# Patient Record
Sex: Male | Born: 1955 | Race: White | Hispanic: No | Marital: Married | Smoking: Never smoker
Health system: Southern US, Community
[De-identification: ages and names within clinical notes are randomized; demographics above are authoritative.]

## PROBLEM LIST (undated history)

## (undated) DIAGNOSIS — T7840XA Allergy, unspecified, initial encounter: Secondary | ICD-10-CM

## (undated) DIAGNOSIS — K219 Gastro-esophageal reflux disease without esophagitis: Secondary | ICD-10-CM

## (undated) DIAGNOSIS — M1990 Unspecified osteoarthritis, unspecified site: Secondary | ICD-10-CM

## (undated) DIAGNOSIS — J329 Chronic sinusitis, unspecified: Secondary | ICD-10-CM

## (undated) HISTORY — PX: JOINT REPLACEMENT: SHX530

## (undated) HISTORY — DX: Gastro-esophageal reflux disease without esophagitis: K21.9

## (undated) HISTORY — DX: Allergy, unspecified, initial encounter: T78.40XA

## (undated) HISTORY — PX: EYE SURGERY: SHX253

## (undated) HISTORY — PX: HERNIA REPAIR: SHX51

## (undated) HISTORY — DX: Unspecified osteoarthritis, unspecified site: M19.90

## (undated) HISTORY — PX: LASIK: SHX215

---

## 1978-05-26 HISTORY — PX: OTHER SURGICAL HISTORY: SHX169

## 2012-03-19 ENCOUNTER — Encounter: Payer: Self-pay | Admitting: Family Medicine

## 2012-03-19 ENCOUNTER — Ambulatory Visit (INDEPENDENT_AMBULATORY_CARE_PROVIDER_SITE_OTHER): Payer: BC Managed Care – PPO | Admitting: Family Medicine

## 2012-03-19 VITALS — BP 118/80 | HR 84 | Temp 98.5°F | Ht 70.5 in | Wt 219.0 lb

## 2012-03-19 DIAGNOSIS — Z13220 Encounter for screening for lipoid disorders: Secondary | ICD-10-CM

## 2012-03-19 DIAGNOSIS — R0982 Postnasal drip: Secondary | ICD-10-CM

## 2012-03-19 DIAGNOSIS — Z131 Encounter for screening for diabetes mellitus: Secondary | ICD-10-CM

## 2012-03-19 DIAGNOSIS — M1710 Unilateral primary osteoarthritis, unspecified knee: Secondary | ICD-10-CM

## 2012-03-19 DIAGNOSIS — M170 Bilateral primary osteoarthritis of knee: Secondary | ICD-10-CM | POA: Insufficient documentation

## 2012-03-19 DIAGNOSIS — Z7689 Persons encountering health services in other specified circumstances: Secondary | ICD-10-CM

## 2012-03-19 DIAGNOSIS — E669 Obesity, unspecified: Secondary | ICD-10-CM | POA: Insufficient documentation

## 2012-03-19 DIAGNOSIS — Z7189 Other specified counseling: Secondary | ICD-10-CM

## 2012-03-19 DIAGNOSIS — Z23 Encounter for immunization: Secondary | ICD-10-CM

## 2012-03-19 HISTORY — DX: Postnasal drip: R09.82

## 2012-03-19 HISTORY — DX: Bilateral primary osteoarthritis of knee: M17.0

## 2012-03-19 LAB — LIPID PANEL
HDL: 37.6 mg/dL — ABNORMAL LOW (ref >39.00)
Total CHOL/HDL Ratio: 5
Triglycerides: 139 mg/dL (ref 0.0–149.0)
VLDL: 27.8 mg/dL (ref 0.0–40.0)

## 2012-03-19 LAB — HEMOGLOBIN A1C: Hgb A1c MFr Bld: 5.2 % (ref 4.6–6.5)

## 2012-03-19 MED ORDER — FLUTICASONE PROPIONATE 50 MCG/ACT NA SUSP: 2.0000 | Freq: Every day | NASAL | Status: DC

## 2012-03-19 NOTE — Progress Notes (Signed)
Chief Complaint  Patient presents with  . Establish Care    HPI: Marc Krueger is here to establish care. In the process of moving here from [place].   Has the following concerns today:  L knee OA: -saw ortho in [place] and followed for years -has had multiple synvisc injections -wants referral to ortho here  Cough: -started 2-3 weeks ago -had URI/sinusitis and tx with abx - just finished -feels much better, but still has a mild cough -no heartburn or dysphagia, fevers, chills, unintentional weight loss  Exercise: 3-4 days per week Diet: eating healthy and has lost weight with diet and exercise  Wants flu vaccine and tdap.   ROS: See pertinent positives and negatives per HPI.  Past Medical History  Diagnosis Date  . Allergy   . Arthritis   . Depression     Family History  Problem Relation Age of Onset  . Arthritis Mother   . Heart disease Mother     History   Social History  . Marital Status: Married    Spouse Name: N/A    Number of Children: N/A  . Years of Education: N/A   Social History Main Topics  . Smoking status: Never Smoker   . Smokeless tobacco: None  . Alcohol Use: Yes     1 per week  . Drug Use: None  . Sexually Active: None   Other Topics Concern  . None   Social History Narrative  . None    Current outpatient prescriptions:diclofenac (VOLTAREN) 50 MG EC tablet, Take 50 mg by mouth 3 (three) times daily., Disp: , Rfl: ;  fluticasone (FLONASE) 50 MCG/ACT nasal spray, Place 2 sprays into the nose daily., Disp: 16 g, Rfl: 1;  Multiple Vitamin (MULTIVITAMIN) tablet, Take 1 tablet by mouth daily., Disp: , Rfl:   EXAM:  Filed Vitals:   03/19/12 1301  BP: 118/80  Pulse: 84  Temp: 98.5 F (36.9 C)    Body mass index is 30.98 kg/(m^2).  GENERAL: vitals reviewed and listed above, alert, oriented, appears well hydrated and in no acute distress  HEENT: atraumatic, conjunttiva clear, no obvious abnormalities on inspection of external  nose and ears, normal appearance of ear canals and TMs, clear nasal congestion, mild post oropharyngeal erythema with PND, no tonsillar edema or exudate, no sinus TTP  NECK: no obvious masses on inspection  LUNGS: clear to auscultation bilaterally, no wheezes, rales or rhonchi, good air movement  CV: HRRR, no peripheral edema  MS: moves all extremities without noticeable abnormality  PSYCH: pleasant and cooperative, no obvious depression or anxiety  ASSESSMENT AND PLAN:  Discussed the following assessment and plan:  1. Encounter to establish care with new doctor    2. Obesity (BMI 30.0-34.9)    3. Screening for diabetes mellitus  Hemoglobin A1c  4. Screening for hyperlipidemia  Lipid Panel  5. Post-nasal drip  fluticasone (FLONASE) 50 MCG/ACT nasal spray  6. Osteoarthritis of both knees  Ambulatory referral to Orthopedic Surgery   -We reviewed the PMH, PSH, FH, SH, Meds and Allergies. -We provided refills for any medications we will prescribe as needed. -We addressed current concerns per orders and patient instructions. -We have asked for records for pertinent exams, studies, vaccines and notes from previous providers. -We have advised patient to follow up per instructions below. -Influenza and tdap vaccine given today  -Patient advised to return or notify a doctor immediately if symptoms worsen or persist or new concerns arise.  Patient Instructions  -We have  ordered labs or studies at this visit. It can take up to 1-2 weeks for results and processing. We will contact you with instructions IF your results are abnormal. Normal results will be released to your [facility]. If you have not heard from [place] or can not find your results in [facility] in 2 weeks please contact our office.  -PLEASE SIGN UP FOR MYCHART TODAY   We recommend the following healthy lifestyle measures: - eat a healthy diet consisting of lots of vegetables, fruits, beans, nuts, seeds, healthy meats such as white  chicken and fish and whole grains.  - avoid fried foods, fast food, processed foods, sodas, red meet and other fattening foods.  - get a least 150 minutes of aerobic exercise per week.   Follow up in:      Kriste Basque R.

## 2012-03-19 NOTE — Patient Instructions (Addendum)
-  We have ordered labs or studies at this visit. It can take up to 1-2 weeks for results and processing. We will contact you with instructions IF your results are abnormal. Normal results will be released to your [facility]. If you have not heard from [place] or can not find your results in [facility] in 2 weeks please contact our office.  -PLEASE SIGN UP FOR MYCHART TODAY   -We placed a referral for you as discussed. It usually takes about 1-2 weeks to process and schedule this referral. If you have not heard from [place] regarding this appointment in 2 weeks please contact our office.  -for our cough:  Use flonase as directed Nasal saline 2 times daily Humidifier at night Follow up with me if not resolved in 3 weeks  We recommend the following healthy lifestyle measures: - eat a healthy diet consisting of lots of vegetables, fruits, beans, nuts, seeds, healthy meats such as white chicken and fish and whole grains.  - avoid fried foods, fast food, processed foods, sodas, red meet and other fattening foods.  - get a least 150 minutes of aerobic exercise per week.   Follow up in: April for yearly physical

## 2012-03-19 NOTE — Addendum Note (Signed)
Addended by: Azucena Freed on: 03/19/2012 01:48 PM   Modules accepted: Orders

## 2012-03-20 ENCOUNTER — Telehealth: Payer: Self-pay | Admitting: Family Medicine

## 2012-03-20 NOTE — Telephone Encounter (Signed)
Please let patient know, since not yet signed up for mychart, wanted to contact regarding lab results. Recommend signing up for mychart to see these results in about 10 days.  -cholesterol is a bit high -diabetes screening lab is a little high (>5.6) indicating a risk for developing diabetes  The best treatment to hopefully reverse these findings and prevent adverse health outcomes is a healthy diet and regular exercise.  Schedule follow up in about 3-4 months if not already scheduled to do so.  

## 2012-03-22 ENCOUNTER — Telehealth: Payer: Self-pay | Admitting: Family Medicine

## 2012-03-22 NOTE — Telephone Encounter (Signed)
Left a message for pt to return call 

## 2012-03-22 NOTE — Telephone Encounter (Signed)
Returning your call. °

## 2012-03-22 NOTE — Telephone Encounter (Signed)
Called and spoke with pt about labs.  See previous note.

## 2012-03-22 NOTE — Telephone Encounter (Signed)
Called and spoke with pt about labs and pt is aware.  

## 2012-09-02 ENCOUNTER — Encounter: Payer: Self-pay | Admitting: Family Medicine

## 2012-09-02 ENCOUNTER — Ambulatory Visit (INDEPENDENT_AMBULATORY_CARE_PROVIDER_SITE_OTHER): Payer: BC Managed Care – PPO | Admitting: Family Medicine

## 2012-09-02 VITALS — BP 128/70 | HR 103 | Temp 98.6°F | Wt 218.0 lb

## 2012-09-02 DIAGNOSIS — R0982 Postnasal drip: Secondary | ICD-10-CM

## 2012-09-02 DIAGNOSIS — IMO0002 Reserved for concepts with insufficient information to code with codable children: Secondary | ICD-10-CM

## 2012-09-02 DIAGNOSIS — M170 Bilateral primary osteoarthritis of knee: Secondary | ICD-10-CM

## 2012-09-02 DIAGNOSIS — E785 Hyperlipidemia, unspecified: Secondary | ICD-10-CM

## 2012-09-02 DIAGNOSIS — H8110 Benign paroxysmal vertigo, unspecified ear: Secondary | ICD-10-CM

## 2012-09-02 DIAGNOSIS — J309 Allergic rhinitis, unspecified: Secondary | ICD-10-CM

## 2012-09-02 DIAGNOSIS — M1710 Unilateral primary osteoarthritis, unspecified knee: Secondary | ICD-10-CM

## 2012-09-02 MED ORDER — FLUTICASONE PROPIONATE 50 MCG/ACT NA SUSP: 2.0000 | Freq: Every day | NASAL | Status: DC

## 2012-09-02 NOTE — Progress Notes (Signed)
Chief Complaint  Patient presents with  . Follow-up  . sinus pressure    HPI:  Follow up:  HLD: Lab Results  Component Value Date   CHOL 177 03/19/2012   HDL 37.60* 03/19/2012   LDLCALC 112* 03/19/2012   TRIG 139.0 03/19/2012   CHOLHDL 5 03/19/2012  -not on medications -advised diet and exercise   Obesity: -was working on diet and exercise last visit -going to the gym 3-4 days per week and doing cardio and lifting -watching diet as well  Sinus issues/Vertigo: -everyone sick at work -he has had nasal congestion and nasal itching, has restarted flonase, but not taking his claritin -dizziness only happens with certain movements of head and is brief -has past hx of BPPV treated with exercises -denies: fevers, sinus pain, chills  OA knees: -saw ortho, considering surgery   ROS: See pertinent positives and negatives per HPI.  Past Medical History  Diagnosis Date  . Allergy   . Arthritis   . Depression     Family History  Problem Relation Age of Onset  . Arthritis Mother   . Heart disease Mother     History   Social History  . Marital Status: Married    Spouse Name: N/A    Number of Children: N/A  . Years of Education: N/A   Social History Main Topics  . Smoking status: Never Smoker   . Smokeless tobacco: None  . Alcohol Use: Yes     Comment: 1 per week  . Drug Use: None  . Sexually Active: None   Other Topics Concern  . None   Social History Narrative  . None    Current outpatient prescriptions:diclofenac (VOLTAREN) 50 MG EC tablet, Take 50 mg by mouth 3 (three) times daily., Disp: , Rfl: ;  fluticasone (FLONASE) 50 MCG/ACT nasal spray, Place 2 sprays into the nose daily., Disp: 16 g, Rfl: 1;  Multiple Vitamin (MULTIVITAMIN) tablet, Take 1 tablet by mouth daily., Disp: , Rfl:   EXAM:  Filed Vitals:   09/02/12 1300  BP: 128/70  Pulse: 103  Temp: 98.6 F (37 C)    Body mass index is 30.83 kg/(m^2).  GENERAL: vitals reviewed and listed  above, alert, oriented, appears well hydrated and in no acute distress  HEENT: atraumatic, PERRLA, conjunttiva clear, no obvious abnormalities on inspection of external nose and ears, normal appearance of ear canals and TMs but with bilat clear effusion, clear nasal congestion, mild post oropharyngeal erythema with PND and cobblestoning, no tonsillar edema or exudate, no sinus TTP  NECK: no obvious masses on inspection  LUNGS: clear to auscultation bilaterally, no wheezes, rales or rhonchi, good air movement  CV: HRRR, no peripheral edema  MS: moves all extremities without noticeable abnormality  Neuro: reproduced symptoms with dix hallpike to L w/ rot nystagmus  PSYCH: pleasant and cooperative, no obvious depression or anxiety  ASSESSMENT AND PLAN:  Discussed the following assessment and plan:  BPPV (benign paroxysmal positional vertigo) - Plan: Ambulatory referral to Physical Therapy  Post-nasal drip - Plan: fluticasone (FLONASE) 50 MCG/ACT nasal spray  Osteoarthritis of both knees  Allergic rhinitis - Plan: fluticasone (FLONASE) 50 MCG/ACT nasal spray  Hyperlipemia   -continued lifestyle changes for mild HLD and obesity  - congratulated on work -flonase and claritin daily for allergies, effusion - follow up in 1 month -epley exercises and referred to PT for vestibular rehab for BPPV recurrence -follow up 1 month -Patient advised to return or notify a doctor immediately if  symptoms worsen or persist or new concerns arise.  There are no Patient Instructions on file for this visit.   Marc Benton R.

## 2012-11-24 ENCOUNTER — Encounter (HOSPITAL_COMMUNITY): Payer: Self-pay | Admitting: Pharmacy Technician

## 2012-12-01 ENCOUNTER — Other Ambulatory Visit: Payer: Self-pay | Admitting: Orthopedic Surgery

## 2012-12-02 ENCOUNTER — Encounter (HOSPITAL_COMMUNITY)
Admission: RE | Admit: 2012-12-02 | Discharge: 2012-12-02 | Disposition: A | Discharge status: Home / Self Care | Payer: BC Managed Care – PPO | Source: Ambulatory Visit | Attending: Orthopedic Surgery | Admitting: Orthopedic Surgery

## 2012-12-02 ENCOUNTER — Encounter (HOSPITAL_COMMUNITY): Payer: Self-pay

## 2012-12-02 HISTORY — DX: Chronic sinusitis, unspecified: J32.9

## 2012-12-02 LAB — BASIC METABOLIC PANEL WITH GFR
BUN: 18 mg/dL (ref 6–23)
CO2: 23 meq/L (ref 19–32)
Calcium: 9.4 mg/dL (ref 8.4–10.5)
Chloride: 104 meq/L (ref 96–112)
Creatinine, Ser: 1.16 mg/dL (ref 0.50–1.35)
GFR calc Af Amer: 80 mL/min — ABNORMAL LOW
GFR calc non Af Amer: 69 mL/min — ABNORMAL LOW
Glucose, Bld: 115 mg/dL — ABNORMAL HIGH (ref 70–99)
Potassium: 4 meq/L (ref 3.5–5.1)
Sodium: 138 meq/L (ref 135–145)

## 2012-12-02 LAB — URINALYSIS, ROUTINE W REFLEX MICROSCOPIC
Bilirubin Urine: NEGATIVE
Glucose, UA: NEGATIVE mg/dL
Hgb urine dipstick: NEGATIVE
Ketones, ur: NEGATIVE mg/dL
Protein, ur: NEGATIVE mg/dL
Urobilinogen, UA: 0.2 mg/dL (ref 0.0–1.0)

## 2012-12-02 LAB — CBC
HCT: 41.5 % (ref 39.0–52.0)
Hemoglobin: 14.6 g/dL (ref 13.0–17.0)
MCH: 31.8 pg (ref 26.0–34.0)
MCHC: 35.2 g/dL (ref 30.0–36.0)
MCV: 90.4 fL (ref 78.0–100.0)
Platelets: 250 K/uL (ref 150–400)
RBC: 4.59 MIL/uL (ref 4.22–5.81)
RDW: 12.9 % (ref 11.5–15.5)
WBC: 7.6 K/uL (ref 4.0–10.5)

## 2012-12-02 LAB — SURGICAL PCR SCREEN
MRSA, PCR: NEGATIVE
Staphylococcus aureus: NEGATIVE

## 2012-12-02 LAB — APTT: aPTT: 29 seconds (ref 24–37)

## 2012-12-02 LAB — PROTIME-INR: Prothrombin Time: 13.1 seconds (ref 11.6–15.2)

## 2012-12-02 NOTE — Patient Instructions (Addendum)
20 Hanif Radin  12/02/2012   Your procedure is scheduled on: 7-15  -2014  Report to [facility] at      1045  AM.  Call this number if you have problems the morning of surgery: [phone]  Or Presurgical Testing [phone]([facility])    Do not eat food:After Midnight.  May have clear liquids:up to 6 Hours before arrival. Nothing after :0730 AM  Clear liquids include soda, tea, black coffee, apple or grape juice, broth.  Take these medicines the morning of surgery with A SIP OF WATER: Loratadine. Flonase Spray if needed/bring.   Do not wear jewelry, make-up or nail polish.  Do not wear lotions, powders, or perfumes. You may wear deodorant.  Do not shave 12 hours prior to first CHG shower(legs and under arms).(face and neck okay.)  Do not bring valuables to the hospital.  Contacts, dentures or bridgework,body piercing,  may not be worn into surgery.  Leave suitcase in the car. After surgery it may be brought to your room.  For patients admitted to the hospital, checkout time is 11:00 AM the day of discharge.   Patients discharged the day of surgery will not be allowed to drive home. Must have responsible person with you x 24 hours once discharged.  Name and phone number of your driver: Vera-spouse [phone] cell  Special Instructions: CHG(Chlorhedine 4%-"Hibiclens","Betasept","Aplicare") Shower Use Special Wash: see special instructions.(avoid face and genitals)   Please read over the following fact sheets that you were given: MRSA Information, Blood Transfusion fact sheet, Incentive Spirometry Instruction.    Failure to follow these instructions may result in Cancellation of your surgery.   Patient signature_______________________________________________________

## 2012-12-07 ENCOUNTER — Inpatient Hospital Stay (HOSPITAL_COMMUNITY): Payer: BC Managed Care – PPO | Admitting: *Deleted

## 2012-12-07 ENCOUNTER — Inpatient Hospital Stay (HOSPITAL_COMMUNITY)
Admission: RE | Admit: 2012-12-07 | Discharge: 2012-12-09 | DRG: 209 | Disposition: A | Discharge status: Home Health | Payer: BC Managed Care – PPO | Source: Ambulatory Visit | Attending: Orthopedic Surgery | Admitting: Orthopedic Surgery

## 2012-12-07 ENCOUNTER — Encounter (HOSPITAL_COMMUNITY)
Admission: RE | Disposition: A | Discharge status: Home Health | Payer: Self-pay | Source: Ambulatory Visit | Attending: Orthopedic Surgery

## 2012-12-07 ENCOUNTER — Encounter (HOSPITAL_COMMUNITY): Payer: Self-pay | Admitting: *Deleted

## 2012-12-07 DIAGNOSIS — Z96659 Presence of unspecified artificial knee joint: Secondary | ICD-10-CM

## 2012-12-07 DIAGNOSIS — Z96652 Presence of left artificial knee joint: Secondary | ICD-10-CM

## 2012-12-07 DIAGNOSIS — Z6830 Body mass index (BMI) 30.0-30.9, adult: Secondary | ICD-10-CM

## 2012-12-07 DIAGNOSIS — M1710 Unilateral primary osteoarthritis, unspecified knee: Principal | ICD-10-CM | POA: Diagnosis present

## 2012-12-07 DIAGNOSIS — D62 Acute posthemorrhagic anemia: Secondary | ICD-10-CM | POA: Diagnosis not present

## 2012-12-07 DIAGNOSIS — E669 Obesity, unspecified: Secondary | ICD-10-CM | POA: Diagnosis present

## 2012-12-07 DIAGNOSIS — M21869 Other specified acquired deformities of unspecified lower leg: Secondary | ICD-10-CM | POA: Diagnosis present

## 2012-12-07 DIAGNOSIS — Z79899 Other long term (current) drug therapy: Secondary | ICD-10-CM

## 2012-12-07 DIAGNOSIS — D500 Iron deficiency anemia secondary to blood loss (chronic): Secondary | ICD-10-CM | POA: Diagnosis not present

## 2012-12-07 HISTORY — DX: Presence of unspecified artificial knee joint: Z96.659

## 2012-12-07 HISTORY — PX: TOTAL KNEE ARTHROPLASTY: SHX125

## 2012-12-07 LAB — TYPE AND SCREEN: ABO/RH(D): O NEG

## 2012-12-07 LAB — ABO/RH: ABO/RH(D): O NEG

## 2012-12-07 SURGERY — ARTHROPLASTY, KNEE, TOTAL
Anesthesia: General | Site: Knee | Laterality: Left | Wound class: Clean

## 2012-12-07 MED ORDER — CELECOXIB 200 MG PO CAPS
200.0000 mg | ORAL_CAPSULE | Freq: Two times a day (BID) | ORAL | Status: DC
  Administered 2012-12-07 – 2012-12-09 (×4): 200 mg via ORAL
  Filled 2012-12-07 (×5): qty 1

## 2012-12-07 MED ORDER — CHLORHEXIDINE GLUCONATE 4 % EX LIQD
60.0000 mL | Freq: Once | CUTANEOUS | Status: DC
  Filled 2012-12-07: qty 60

## 2012-12-07 MED ORDER — METHOCARBAMOL 100 MG/ML IJ SOLN
500.0000 mg | Freq: Four times a day (QID) | INTRAVENOUS | Status: DC | PRN
  Filled 2012-12-07: qty 5

## 2012-12-07 MED ORDER — ONDANSETRON HCL 4 MG/2ML IJ SOLN
INTRAMUSCULAR | Status: DC | PRN
  Administered 2012-12-07: 4 mg via INTRAVENOUS

## 2012-12-07 MED ORDER — NEOSTIGMINE METHYLSULFATE 1 MG/ML IJ SOLN
INTRAMUSCULAR | Status: DC | PRN
  Administered 2012-12-07: 3 mg via INTRAVENOUS

## 2012-12-07 MED ORDER — ZOLPIDEM TARTRATE 5 MG PO TABS: 5.0000 mg | ORAL_TABLET | Freq: Every evening | ORAL | Status: DC | PRN

## 2012-12-07 MED ORDER — FERROUS SULFATE 325 (65 FE) MG PO TABS
325.0000 mg | ORAL_TABLET | Freq: Three times a day (TID) | ORAL | Status: DC
  Administered 2012-12-07 – 2012-12-09 (×3): 325 mg via ORAL
  Filled 2012-12-07 (×8): qty 1

## 2012-12-07 MED ORDER — MIDAZOLAM HCL 5 MG/5ML IJ SOLN
INTRAMUSCULAR | Status: DC | PRN
  Administered 2012-12-07: 2 mg via INTRAVENOUS

## 2012-12-07 MED ORDER — METOCLOPRAMIDE HCL 5 MG/ML IJ SOLN: 5.0000 mg | Freq: Three times a day (TID) | INTRAMUSCULAR | Status: DC | PRN

## 2012-12-07 MED ORDER — SODIUM CHLORIDE 0.9 % IJ SOLN
INTRAMUSCULAR | Status: DC | PRN
  Administered 2012-12-07: 14 mL

## 2012-12-07 MED ORDER — HYDROMORPHONE HCL PF 1 MG/ML IJ SOLN: 0.2500 mg | INTRAMUSCULAR | Status: DC | PRN

## 2012-12-07 MED ORDER — LIDOCAINE HCL (CARDIAC) 20 MG/ML IV SOLN
INTRAVENOUS | Status: DC | PRN
  Administered 2012-12-07: 50 mg via INTRAVENOUS

## 2012-12-07 MED ORDER — HYDROMORPHONE HCL PF 1 MG/ML IJ SOLN
INTRAMUSCULAR | Status: DC | PRN
  Administered 2012-12-07 (×2): 0.5 mg via INTRAVENOUS
  Administered 2012-12-07: 1 mg via INTRAVENOUS

## 2012-12-07 MED ORDER — ONDANSETRON HCL 4 MG/2ML IJ SOLN: 4.0000 mg | Freq: Four times a day (QID) | INTRAMUSCULAR | Status: DC | PRN

## 2012-12-07 MED ORDER — LORATADINE 10 MG PO TABS
10.0000 mg | ORAL_TABLET | Freq: Every day | ORAL | Status: DC
  Administered 2012-12-07 – 2012-12-09 (×3): 10 mg via ORAL
  Filled 2012-12-07 (×3): qty 1

## 2012-12-07 MED ORDER — HYDROMORPHONE HCL PF 1 MG/ML IJ SOLN: 0.5000 mg | INTRAMUSCULAR | Status: DC | PRN

## 2012-12-07 MED ORDER — PROMETHAZINE HCL 25 MG/ML IJ SOLN: 6.2500 mg | INTRAMUSCULAR | Status: DC | PRN

## 2012-12-07 MED ORDER — PHENOL 1.4 % MT LIQD
1.0000 | OROMUCOSAL | Status: DC | PRN
  Filled 2012-12-07: qty 177

## 2012-12-07 MED ORDER — ROCURONIUM BROMIDE 100 MG/10ML IV SOLN
INTRAVENOUS | Status: DC | PRN
  Administered 2012-12-07: 50 mg via INTRAVENOUS

## 2012-12-07 MED ORDER — BUPIVACAINE LIPOSOME 1.3 % IJ SUSP
INTRAMUSCULAR | Status: DC | PRN
  Administered 2012-12-07: 20 mL

## 2012-12-07 MED ORDER — ONDANSETRON HCL 4 MG PO TABS: 4.0000 mg | ORAL_TABLET | Freq: Four times a day (QID) | ORAL | Status: DC | PRN

## 2012-12-07 MED ORDER — CEFAZOLIN SODIUM-DEXTROSE 2-3 GM-% IV SOLR
2.0000 g | INTRAVENOUS | Status: AC
  Administered 2012-12-07: 2 g via INTRAVENOUS

## 2012-12-07 MED ORDER — METOCLOPRAMIDE HCL 10 MG PO TABS: 5.0000 mg | ORAL_TABLET | Freq: Three times a day (TID) | ORAL | Status: DC | PRN

## 2012-12-07 MED ORDER — KETOROLAC TROMETHAMINE 30 MG/ML IJ SOLN
INTRAMUSCULAR | Status: DC | PRN
  Administered 2012-12-07: 30 mg

## 2012-12-07 MED ORDER — BIOTENE DRY MOUTH MT LIQD
15.0000 mL | Freq: Two times a day (BID) | OROMUCOSAL | Status: DC
  Administered 2012-12-08: 15 mL via OROMUCOSAL

## 2012-12-07 MED ORDER — MENTHOL 3 MG MT LOZG
1.0000 | LOZENGE | OROMUCOSAL | Status: DC | PRN
  Filled 2012-12-07: qty 9

## 2012-12-07 MED ORDER — BUPIVACAINE-EPINEPHRINE 0.25% -1:200000 IJ SOLN
INTRAMUSCULAR | Status: DC | PRN
  Administered 2012-12-07: 25 mL

## 2012-12-07 MED ORDER — ASPIRIN EC 325 MG PO TBEC
325.0000 mg | DELAYED_RELEASE_TABLET | Freq: Two times a day (BID) | ORAL | Status: DC
  Administered 2012-12-08 – 2012-12-09 (×3): 325 mg via ORAL
  Filled 2012-12-07 (×5): qty 1

## 2012-12-07 MED ORDER — POLYETHYLENE GLYCOL 3350 17 G PO PACK
17.0000 g | PACK | Freq: Two times a day (BID) | ORAL | Status: DC
  Administered 2012-12-08 (×2): 17 g via ORAL

## 2012-12-07 MED ORDER — HYDROCODONE-ACETAMINOPHEN 7.5-325 MG PO TABS
1.0000 | ORAL_TABLET | ORAL | Status: DC
  Administered 2012-12-07 – 2012-12-09 (×11): 2 via ORAL
  Filled 2012-12-07 (×11): qty 2

## 2012-12-07 MED ORDER — TRANEXAMIC ACID 100 MG/ML IV SOLN
1000.0000 mg | INTRAVENOUS | Status: AC
  Administered 2012-12-07: 1000 mg via INTRAVENOUS
  Filled 2012-12-07: qty 10

## 2012-12-07 MED ORDER — FENTANYL CITRATE 0.05 MG/ML IJ SOLN
INTRAMUSCULAR | Status: DC | PRN
  Administered 2012-12-07 (×6): 50 ug via INTRAVENOUS
  Administered 2012-12-07 (×2): 100 ug via INTRAVENOUS

## 2012-12-07 MED ORDER — BUPIVACAINE LIPOSOME 1.3 % IJ SUSP
20.0000 mL | Freq: Once | INTRAMUSCULAR | Status: DC
  Filled 2012-12-07: qty 20

## 2012-12-07 MED ORDER — BISACODYL 10 MG RE SUPP: 10.0000 mg | Freq: Every day | RECTAL | Status: DC | PRN

## 2012-12-07 MED ORDER — METHOCARBAMOL 500 MG PO TABS
500.0000 mg | ORAL_TABLET | Freq: Four times a day (QID) | ORAL | Status: DC | PRN
  Administered 2012-12-07 – 2012-12-09 (×5): 500 mg via ORAL
  Filled 2012-12-07 (×5): qty 1

## 2012-12-07 MED ORDER — DEXAMETHASONE SODIUM PHOSPHATE 10 MG/ML IJ SOLN
10.0000 mg | Freq: Once | INTRAMUSCULAR | Status: AC
Start: 2012-12-08 — End: 2012-12-08
  Administered 2012-12-08: 10 mg via INTRAVENOUS
  Filled 2012-12-07: qty 1

## 2012-12-07 MED ORDER — FLUTICASONE PROPIONATE 50 MCG/ACT NA SUSP
2.0000 | Freq: Every day | NASAL | Status: DC
  Filled 2012-12-07: qty 16

## 2012-12-07 MED ORDER — DOCUSATE SODIUM 100 MG PO CAPS
100.0000 mg | ORAL_CAPSULE | Freq: Two times a day (BID) | ORAL | Status: DC
  Administered 2012-12-07 – 2012-12-09 (×4): 100 mg via ORAL

## 2012-12-07 MED ORDER — PROPOFOL 10 MG/ML IV BOLUS
INTRAVENOUS | Status: DC | PRN
  Administered 2012-12-07: 200 mg via INTRAVENOUS

## 2012-12-07 MED ORDER — LACTATED RINGERS IV SOLN
INTRAVENOUS | Status: DC
  Administered 2012-12-07 (×3): via INTRAVENOUS

## 2012-12-07 MED ORDER — GLYCOPYRROLATE 0.2 MG/ML IJ SOLN
INTRAMUSCULAR | Status: DC | PRN
  Administered 2012-12-07: 0.4 mg via INTRAVENOUS

## 2012-12-07 MED ORDER — SODIUM CHLORIDE 0.9 % IV SOLN
INTRAVENOUS | Status: DC
  Administered 2012-12-08: 01:00:00 via INTRAVENOUS
  Filled 2012-12-07 (×6): qty 1000

## 2012-12-07 MED ORDER — DEXAMETHASONE SODIUM PHOSPHATE 10 MG/ML IJ SOLN
INTRAMUSCULAR | Status: DC | PRN
  Administered 2012-12-07: 10 mg via INTRAVENOUS

## 2012-12-07 MED ORDER — CEFAZOLIN SODIUM-DEXTROSE 2-3 GM-% IV SOLR
2.0000 g | Freq: Four times a day (QID) | INTRAVENOUS | Status: AC
  Administered 2012-12-07 – 2012-12-08 (×2): 2 g via INTRAVENOUS
  Filled 2012-12-07 (×2): qty 50

## 2012-12-07 MED ORDER — ALUM & MAG HYDROXIDE-SIMETH 200-200-20 MG/5ML PO SUSP: 30.0000 mL | ORAL | Status: DC | PRN

## 2012-12-07 MED ORDER — FLEET ENEMA 7-19 GM/118ML RE ENEM: 1.0000 | ENEMA | Freq: Once | RECTAL | Status: AC | PRN

## 2012-12-07 MED ORDER — ROPIVACAINE HCL 5 MG/ML IJ SOLN
INTRAMUSCULAR | Status: DC | PRN
  Administered 2012-12-07: 30 mL

## 2012-12-07 MED ORDER — DIPHENHYDRAMINE HCL 25 MG PO CAPS: 25.0000 mg | ORAL_CAPSULE | Freq: Four times a day (QID) | ORAL | Status: DC | PRN

## 2012-12-07 SURGICAL SUPPLY — 58 items
BAG ZIPLOCK 12X15 (MISCELLANEOUS) ×2 IMPLANT
BANDAGE ELASTIC 6 VELCRO ST LF (GAUZE/BANDAGES/DRESSINGS) ×2 IMPLANT
BANDAGE ESMARK 6X9 LF (GAUZE/BANDAGES/DRESSINGS) ×1 IMPLANT
BLADE SAW SGTL 13.0X1.19X90.0M (BLADE) ×2 IMPLANT
BNDG ESMARK 6X9 LF (GAUZE/BANDAGES/DRESSINGS) ×2
BOWL SMART MIX CTS (DISPOSABLE) ×2 IMPLANT
CAP UPCHARGE REVISION TRAY ×2 IMPLANT
CAPT RP KNEE ×2 IMPLANT
CEMENT HV SMART SET (Cement) ×4 IMPLANT
CLOTH BEACON ORANGE TIMEOUT ST (SAFETY) ×2 IMPLANT
CUFF TOURN SGL QUICK 34 (TOURNIQUET CUFF) ×1
CUFF TRNQT CYL 34X4X40X1 (TOURNIQUET CUFF) ×1 IMPLANT
DECANTER SPIKE VIAL GLASS SM (MISCELLANEOUS) ×2 IMPLANT
DERMABOND ADVANCED (GAUZE/BANDAGES/DRESSINGS) ×1
DERMABOND ADVANCED .7 DNX12 (GAUZE/BANDAGES/DRESSINGS) ×1 IMPLANT
DRAPE EXTREMITY T 121X128X90 (DRAPE) ×2 IMPLANT
DRAPE POUCH INSTRU U-SHP 10X18 (DRAPES) ×2 IMPLANT
DRAPE U-SHAPE 47X51 STRL (DRAPES) ×2 IMPLANT
DRSG AQUACEL AG ADV 3.5X10 (GAUZE/BANDAGES/DRESSINGS) ×2 IMPLANT
DRSG TEGADERM 4X4.75 (GAUZE/BANDAGES/DRESSINGS) ×2 IMPLANT
DURAPREP 26ML APPLICATOR (WOUND CARE) ×2 IMPLANT
ELECT REM PT RETURN 9FT ADLT (ELECTROSURGICAL) ×2
ELECTRODE REM PT RTRN 9FT ADLT (ELECTROSURGICAL) ×1 IMPLANT
EVACUATOR 1/8 PVC DRAIN (DRAIN) ×2 IMPLANT
FACESHIELD LNG OPTICON STERILE (SAFETY) ×12 IMPLANT
FEMUR LFT TC3 REVISION (Knees) ×2 IMPLANT
GAUZE SPONGE 2X2 8PLY STRL LF (GAUZE/BANDAGES/DRESSINGS) ×1 IMPLANT
GLOVE BIOGEL PI IND STRL 7.5 (GLOVE) ×1 IMPLANT
GLOVE BIOGEL PI IND STRL 8 (GLOVE) ×1 IMPLANT
GLOVE BIOGEL PI INDICATOR 7.5 (GLOVE) ×1
GLOVE BIOGEL PI INDICATOR 8 (GLOVE) ×1
GLOVE ECLIPSE 8.0 STRL XLNG CF (GLOVE) ×2 IMPLANT
GLOVE ORTHO TXT STRL SZ7.5 (GLOVE) ×4 IMPLANT
GOWN BRE IMP PREV XXLGXLNG (GOWN DISPOSABLE) ×6 IMPLANT
GOWN STRL NON-REIN LRG LVL3 (GOWN DISPOSABLE) ×2 IMPLANT
HANDPIECE INTERPULSE COAX TIP (DISPOSABLE) ×1
INSERT TIBIAL TC3 RP SZ5 12.5 (Knees) ×2 IMPLANT
KIT BASIN OR (CUSTOM PROCEDURE TRAY) ×2 IMPLANT
MANIFOLD NEPTUNE II (INSTRUMENTS) ×2 IMPLANT
NDL SAFETY ECLIPSE 18X1.5 (NEEDLE) ×1 IMPLANT
NEEDLE HYPO 18GX1.5 SHARP (NEEDLE) ×1
NS IRRIG 1000ML POUR BTL (IV SOLUTION) ×2 IMPLANT
PACK TOTAL JOINT (CUSTOM PROCEDURE TRAY) ×2 IMPLANT
POSITIONER SURGICAL ARM (MISCELLANEOUS) ×2 IMPLANT
SET HNDPC FAN SPRY TIP SCT (DISPOSABLE) ×1 IMPLANT
SET PAD KNEE POSITIONER (MISCELLANEOUS) ×2 IMPLANT
SPONGE GAUZE 2X2 STER 10/PKG (GAUZE/BANDAGES/DRESSINGS) ×1
SUCTION FRAZIER 12FR DISP (SUCTIONS) ×2 IMPLANT
SUT MNCRL AB 4-0 PS2 18 (SUTURE) ×2 IMPLANT
SUT VIC AB 1 CT1 36 (SUTURE) ×2 IMPLANT
SUT VIC AB 2-0 CT1 27 (SUTURE) ×3
SUT VIC AB 2-0 CT1 TAPERPNT 27 (SUTURE) ×3 IMPLANT
SUT VLOC 180 0 24IN GS25 (SUTURE) ×2 IMPLANT
SYR 50ML LL SCALE MARK (SYRINGE) ×2 IMPLANT
TOWEL OR 17X26 10 PK STRL BLUE (TOWEL DISPOSABLE) ×4 IMPLANT
TRAY FOLEY CATH 14FRSI W/METER (CATHETERS) ×2 IMPLANT
WATER STERILE IRR 1500ML POUR (IV SOLUTION) ×2 IMPLANT
WRAP KNEE MAXI GEL POST OP (GAUZE/BANDAGES/DRESSINGS) ×2 IMPLANT

## 2012-12-07 NOTE — H&P (Signed)
TOTAL KNEE ADMISSION H&P  Patient is being admitted for left total knee arthroplasty.  Subjective:  Chief Complaint: Left knee OA / pain, instability of the LCL.  HPI: Marc Krueger, 57 y.o. male, has a history of pain and functional disability in the left knee due to arthritis and has failed non-surgical conservative treatments for greater than 12 weeks to include NSAID's and/or analgesics and activity modification.  He was originally a Dr. Shelle Iron patient, but because of instability of the LCL Dr. Charlann Boxer saw the patient.  A long discussion was had regarding the procedure and doing a more constrained knee to compensate for the instability of the LCL. A discussion was also had that he potentially may need another surgery to repair the LCL.  Onset of symptoms was gradual, starting years ago with rapidlly worsening course since that time. The patient noted no past surgery on the left knee(s).  Patient currently rates pain in the left knee(s) at 8 out of 10 with activity. Patient has worsening of pain with activity and weight bearing, pain that interferes with activities of daily living, pain with passive range of motion, crepitus and joint swelling.  Patient has evidence of periarticular osteophytes and joint space narrowing by imaging studies. There is no active signs of infection.  Risks, benefits and expectations were discussed with the patient. Patient understand the risks, benefits and expectations and wishes to proceed with surgery.   D/C Plans:   Home with HHPT  Post-op Meds:    No Rx given  Tranexamic Acid:   To be given  Decadron:    To be given  FYI:    ASA post-op   Patient Active Problem List   Diagnosis Date Noted  . Obesity (BMI 30.0-34.9) 03/19/2012  . Post-nasal drip 03/19/2012  . Osteoarthritis of both knees 03/19/2012   Past Medical History  Diagnosis Date  . Allergy [id number]    seasonal alllergy  . Arthritis     osteoarthritis-knees  . Sinusitis     occ./occ. ringing in  ears    Past Surgical History  Procedure Laterality Date  . Foot sugery Bilateral 1980  . Lasik      Prescriptions prior to admission  Medication Sig Dispense Refill  . diclofenac (VOLTAREN) 50 MG EC tablet Take 50 mg by mouth daily.       . fluticasone (FLONASE) 50 MCG/ACT nasal spray Place 2 sprays into the nose daily.  16 g  1  . loratadine (CLARITIN) 10 MG tablet Take 10 mg by mouth daily.      . Multiple Vitamin (MULTIVITAMIN) tablet Take 1 tablet by mouth daily.       No Known Allergies   History  Substance Use Topics  . Smoking status: Never Smoker   . Smokeless tobacco: Not on file  . Alcohol Use: Yes     Comment: 1 per week    Family History  Problem Relation Age of Onset  . Arthritis Mother   . Heart disease Mother      Review of Systems  Constitutional: Negative.   HENT: Negative.   Eyes: Negative.   Respiratory: Negative.   Cardiovascular: Negative.   Gastrointestinal: Negative.   Musculoskeletal: Positive for joint pain.  Skin: Negative.   Neurological: Negative.   Endo/Heme/Allergies: Positive for environmental allergies.  Psychiatric/Behavioral: Negative.     Objective:  Physical Exam  Constitutional: He is oriented to person, place, and time. He appears well-developed and well-nourished.  HENT:  Head: Normocephalic and atraumatic.  Mouth/Throat: Oropharynx is clear and moist.  Eyes: Pupils are equal, round, and reactive to light.  Neck: Neck supple. No JVD present. No tracheal deviation present. No thyromegaly present.  Cardiovascular: Normal rate, regular rhythm, normal heart sounds and intact distal pulses.   Respiratory: Effort normal and breath sounds normal. No stridor. No respiratory distress. He has no wheezes.  GI: Soft. There is no tenderness. There is no guarding.  Musculoskeletal:       Left knee: He exhibits decreased range of motion, swelling and LCL laxity. He exhibits no ecchymosis and no erythema. Tenderness found. LCL tenderness  noted. No MCL tenderness noted.  Lymphadenopathy:    He has no cervical adenopathy.  Neurological: He is alert and oriented to person, place, and time.  Skin: Skin is warm and dry.  Psychiatric: He has a normal mood and affect.    Labs:  Estimated body mass index is 30.83 kg/(m^2) as calculated from the following:   Height as of 03/19/12: 5' 10.5" (1.791 m).   Weight as of 09/02/12: 98.884 kg (218 lb).   Imaging Review Plain radiographs demonstrate severe degenerative joint disease of the left knee(s). The overall alignment is  neutral. The bone quality appears to be good for age and reported activity level.  Assessment/Plan:  End stage arthritis, left knee with LCL instability  The patient history, physical examination, clinical judgment of the provider and imaging studies are consistent with end stage degenerative joint disease of the left knee(s) and total knee arthroplasty is deemed medically necessary. The treatment options including medical management, injection therapy arthroscopy and arthroplasty were discussed at length. The risks and benefits of total knee arthroplasty were presented and reviewed. The risks due to aseptic loosening, infection, stiffness, patella tracking problems, thromboembolic complications and other imponderables were discussed. The patient acknowledged the explanation, agreed to proceed with the plan and consent was signed. Patient is being admitted for inpatient treatment for surgery, pain control, PT, OT, prophylactic antibiotics, VTE prophylaxis, progressive ambulation and ADL's and discharge planning. The patient is planning to be discharged home with home health services.     Anastasio Auerbach Hetvi Shawhan   PAC  12/07/2012, 11:12 AM

## 2012-12-07 NOTE — Anesthesia Procedure Notes (Signed)
Anesthesia Regional Block:  Femoral nerve block  Pre-Anesthetic Checklist: ,, timeout performed, Correct Patient, Correct Site, Correct Laterality, Correct Procedure, Correct Position, site marked, Risks and benefits discussed,  Surgical consent,  Pre-op evaluation,  At surgeon's request and post-op pain management  Laterality: Lower and Left  Prep: chloraprep       Needles:  Injection technique: Single-shot  Needle Type: Stimiplex          Additional Needles:  Procedures: ultrasound guided (picture in chart) and nerve stimulator Femoral nerve block  Nerve Stimulator or Paresthesia:  Response: left quadriceps, 0.6 mA,   Additional Responses:   Narrative:  Start time: 12/07/2012 12:00 PM  Performed by: Personally  Anesthesiologist: Shatonya Passon  Additional Notes: No pain on injection. No increased resistance to injection. Motor intact immediately after block. Loss of quadriceps strength at 20 minutes.   Femoral nerve block

## 2012-12-07 NOTE — Anesthesia Preprocedure Evaluation (Signed)
Anesthesia Evaluation  Patient identified by MRN, date of birth, ID band Patient awake    Reviewed: Allergy & Precautions, H&P , NPO status , Patient's Chart, lab work & pertinent test results  Airway Mallampati: II TM Distance: >3 FB Neck ROM: Full    Dental no notable dental hx.    Pulmonary neg pulmonary ROS,  breath sounds clear to auscultation  Pulmonary exam normal       Cardiovascular Exercise Tolerance: Good negative cardio ROS  Rhythm:Regular Rate:Normal     Neuro/Psych negative neurological ROS  negative psych ROS   GI/Hepatic negative GI ROS, Neg liver ROS,   Endo/Other  negative endocrine ROS  Renal/GU negative Renal ROS  negative genitourinary   Musculoskeletal negative musculoskeletal ROS (+)   Abdominal (+) + obese,   Peds negative pediatric ROS (+)  Hematology negative hematology ROS (+)   Anesthesia Other Findings   Reproductive/Obstetrics negative OB ROS                           Anesthesia Physical Anesthesia Plan  ASA: II  Anesthesia Plan: General   Post-op Pain Management:    Induction: Intravenous  Airway Management Planned:   Additional Equipment:   Intra-op Plan:   Post-operative Plan: Extubation in OR  Informed Consent: I have reviewed the patients History and Physical, chart, labs and discussed the procedure including the risks, benefits and alternatives for the proposed anesthesia with the patient or authorized representative who has indicated his/her understanding and acceptance.   Dental advisory given  Plan Discussed with: CRNA  Anesthesia Plan Comments:         Anesthesia Quick Evaluation

## 2012-12-07 NOTE — Brief Op Note (Signed)
12/07/2012  2:32 PM  PATIENT:  Marc Krueger  57 y.o. male  PRE-OPERATIVE DIAGNOSIS:   LEFT KNEE OSTEOARTHRITIS with valgus deformity  POST-OPERATIVE DIAGNOSIS:  Left Knee Osteoarthritis with valgus deformity  PROCEDURE:  Procedure(s): LEFT TOTAL KNEE ARTHROPLASTY (Left)  Depuy total knee system  SURGEON:  Surgeon(s) and Role:    * Shelda Pal, MD - Primary  PHYSICIAN ASSISTANT: Lanney Gins, PA-C  ANESTHESIA:   regional and general  EBL:  Total I/O In: 1000 [I.V.:1000] Out: 250 [Urine:150; Blood:100]  BLOOD ADMINISTERED:none  DRAINS: (1 medium) Hemovact drain(s) in the left knee with  Suction Open   LOCAL MEDICATIONS USED:  OTHER exparel combination  SPECIMEN:  No Specimen  DISPOSITION OF SPECIMEN:  N/A  COUNTS:  YES  TOURNIQUET:   Total Tourniquet Time Documented: Thigh (Left) - 52 minutes Total: Thigh (Left) - 52 minutes   DICTATION: .Other Dictation: Dictation Number [id number]  PLAN OF CARE: Admit to inpatient   PATIENT DISPOSITION:  PACU - hemodynamically stable.   Delay start of Pharmacological VTE agent (>24hrs) due to surgical blood loss or risk of bleeding: no

## 2012-12-07 NOTE — Anesthesia Postprocedure Evaluation (Signed)
  Anesthesia Post-op Note  Patient: Marc Krueger  Procedure(s) Performed: Procedure(s) (LRB): LEFT TOTAL KNEE ARTHROPLASTY (Left)  Patient Location: PACU  Anesthesia Type: GA combined with regional for post-op pain  Level of Consciousness: awake and alert   Airway and Oxygen Therapy: Patient Spontanous Breathing  Post-op Pain: mild  Post-op Assessment: Post-op Vital signs reviewed, Patient's Cardiovascular Status Stable, Respiratory Function Stable, Patent Airway and No signs of Nausea or vomiting  Last Vitals:  Filed Vitals:   12/07/12 1600  BP: 152/70  Pulse: 96  Temp:   Resp: 13    Post-op Vital Signs: stable   Complications: No apparent anesthesia complications. Moving left foot.

## 2012-12-07 NOTE — Interval H&P Note (Signed)
History and Physical Interval Note:  12/07/2012 12:29 PM  Marc Krueger  has presented today for surgery, with the diagnosis of  LEFT KNEE OSTEOARTHRITIS AND LATERAL CALETERAL LIGAMENT DISRUPTION  The various methods of treatment have been discussed with the patient and family. After consideration of risks, benefits and other options for treatment, the patient has consented to  Procedure(s): LEFT TOTAL KNEE ARTHROPLASTY (Left) as a surgical intervention .  The patient's history has been reviewed, patient examined, no change in status, stable for surgery.  I have reviewed the patient's chart and labs.  Questions were answered to the patient's satisfaction.     Shelda Pal

## 2012-12-07 NOTE — Transfer of Care (Signed)
Immediate Anesthesia Transfer of Care Note  Patient: Marc Krueger  Procedure(s) Performed: Procedure(s): LEFT TOTAL KNEE ARTHROPLASTY (Left)  Patient Location: PACU  Anesthesia Type:General  Level of Consciousness: awake, alert , sedated and patient cooperative  Airway & Oxygen Therapy: Patient Spontanous Breathing and Patient connected to face mask oxygen  Post-op Assessment: Report given to PACU RN, Post -op Vital signs reviewed and stable and Patient moving all extremities  Post vital signs: Reviewed and stable  Complications: No apparent anesthesia complications

## 2012-12-08 ENCOUNTER — Encounter (HOSPITAL_COMMUNITY): Payer: Self-pay | Admitting: Orthopedic Surgery

## 2012-12-08 DIAGNOSIS — E669 Obesity, unspecified: Secondary | ICD-10-CM | POA: Diagnosis present

## 2012-12-08 DIAGNOSIS — D500 Iron deficiency anemia secondary to blood loss (chronic): Secondary | ICD-10-CM | POA: Diagnosis not present

## 2012-12-08 LAB — BASIC METABOLIC PANEL
BUN: 21 mg/dL (ref 6–23)
Calcium: 8.9 mg/dL (ref 8.4–10.5)
Creatinine, Ser: 1.12 mg/dL (ref 0.50–1.35)
GFR calc non Af Amer: 72 mL/min — ABNORMAL LOW (ref >90)
Glucose, Bld: 153 mg/dL — ABNORMAL HIGH (ref 70–99)

## 2012-12-08 LAB — CBC
HCT: 36.7 % — ABNORMAL LOW (ref 39.0–52.0)
Hemoglobin: 12.8 g/dL — ABNORMAL LOW (ref 13.0–17.0)
MCH: 31.8 pg (ref 26.0–34.0)
MCHC: 34.9 g/dL (ref 30.0–36.0)

## 2012-12-08 MED ORDER — POLYETHYLENE GLYCOL 3350 17 G PO PACK: 17.0000 g | PACK | Freq: Two times a day (BID) | ORAL | Status: DC

## 2012-12-08 MED ORDER — HYDROCODONE-ACETAMINOPHEN 7.5-325 MG PO TABS: 1.0000 | ORAL_TABLET | ORAL | Status: DC

## 2012-12-08 MED ORDER — FERROUS SULFATE 325 (65 FE) MG PO TABS: 325.0000 mg | ORAL_TABLET | Freq: Three times a day (TID) | ORAL | Status: DC

## 2012-12-08 MED ORDER — DSS 100 MG PO CAPS: 100.0000 mg | ORAL_CAPSULE | Freq: Two times a day (BID) | ORAL | Status: DC

## 2012-12-08 MED ORDER — ASPIRIN 325 MG PO TBEC: 325.0000 mg | DELAYED_RELEASE_TABLET | Freq: Two times a day (BID) | ORAL | Status: AC

## 2012-12-08 MED ORDER — METHOCARBAMOL 500 MG PO TABS: 500.0000 mg | ORAL_TABLET | Freq: Four times a day (QID) | ORAL | Status: DC | PRN

## 2012-12-08 NOTE — Evaluation (Signed)
Physical Therapy Evaluation Patient Details Name: Marc Krueger MRN: [id number] DOB: 10-22-1955 Today's Date: 12/08/2012 Time: 4098-1191 PT Time Calculation (min): 32 min  PT Assessment / Plan / Recommendation History of Present Illness     Clinical Impression  Pt s/p L TKR presents with decreased L LE strength/ROM and post op pain limiting functional mobility   PT Assessment  Patient needs continued PT services    Follow Up Recommendations  Home health PT    Does the patient have the potential to tolerate intense rehabilitation      Barriers to Discharge        Equipment Recommendations  Rolling walker with 5" wheels    Recommendations for Other Services OT consult   Frequency 7X/week    Precautions / Restrictions Precautions Precautions: Knee;Fall Restrictions Weight Bearing Restrictions: No   Pertinent Vitals/Pain 4/10; premed, cold packs provided      Mobility  Bed Mobility Bed Mobility: Supine to Sit Supine to Sit: 4: Min assist Details for Bed Mobility Assistance: cues for sequence and min assist for L LE Transfers Transfers: Sit to Stand;Stand to Sit Sit to Stand: 4: Min assist Stand to Sit: 4: Min assist Details for Transfer Assistance: cues for transition position, use of UEs and LE managment Ambulation/Gait Ambulation/Gait Assistance: 4: Min assist Ambulation Distance (Feet): 75 Feet Assistive device: Rolling walker Ambulation/Gait Assistance Details: cues for sequence, posture, position from RW and increased heel contact R LE Gait Pattern: Step-to pattern;Antalgic;Trunk flexed    Exercises Total Joint Exercises Ankle Circles/Pumps: AROM;15 reps;Supine;Both Quad Sets: AROM;10 reps;Supine;Both Heel Slides: AAROM;Supine;15 reps;Left Straight Leg Raises: AAROM;Left;10 reps;Supine   PT Diagnosis: Difficulty walking  PT Problem List: Decreased strength;Decreased range of motion;Decreased activity tolerance;Decreased mobility;Decreased knowledge of  use of DME;Pain PT Treatment Interventions: DME instruction;Gait training;Stair training;Functional mobility training;Therapeutic activities;Therapeutic exercise;Patient/family education     PT Goals(Current goals can be found in the care plan section) Acute Rehab PT Goals Patient Stated Goal: Resume previous lifestyle with decreased pain PT Goal Formulation: With patient Time For Goal Achievement: 12/15/12 Potential to Achieve Goals: Good  Visit Information  Last PT Received On: 12/08/12 Assistance Needed: +1       Prior Functioning  Home Living Family/patient expects to be discharged to:: Private residence Living Arrangements: Spouse/significant other;Children Available Help at Discharge: Family Type of Home: House Home Access: Stairs to enter Secretary/administrator of Steps: 6 Entrance Stairs-Rails: Right;Left Home Layout: One level Home Equipment: None Prior Function Level of Independence: Independent Communication Communication: No difficulties    Cognition  Cognition Arousal/Alertness: Awake/alert Behavior During Therapy: WFL for tasks assessed/performed Overall Cognitive Status: Within Functional Limits for tasks assessed    Extremity/Trunk Assessment Upper Extremity Assessment Upper Extremity Assessment: Overall WFL for tasks assessed Lower Extremity Assessment Lower Extremity Assessment: LLE deficits/detail LLE Deficits / Details: quads 3-/5 with AAROM at knee -10 - 80   Balance    End of Session PT - End of Session Equipment Utilized During Treatment: Gait belt Activity Tolerance: Patient tolerated treatment well Patient left: in chair;with call bell/phone within reach;with family/visitor present Nurse Communication: Mobility status  GP     Marc Krueger 12/08/2012, 12:40 PM

## 2012-12-08 NOTE — Progress Notes (Signed)
Physical Therapy Treatment Patient Details Name: Gurvir Schrom MRN: [id number] DOB: 21-Jul-1955 Today's Date: 12/08/2012 Time: 4132-4401 PT Time Calculation (min): 38 min  PT Assessment / Plan / Recommendation  PT Comments     Follow Up Recommendations  Home health PT     Does the patient have the potential to tolerate intense rehabilitation     Barriers to Discharge        Equipment Recommendations  Rolling walker with 5" wheels    Recommendations for Other Services OT consult  Frequency 7X/week   Progress towards PT Goals Progress towards PT goals: Progressing toward goals  Plan Current plan remains appropriate    Precautions / Restrictions Precautions Precautions: Knee;Fall Restrictions Weight Bearing Restrictions: No Other Position/Activity Restrictions: WBAT   Pertinent Vitals/Pain 4-5/10: premed, ice pack provided    Mobility  Bed Mobility Bed Mobility: Sit to Supine Sit to Supine: 4: Min guard Details for Bed Mobility Assistance: pt self assisting L LE with R LE Transfers Transfers: Sit to Stand;Stand to Sit Sit to Stand: 4: Min guard Stand to Sit: 4: Min guard Details for Transfer Assistance: cues for transition position, use of UEs and LE managment Ambulation/Gait Ambulation/Gait Assistance: 4: Min guard Ambulation Distance (Feet): 200 Feet (and 20') Assistive device: Rolling walker Ambulation/Gait Assistance Details: cues for stride length, posture, position from RW and sequence Gait Pattern: Step-to pattern;Antalgic;Trunk flexed General Gait Details: [name]    Exercises Total Joint Exercises Ankle Circles/Pumps: AROM;15 reps;Supine;Both Quad Sets: AROM;10 reps;Supine;Both Heel Slides: AAROM;Supine;15 reps;Left Straight Leg Raises: AAROM;Left;10 reps;Supine;AROM Goniometric ROM: -12 - 90 AAROM at knee   PT Diagnosis:    PT Problem List:   PT Treatment Interventions:     PT Goals  (current goals can now be found in the care plan section) Acute Rehab PT Goals Patient Stated Goal: Resume previous lifestyle with decreased pain PT Goal Formulation: With patient Time For Goal Achievement: 12/15/12 Potential to Achieve Goals: Good  Visit Information  Last PT Received On: 12/08/12 Assistance Needed: +1 History of Present Illness: Pt admitted for L TKA    Subjective Data  Patient Stated Goal: Resume previous lifestyle with decreased pain   Cognition  Cognition Arousal/Alertness: Awake/alert Behavior During Therapy: WFL for tasks assessed/performed Overall Cognitive Status: Within Functional Limits for tasks assessed    Balance     End of Session PT - End of Session Equipment Utilized During Treatment: Gait belt Activity Tolerance: Patient tolerated treatment well Patient left: in bed;with call bell/phone within reach Nurse Communication: Mobility status   GP     Keylin Ferryman 12/08/2012, 4:26 PM

## 2012-12-08 NOTE — Progress Notes (Signed)
   Subjective: 1 Day Post-Op Procedure(s) (LRB): LEFT TOTAL KNEE ARTHROPLASTY (Left)   Patient reports pain as mild, pain well controlled. No events throughout the night. He is ready to work with PT. If he does well with PT and pain stays well controlled he can be d/c'ed home today, if not he will plan on tomorrow.   Objective:   VITALS:   Filed Vitals:   12/08/12 0501  BP: 136/79  Pulse: 69  Temp: 98.1 F (36.7 C)  Resp: 16    Neurovascular intact Dorsiflexion/Plantar flexion intact Incision: dressing C/D/I No cellulitis present Compartment soft  LABS  Recent Labs  12/08/12 0445  HGB 12.8*  HCT 36.7*  WBC 16.7*  PLT 265     Recent Labs  12/08/12 0445  NA 136  K 4.7  BUN 21  CREATININE 1.12  GLUCOSE 153*     Assessment/Plan: 1 Day Post-Op Procedure(s) (LRB): LEFT TOTAL KNEE ARTHROPLASTY (Left) HV drain d/c'ed Foley cath d/c'ed Advance diet Up with therapy D/C IV fluids Discharge home with home health Follow up in 2 weeks at [facility]. Follow up with OLIN,Litisha Guagliardo D in 2 weeks.  Contact information:  [facility] [address], [address] [place] [address] [phone]    Expected ABLA  Treated with iron and will observe  Obese (BMI 30-39.9) Estimated body mass index is 30.7 kg/(m^2) as calculated from the following:   Height as of this encounter: 5\' 11"  (1.803 m).   Weight as of this encounter: 99.791 kg (220 lb). Patient also counseled that weight may inhibit the healing process Patient counseled that losing weight will help with future health issues       Anastasio Auerbach. Kuron Docken   PAC  12/08/2012, 9:13 AM

## 2012-12-08 NOTE — Op Note (Signed)
NAME:  Marc Krueger, Marc Krueger NO.:  [id number]  MEDICAL RECORD NO.:  [id number]  LOCATION:  1601                         FACILITY:  [facility]  PHYSICIAN:  Madlyn Frankel. Charlann Boxer, M.D.  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  PROCEDURE:  Left total knee replacement.  COMPONENTS USED:  DePuy a size 5 TC3 femur, a size 4 MBT revision tray, a 12.5 TC3 insert with a 41 patellar button.  SURGEON:  Madlyn Frankel. Charlann Boxer, M.D.  ASSISTANT:  Lanney Gins, PA-C.  ANESTHESIA:  Preoperative regional femoral nerve block plus general anesthetic.  DRAINS:  One medium Hemovac.  TOURNIQUET TIME:  50 minutes at 250 mmHg.  COMPLICATIONS: None.  INDICATION FOR PROCEDURE: Marc Krueger is a 57 year old who was referred for evaluation of the left knee osteoarthritis.  He had been seen, evaluated and treated conservatively with failure to respond of conservative measures.  MRI had been ordered, which revealed concerns for lateral collateral ligament issue.  I preoperatively discussed including the possibility of combined LCL ligament reconstruction in addition to the total knee replacement versus total knee replacement and observation in vice versa.  Given this lengthy discussion, the ultimate decision was to proceed with left total knee replacement and provide some enhanced stability with a bit more constraint and if he had issues with instability laterally, then lateral collateral ligament  reconstruction can be performed.  Risks of infection, DVT, component failure, need for future surgery were all discussed and reviewed.  Consent was obtained for benefit of pain relief, able to good understand from the patient's family.  PROCEDURE IN DETAIL:  The patient was brought to the operative theater. Once adequate anesthesia, preoperative antibiotics, Ancef administered, he was positioned supine and the left thigh tourniquet was placed.  The left lower extremity was then prepped and draped in sterile fashion.  A time-out was performed to identifying the patient, planned procedure, and extremity.  The leg was exsanguinated and tourniquet elevated to 250 mmHg.  The foot was placed in [facility] leg holder.  A midline incision was made and soft tissue planes created.  Medial arthrotomy was made.  Following initial debridement of synovial tissue and intraarticular fat pad and adipose tissue exposing particularly at the lateral side on the proximal lateral tibia, attention was first directed to the patella, precut measurement was noted to be about 24-25 mm, I then resected down to about 14-15 mm and used the 41 patellar button to restore patellar height.  Leg holes were drilled and a metal shin was placed into the patella to protect the patella from cut surface from retractors and saw blades.  Attention was now directed to the femur.  The patient was noted to have advanced degenerative changes to the lateral side of the knee with exposed  bone over the lateral and trochlear regions.  The femoral canal to open the drill, then irrigated to try to prevent fat emboli.  An intramedullary rod was passed and had 5-degrees of valgus.  I resected 12 mm of bone of the distal femur based off the measured of 10 mm.  I backed off 2 more mm to resect the little of bone at the lateral side distally.  Following this resection, the tibia was subluxated anteriorly.  An extramedullary guide placed based on  AP radiographs.  I initially selected 2 mm to resect up the lateral proximal tibia, but ended up resecting 2 more additional mm due to extension gap balancing. Following the proximal tibia resection, we confirmed that the cup was perpendicular in the coronal plane as well, was checked the extension gap with 10-mm extension gap block.  Following this, we sized the femur to be a size 5.  The size 5 rotation block was then pinned into position with the C-clamp.  I then placed the 4-in-1 cutting block and the anterior, posterior and chamfer cuts were made for size 5 femur.  Then the 5 TC3 cutting block was pinned into position over the lateral aspect of the distal femur.  The TC3 box cut was then made. At this point, the tibia was subluxated anteriorly following removal of an osteophyte.  I selected the size 4 tibial tray, it was pinned in position, drilled and keeled punched an MBT revision tray.  A trail size 4 MBT tray was pinned and held in place and keel punch driven into place.  Trial reduction was now carried out with the 5 TC3 femur, 4 MBT tray with initially 10-mm insert, but then a 12.5 TC3 post insert.  With this, the knee was noted to come to full extension with stable ligaments both in extension and in flexion and the patella tracked to the trochlea without application of pressure, no evidence of any lateral tilting.  Given all of these findings, we removed all the trail components, we drilled the sclerotic bone laterally with a drill, then irrigated the knee with normal saline solution and pulse lavage.  The synovial capsule junction was injected at this point with 20 mL of Exparel combined with Marcaine, saline and Toradol.  Final components were opened.  Cement was mixed.  The final components were then cemented in position.  The knee was brought into extension with a 12.5 insert and to allow the cement to cure and compression.  Excessive cement was removed.  Once the  cement had fully cured, an excessive cement was removed throughout the knee.  The final 12.5 TC3 insert was selected in place into the knee.  The knee was re-irrigated with normal saline solution.  At the end of the case, a medium Hemovac was placed deep.  The knee was re-irrigated. Tourniquet had been let down after 50 minutes.  The extensor mechanism was then reapproximated using #1 Vicryl and 0 V-Loc.  The remainder of the wound was closed with 2-0 Vicryl and running 4-0 Monocryl.  The knee was cleaned, dry and dressed sterilely using Aquacel.  Drain site dressed separately.  The knee was then wrapped in an Ace wrap.  He was then brought to the recovery room in stable condition, extubated and tolerated the procedure well.     Madlyn Frankel Charlann Boxer, M.D.     MDO/MEDQ  D:  12/07/2012  T:  12/08/2012  Job:  [id number]

## 2012-12-08 NOTE — Evaluation (Signed)
Occupational Therapy Evaluation Patient Details Name: Marc Krueger MRN: [id number] DOB: May 18, 1956 Today's Date: 12/08/2012 Time: 4098-1191 OT Time Calculation (min): 28 min  OT Assessment / Plan / Recommendation History of present illness Pt admitted for L TKA   Clinical Impression   Pt is overall at min assist for ADL. Wife present for education. Will benefit from skilled OT services while on acute to improve ADL independence.     OT Assessment  Patient needs continued OT Services    Follow Up Recommendations  No OT follow up;Supervision/Assistance - 24 hour    Barriers to Discharge      Equipment Recommendations  3 in 1 bedside comode    Recommendations for Other Services    Frequency  Min 2X/week    Precautions / Restrictions Precautions Precautions: Knee;Fall Restrictions Weight Bearing Restrictions: No   Pertinent Vitals/Pain L calf feels "tight"    ADL  Eating/Feeding: Independent Where Assessed - Eating/Feeding: Chair Grooming: Wash/dry hands;Set up Where Assessed - Grooming: Supported sitting Upper Body Bathing: Chest;Right arm;Left arm;Abdomen;Set up Where Assessed - Upper Body Bathing: Unsupported sitting Lower Body Bathing: Minimal assistance Where Assessed - Lower Body Bathing: Supported sit to stand Upper Body Dressing: Set up Where Assessed - Upper Body Dressing: Unsupported sitting Lower Body Dressing: Minimal assistance Where Assessed - Lower Body Dressing: Supported sit to stand Toilet Transfer: Minimal assistance Toilet Transfer Method: Other (comment) (with walker into bathroom) Toilet Transfer Equipment: Raised toilet seat with arms (or 3-in-1 over toilet) Toileting - Clothing Manipulation and Hygiene: Minimal assistance Where Assessed - Glass blower/designer Manipulation and Hygiene: Standing Tub/Shower Transfer: Minimal assistance Equipment Used: Rolling walker ADL Comments: Pt able to reach down at touch L foot. Discussed which leg to start  pants over first, etc. Wife present. Practiced shower transfer and discussed use of 3in1 as shower seat initially. She practiced hands on with stabilizing RW as he stepped in and out of shower.     OT Diagnosis: Generalized weakness  OT Problem List: Decreased strength;Decreased knowledge of use of DME or AE OT Treatment Interventions: Self-care/ADL training;DME and/or AE instruction;Therapeutic activities   OT Goals(Current goals can be found in the care plan section) Acute Rehab OT Goals Patient Stated Goal: Resume previous lifestyle with decreased pain OT Goal Formulation: With patient Time For Goal Achievement: 12/15/12 Potential to Achieve Goals: Good ADL Goals Pt Will Perform Grooming: with supervision;standing Pt Will Perform Lower Body Dressing: with supervision;sit to/from stand Pt Will Transfer to Toilet: with supervision;ambulating;bedside commode Pt Will Perform Toileting - Clothing Manipulation and hygiene: with supervision;sit to/from stand Pt Will Perform Tub/Shower Transfer: with min guard assist;Shower transfer;3 in 1  Visit Information  Last OT Received On: 12/08/12 Assistance Needed: +1 History of Present Illness: Pt admitted for L TKA       Prior Functioning     Home Living Family/patient expects to be discharged to:: Private residence Living Arrangements: Spouse/significant other;Children Available Help at Discharge: Family Type of Home: House Home Access: Stairs to enter Secretary/administrator of Steps: 6 Entrance Stairs-Rails: Right;Left Home Layout: One level Home Equipment: Toilet riser Prior Function Level of Independence: Independent Communication Communication: No difficulties         Vision/Perception     Cognition  Cognition Arousal/Alertness: Awake/alert Behavior During Therapy: WFL for tasks assessed/performed Overall Cognitive Status: Within Functional Limits for tasks assessed    Extremity/Trunk Assessment Upper Extremity  Assessment Upper Extremity Assessment: Overall WFL for tasks assessed Lower Extremity Assessment Lower Extremity Assessment: LLE deficits/detail LLE  Deficits / Details: quads 3-/5 with AAROM at knee -10 - 80     Mobility  Transfers Transfers: Sit to Stand;Stand to Sit Sit to Stand: 4: Min assist;With upper extremity assist;From chair/3-in-1 Stand to Sit: 4: Min assist;With upper extremity assist;To chair/3-in-1 Details for Transfer Assistance: cues for transition position, use of UEs and LE managment        Balance     End of Session OT - End of Session Equipment Utilized During Treatment: Gait belt Activity Tolerance: Patient tolerated treatment well Patient left: in chair;with call bell/phone within reach;with family/visitor present  GO     Lennox Laity [phone] 12/08/2012, 1:01 PM

## 2012-12-08 NOTE — Care Management Note (Signed)
Cm spoke with patient at the bedside concerning discharge planning. Per pt choice [facility] to provide [facility] services upon discharge. [facility] rep Venia Minks notified. MD ordered DME present in room with patient during CM consultation. Pt's spouse to assist in home care. No other needs or barriers identified.    Roxy Manns Cerys Winget,RN,BSN [phone]

## 2012-12-08 NOTE — Progress Notes (Signed)
Discharge summary sent to payer through [facility]  

## 2012-12-09 LAB — BASIC METABOLIC PANEL
Calcium: 8.7 mg/dL (ref 8.4–10.5)
GFR calc non Af Amer: 74 mL/min — ABNORMAL LOW (ref >90)
Glucose, Bld: 145 mg/dL — ABNORMAL HIGH (ref 70–99)
Sodium: 136 mEq/L (ref 135–145)

## 2012-12-09 LAB — CBC
MCH: 31.6 pg (ref 26.0–34.0)
MCHC: 34.5 g/dL (ref 30.0–36.0)
Platelets: 266 10*3/uL (ref 150–400)
RDW: 13.1 % (ref 11.5–15.5)

## 2012-12-09 NOTE — Progress Notes (Signed)
Physical Therapy Treatment Patient Details Name: Marc Krueger MRN: [id number] DOB: 03/18/1956 Today's Date: 12/09/2012 Time: 4403-4742 PT Time Calculation (min): 60 min  PT Assessment / Plan / Recommendation  PT Comments   Reviewed stairs and home exercise program with pt and spouse.    Follow Up Recommendations  Home health PT     Does the patient have the potential to tolerate intense rehabilitation     Barriers to Discharge        Equipment Recommendations  Rolling walker with 5" wheels    Recommendations for Other Services OT consult  Frequency 7X/week   Progress towards PT Goals Progress towards PT goals: Progressing toward goals  Plan Current plan remains appropriate    Precautions / Restrictions Precautions Precautions: Knee;Fall Restrictions Weight Bearing Restrictions: No Other Position/Activity Restrictions: WBAT   Pertinent Vitals/Pain 4/10 with activity    Mobility  Bed Mobility Bed Mobility: Not assessed Supine to Sit: 5: Supervision Sit to Supine: 5: Supervision Details for Bed Mobility Assistance: pt self assisting L LE with R LE Transfers Transfers: Sit to Stand;Stand to Sit Sit to Stand: 5: Supervision Stand to Sit: 5: Supervision Details for Transfer Assistance: min cues for use of UEs to self assist Ambulation/Gait Ambulation/Gait Assistance: 4: Min guard;5: Supervision Ambulation Distance (Feet): 150 Feet (and 25) Assistive device: Rolling walker Ambulation/Gait Assistance Details: cues for posture and position from RW Gait Pattern: Step-to pattern;Step-through pattern Stairs: Yes Stairs Assistance: 4: Min assist Stairs Assistance Details (indicate cue type and reason): cues for foot/crutch placement and sequence Stair Management Technique: One rail Right;Step to pattern;Forwards;With crutches Number of Stairs: 4    Exercises Total Joint Exercises Ankle Circles/Pumps: AROM;Supine;Both;20 reps Quad Sets: AROM;Supine;Both;20 reps Heel  Slides: AAROM;Supine;Left;20 reps (educated on self assist with sheet wrapped around ankle) Straight Leg Raises: Left;Supine;AROM;20 reps Long Arc Quad: AROM;AAROM;15 reps;Seated;Both Knee Flexion: Left;10 reps;Seated;AAROM (over pressure with R LE) Goniometric ROM: AAROM at L knee -10- 95   PT Diagnosis:    PT Problem List:   PT Treatment Interventions:     PT Goals (current goals can now be found in the care plan section) Acute Rehab PT Goals Patient Stated Goal: Resume previous lifestyle with decreased pain PT Goal Formulation: With patient Time For Goal Achievement: 12/15/12 Potential to Achieve Goals: Good  Visit Information  Last PT Received On: 12/09/12 Assistance Needed: +1 History of Present Illness: Pt admitted for L TKA    Subjective Data  Subjective: [facility] called and they can't come out until Saturday. Patient Stated Goal: Resume previous lifestyle with decreased pain   Cognition  Cognition Arousal/Alertness: Awake/alert Behavior During Therapy: WFL for tasks assessed/performed Overall Cognitive Status: Within Functional Limits for tasks assessed    Balance     End of Session PT - End of Session Equipment Utilized During Treatment: Gait belt Activity Tolerance: Patient tolerated treatment well Patient left: in chair;with call bell/phone within reach Nurse Communication: Mobility status   GP     Dalyah Pla 12/09/2012, 5:44 PM

## 2012-12-09 NOTE — Progress Notes (Signed)
   Subjective: 2 Days Post-Op Procedure(s) (LRB): LEFT TOTAL KNEE ARTHROPLASTY (Left)   Patient reports pain as mild, pain controlled. No events throughout the night. Ready to be discharged home.  Objective:   VITALS:   Filed Vitals:   12/09/12 0537  BP: 120/70  Pulse: 63  Temp: 97.7 F (36.5 C)  Resp: 16    Neurovascular intact Dorsiflexion/Plantar flexion intact Incision: dressing C/D/I No cellulitis present Compartment soft  LABS  Recent Labs  12/08/12 0445 12/09/12 0420  HGB 12.8* 11.1*  HCT 36.7* 32.2*  WBC 16.7* 16.0*  PLT 265 266     Recent Labs  12/08/12 0445 12/09/12 0420  NA 136 136  K 4.7 4.3  BUN 21 22  CREATININE 1.12 1.10  GLUCOSE 153* 145*     Assessment/Plan: 2 Days Post-Op Procedure(s) (LRB): LEFT TOTAL KNEE ARTHROPLASTY (Left) Up with therapy Discharge home with home health Follow up in 2 weeks at [facility]. Follow up with OLIN,Analissa Bayless D in 2 weeks.  Contact information:  [facility] [address] [place] [address] [phone]    Expected ABLA  Treated with iron and will observe   Obese (BMI 30-39.9)  Estimated body mass index is 30.7 kg/(m^2) as calculated from the following:      Height as of this encounter: 5\' 11"  (1.803 m).      Weight as of this encounter: 99.791 kg (220 lb).  Patient also counseled that weight may inhibit the healing process  Patient counseled that losing weight will help with future health issues       Marc Krueger   PAC  12/09/2012, 2:04 PM

## 2012-12-09 NOTE — Progress Notes (Signed)
Occupational Therapy Treatment Patient Details Name: Marc Krueger MRN: [id number] DOB: [birth year] Today's Date: 12/09/2012 Time: 7253-6644 OT Time Calculation (min): 16 min  OT Assessment / Plan / Recommendation  OT comments  Pt progressing nicely.  Able to return demonstration of safe techniques for shower transfer  Follow Up Recommendations  No OT follow up;Supervision/Assistance - 24 hour    Barriers to Discharge       Equipment Recommendations  3 in 1 bedside comode    Recommendations for Other Services    Frequency Min 2X/week   Progress towards OT Goals Progress towards OT goals: Progressing toward goals  Plan Discharge plan remains appropriate    Precautions / Restrictions Precautions Precautions: Knee;Fall Restrictions Weight Bearing Restrictions: No Other Position/Activity Restrictions: WBAT   Pertinent Vitals/Pain     ADL  Tub/Shower Transfer: Insurance risk surveyor Method: Science writer: Walk in shower Equipment Used: Rolling walker Transfers/Ambulation Related to ADLs: min guard assist ADL Comments: Pt able to return demonstration of safe technique for shower transfer  onto 3in1    OT Diagnosis:    OT Problem List:   OT Treatment Interventions:     OT Goals(current goals can now be found in the care plan section) ADL Goals Pt Will Perform Grooming: with supervision;standing Pt Will Perform Lower Body Dressing: with supervision;sit to/from stand Pt Will Transfer to Toilet: with supervision;ambulating;bedside commode Pt Will Perform Toileting - Clothing Manipulation and hygiene: with supervision;sit to/from stand Pt Will Perform Tub/Shower Transfer: with min guard assist;Shower transfer;3 in 1  Visit Information  Last OT Received On: 12/09/12 Assistance Needed: +1 History of Present Illness: Pt admitted for L TKA    Subjective Data      Prior Functioning       Cognition  Cognition Arousal/Alertness:  Awake/alert Behavior During Therapy: WFL for tasks assessed/performed Overall Cognitive Status: Within Functional Limits for tasks assessed    Mobility  Bed Mobility Bed Mobility: Not assessed Transfers Transfers: Sit to Stand;Stand to Sit Sit to Stand: 5: Supervision Stand to Sit: 5: Supervision    Exercises      Balance     End of Session OT - End of Session Equipment Utilized During Treatment: Rolling walker Activity Tolerance: Patient tolerated treatment well Patient left: in chair;with call bell/phone within reach;with family/visitor present  GO     Billijo Dilling M 12/09/2012, 5:41 PM

## 2012-12-09 NOTE — Care Management Note (Signed)
    Page 1 of 2   12/09/2012     11:37:06 AM   CARE MANAGEMENT NOTE 12/09/2012  Patient:  Marc Krueger,Marc Krueger   Account Number:  [id number]  Date Initiated:  12/09/2012  Documentation initiated by:    Subjective/Objective Assessment:   57 yo male admitted s/p LEFT TOTAL KNEE ARTHROPLASTY (Left)     Action/Plan:   Home when stable   Anticipated DC Date:  12/09/2012   Anticipated DC Plan:  HOME W HOME HEALTH SERVICES      DC Planning Services  CM consult      Choice offered to / List presented to:  C-1 Patient   DME arranged  3-N-1  Levan Hurst      DME agency  [facility]     HH arranged  HH-2 PT      [facility] agency  [facility]   Status of service:  Completed, signed off Medicare Important Message given?   (If response is "NO", the following Medicare IM given date fields will be blank) Date Medicare IM given:   Date Additional Medicare IM given:    Discharge Disposition:  HOME W HOME HEALTH SERVICES  Per UR Regulation:  Reviewed for med. necessity/level of care/duration of stay  If discussed at Long Length of Stay Meetings, dates discussed:    Comments:  12/09/12 Lavel Rieman RN,BSN NCM [phone] D/C HOME W/HHPT,& RW,3N1-BROUGHT TO RM BY [facility] DME REP.[facility] REP AWARE OF D/C,& HH ORDERS.  Sharmon Leyden, RN Registered Nurse Signed CASE MANAGEMENT Care Management Note Service date: 12/08/2012 10:32 AM Cm spoke with patient at the bedside concerning discharge planning. Per pt choice [facility] to provide [facility] services upon discharge. Genevieve Norlander rep Venia Minks notified. MD ordered DME present in room with patient during CM consultation. Pt's spouse to assist in home care. No other needs or barriers identified.

## 2012-12-09 NOTE — Progress Notes (Signed)
[facility]   [facility] is providing the following services: Per CM notes, patient has DME equipment in the room.   If patient discharges after hours, please call [phone].   Marc Krueger 12/09/2012, 7:30 AM

## 2012-12-09 NOTE — Progress Notes (Signed)
Physical Therapy Treatment Patient Details Name: Marc Krueger MRN: [id number] DOB: 1956-05-22 Today's Date: 12/09/2012 Time: 2595-6387 PT Time Calculation (min): 61 min  PT Assessment / Plan / Recommendation  PT Comments     Follow Up Recommendations  Home health PT     Does the patient have the potential to tolerate intense rehabilitation     Barriers to Discharge        Equipment Recommendations  Rolling walker with 5" wheels    Recommendations for Other Services OT consult  Frequency 7X/week   Progress towards PT Goals Progress towards PT goals: Progressing toward goals  Plan Current plan remains appropriate    Precautions / Restrictions Precautions Precautions: Knee;Fall Restrictions Weight Bearing Restrictions: No Other Position/Activity Restrictions: WBAT   Pertinent Vitals/Pain 4/10; premed, cold packs provided    Mobility  Bed Mobility Bed Mobility: Supine to Sit Supine to Sit: 5: Supervision Transfers Transfers: Sit to Stand;Stand to Sit Sit to Stand: 5: Supervision Stand to Sit: 5: Supervision Details for Transfer Assistance: min cues for use of UEs to self assist Ambulation/Gait Ambulation/Gait Assistance: 4: Min guard;5: Supervision Ambulation Distance (Feet): 280 Feet Assistive device: Rolling walker Ambulation/Gait Assistance Details: cues for posture, stride length and position from RW Gait Pattern: Step-to pattern;Antalgic;Trunk flexed General Gait Details: [name] Stairs: Yes Stairs Assistance: 4: Min assist Stairs Assistance Details (indicate cue type and reason): cues for sequence and foot/crutch placement Stair Management Technique: One rail Right;Step to pattern;Forwards;With crutches Number of Stairs: 4    Exercises Total Joint Exercises Ankle Circles/Pumps: AROM;Supine;Both;20 reps Quad Sets: AROM;Supine;Both;20 reps Heel Slides: AAROM;Supine;Left;20 reps Straight Leg  Raises: Left;Supine;AROM;20 reps Long Arc Quad: AROM;AAROM;15 reps;Seated;Both   PT Diagnosis:    PT Problem List:   PT Treatment Interventions:     PT Goals (current goals can now be found in the care plan section) Acute Rehab PT Goals Patient Stated Goal: Resume previous lifestyle with decreased pain PT Goal Formulation: With patient Time For Goal Achievement: 12/15/12 Potential to Achieve Goals: Good  Visit Information  Last PT Received On: 12/09/12 Assistance Needed: +1    Subjective Data  Patient Stated Goal: Resume previous lifestyle with decreased pain   Cognition  Cognition Arousal/Alertness: Awake/alert Behavior During Therapy: WFL for tasks assessed/performed Overall Cognitive Status: Within Functional Limits for tasks assessed    Balance     End of Session PT - End of Session Equipment Utilized During Treatment: Gait belt Activity Tolerance: Patient tolerated treatment well Patient left: in chair;with call bell/phone within reach Nurse Communication: Mobility status   GP     Marc Krueger 12/09/2012, 12:33 PM

## 2012-12-09 NOTE — Progress Notes (Signed)
Discharge summary sent to payer through [facility]  

## 2012-12-13 NOTE — Discharge Summary (Signed)
Physician Discharge Summary  Patient ID: Marc Krueger MRN: [id number] DOB/AGE: April 10, 1956 57 y.o.  Admit date: 12/07/2012 Discharge date: 12/09/2012   Procedures:  Procedure(s) (LRB): LEFT TOTAL KNEE ARTHROPLASTY (Left)  Attending Physician:  Dr. Durene Romans   Admission Diagnoses:   Left knee OA / pain, instability of the LCL  Discharge Diagnoses:  Principal Problem:   S/P left TKA Active Problems:   Expected blood loss anemia   Obese  Past Medical History  Diagnosis Date  . Allergy 11-23-1012    seasonal alllergy  . Arthritis     osteoarthritis-knees  . Sinusitis     occ./occ. ringing in ears    HPI: Marc Krueger, 57 y.o. male, has a history of pain and functional disability in the left knee due to arthritis and has failed non-surgical conservative treatments for greater than 12 weeks to include NSAID's and/or analgesics and activity modification. He was originally a Dr. Shelle Iron patient, but because of instability of the LCL Dr. Charlann Boxer saw the patient. A long discussion was had regarding the procedure and doing a more constrained knee to compensate for the instability of the LCL. A discussion was also had that he potentially may need another surgery to repair the LCL. Onset of symptoms was gradual, starting years ago with rapidlly worsening course since that time. The patient noted no past surgery on the left knee(s). Patient currently rates pain in the left knee(s) at 8 out of 10 with activity. Patient has worsening of pain with activity and weight bearing, pain that interferes with activities of daily living, pain with passive range of motion, crepitus and joint swelling. Patient has evidence of periarticular osteophytes and joint space narrowing by imaging studies. There is no active signs of infection. Risks, benefits and expectations were discussed with the patient. Patient understand the risks, benefits and expectations and wishes to proceed with surgery.   PCP: Terressa Koyanagi., DO   Discharged Condition: good  Hospital Course:  Patient underwent the above stated procedure on 12/07/2012. Patient tolerated the procedure well and brought to the recovery room in good condition and subsequently to the floor.  POD #1 BP: 136/79 ; Pulse: 69 ; Temp: 98.1 F (36.7 C) ; Resp: 16 Pt's foley was removed, as well as the hemovac drain removed. IV was changed to a saline lock. Patient reports pain as mild, pain well controlled. No events throughout the night. He is ready to work with PT. Neurovascular intact, dorsiflexion/plantar flexion intact, incision: dressing C/D/I, no cellulitis present and compartment soft.   LABS  Basename    HGB  12.8  HCT  36.7   POD #2  BP: 120/70 ; Pulse: 63 ; Temp: 97.7 F (36.5 C) ; Resp: 16  Patient reports pain as mild, pain controlled. No events throughout the night. Ready to be discharged home. Neurovascular intact, dorsiflexion/plantar flexion intact, incision: dressing C/D/I, no cellulitis present and compartment soft.   LABS  Basename    HGB  11.1  HCT  32.2    Discharge Exam: General appearance: alert, cooperative and no distress Extremities: Homans sign is negative, no sign of DVT, no edema, redness or tenderness in the calves or thighs and no ulcers, gangrene or trophic changes  Disposition:    Home-Health Care Svc with follow up in 2 weeks   Follow-up Information   Follow up with Shelda Pal, MD. Schedule an appointment as soon as possible for a visit in 2 weeks.   Contact information:   [address] [place] [address] [phone]       Discharge Orders   Future Orders Complete By Expires     Call MD / Call 911  As directed     Comments:      If you experience chest pain or shortness of breath, CALL 911 and be transported to the hospital emergency room.  If you develope a fever above 101 F, pus (white drainage) or increased drainage or redness at the wound, or calf pain, call your  surgeon's office.    Change dressing  As directed     Comments:      Maintain surgical dressing for 10-14 days, then change the dressing daily with sterile 4 x 4 inch gauze dressing and tape. Keep the area dry and clean.    Constipation Prevention  As directed     Comments:      Drink plenty of fluids.  Prune juice may be helpful.  You may use a stool softener, such as Colace (over the counter) 100 mg twice a day.  Use MiraLax (over the counter) for constipation as needed.    Diet - low sodium heart healthy  As directed     Discharge instructions  As directed     Comments:      Maintain surgical dressing for 10-14 days, then replace with gauze and tape. Keep the area dry and clean until follow up. Follow up in 2 weeks at [facility]. Call with any questions or concerns.    Increase activity slowly as tolerated  As directed     TED hose  As directed     Comments:      Use stockings (TED hose) for 2 weeks on both leg(s).  You may remove them at night for sleeping.    Weight bearing as tolerated  As directed          Medication List    STOP taking these medications       diclofenac 50 MG EC tablet  Commonly known as:  VOLTAREN      TAKE these medications       aspirin 325 MG EC tablet  Take 1 tablet (325 mg total) by mouth 2 (two) times daily.     DSS 100 MG Caps  Take 100 mg by mouth 2 (two) times daily.     ferrous sulfate 325 (65 FE) MG tablet  Take 1 tablet (325 mg total) by mouth 3 (three) times daily after meals.     fluticasone 50 MCG/ACT nasal spray  Commonly known as:  FLONASE  Place 2 sprays into the nose daily.     HYDROcodone-acetaminophen 7.5-325 MG per tablet  Commonly known as:  NORCO  Take 1-2 tablets by mouth every 4 (four) hours.     loratadine 10 MG tablet  Commonly known as:  CLARITIN  Take 10 mg by mouth daily.     methocarbamol 500 MG tablet  Commonly known as:  ROBAXIN  Take 1 tablet (500 mg total) by mouth every 6 (six) hours as  needed (muscle spasms).     multivitamin tablet  Take 1 tablet by mouth daily.     polyethylene glycol packet  Commonly known as:  MIRALAX / GLYCOLAX  Take 17 g by mouth 2 (two) times daily.         Signed: Anastasio Auerbach. Deano Tomaszewski   PAC  12/13/2012, 9:24 AM

## 2013-02-16 ENCOUNTER — Encounter: Payer: Self-pay | Admitting: Family Medicine

## 2013-02-16 DIAGNOSIS — J309 Allergic rhinitis, unspecified: Secondary | ICD-10-CM

## 2013-02-16 DIAGNOSIS — R0982 Postnasal drip: Secondary | ICD-10-CM

## 2013-02-17 MED ORDER — FLUTICASONE PROPIONATE 50 MCG/ACT NA SUSP: 2.0000 | Freq: Every day | NASAL | Status: DC

## 2013-02-17 NOTE — Telephone Encounter (Signed)
Rx for flonase sent to pharmacy

## 2013-02-28 ENCOUNTER — Ambulatory Visit (INDEPENDENT_AMBULATORY_CARE_PROVIDER_SITE_OTHER): Payer: BC Managed Care – PPO | Admitting: Family

## 2013-02-28 ENCOUNTER — Encounter: Payer: Self-pay | Admitting: Family

## 2013-02-28 VITALS — BP 144/100 | HR 98 | Temp 98.5°F | Wt 237.0 lb

## 2013-02-28 DIAGNOSIS — R05 Cough: Secondary | ICD-10-CM

## 2013-02-28 DIAGNOSIS — K219 Gastro-esophageal reflux disease without esophagitis: Secondary | ICD-10-CM

## 2013-02-28 MED ORDER — HYDROCOD POLST-CHLORPHEN POLST 10-8 MG/5ML PO LQCR: 5.0000 mL | Freq: Two times a day (BID) | ORAL | Status: DC | PRN

## 2013-02-28 MED ORDER — ESOMEPRAZOLE MAGNESIUM 40 MG PO CPDR: 40.0000 mg | DELAYED_RELEASE_CAPSULE | Freq: Every day | ORAL | Status: DC

## 2013-02-28 MED ORDER — METHYLPREDNISOLONE 4 MG PO KIT: PACK | ORAL | Status: AC

## 2013-02-28 NOTE — Patient Instructions (Addendum)

## 2013-02-28 NOTE — Progress Notes (Signed)
Subjective:    Patient ID: Marc Krueger, male    DOB: 05-May-1956, 57 y.o.   MRN: [id number]  HPI 57 year old white male, nonsmoker, patient of Dr. Selena Batten presents today with complaints of a cough x1 month. Reports a tickle sensation in his throat. Has noticed these began to have wheezing at night. Reports an increase in heartburn and indigestion recently. In the past, he taken Nexium that has helped. He has been taking NyQuil for relief.  Review of Systems  Constitutional: Negative.   HENT: Positive for congestion, rhinorrhea and postnasal drip.   Respiratory: Positive for cough.   Cardiovascular: Negative.   Endocrine: Negative.   Musculoskeletal: Negative.   Skin: Negative.   Allergic/Immunologic: Negative.   Neurological: Negative.   Hematological: Negative.   Psychiatric/Behavioral: Negative.    Past Medical History  Diagnosis Date  . Allergy 11-23-1012    seasonal alllergy  . Arthritis     osteoarthritis-knees  . Sinusitis     occ./occ. ringing in ears    History   Social History  . Marital Status: Married    Spouse Name: N/A    Number of Children: N/A  . Years of Education: N/A   Occupational History  . Not on file.   Social History Main Topics  . Smoking status: Never Smoker   . Smokeless tobacco: Not on file  . Alcohol Use: Yes     Comment: 1 per week  . Drug Use: No  . Sexual Activity: Yes   Other Topics Concern  . Not on file   Social History Narrative  . No narrative on file    Past Surgical History  Procedure Laterality Date  . Foot sugery Bilateral 1980  . Lasik    . 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 Arthritis Mother   . Heart disease Mother   . Lung cancer Sister   . Arthritis Brother   . Diabetes Brother   . Arthritis Brother     No Known  Allergies  Current Outpatient Prescriptions on File Prior to Visit  Medication Sig Dispense Refill  . docusate sodium 100 MG CAPS Take 100 mg by mouth 2 (two) times daily.  10 capsule  0  . ferrous sulfate 325 (65 FE) MG tablet Take 1 tablet (325 mg total) by mouth 3 (three) times daily after meals.    3  . fluticasone (FLONASE) 50 MCG/ACT nasal spray Place 2 sprays into the nose daily.  16 g  1  . fluticasone (FLONASE) 50 MCG/ACT nasal spray Place 2 sprays into the nose daily.  16 g  1  . HYDROcodone-acetaminophen (NORCO) 7.5-325 MG per tablet Take 1-2 tablets by mouth every 4 (four) hours.  100 tablet  0  . loratadine (CLARITIN) 10 MG tablet Take 10 mg by mouth daily.      . methocarbamol (ROBAXIN) 500 MG tablet Take 1 tablet (500 mg total) by mouth every 6 (six) hours as needed (muscle spasms).  50 tablet  0  . Multiple Vitamin (MULTIVITAMIN) tablet Take 1 tablet by mouth daily.      . polyethylene glycol (MIRALAX / GLYCOLAX) packet Take 17 g by mouth 2 (two) times daily.  14 each  0   No current facility-administered medications on file prior to visit.    BP  144/100  Pulse 98  Temp(Src) 98.5 F (36.9 C) (Oral)  Wt 237 lb (107.502 kg)  BMI 33.07 kg/m2chart     Objective:   Physical Exam  Constitutional: He is oriented to person, place, and time. He appears well-developed and well-nourished.  HENT:  Right Ear: External ear normal.  Left Ear: External ear normal.  Nose: Nose normal.  Mouth/Throat: Oropharynx is clear and moist.  Neck: Normal range of motion. Neck supple.  Cardiovascular: Normal rate, regular rhythm and normal heart sounds.   Pulmonary/Chest: Effort normal and breath sounds normal. He has no wheezes.  Abdominal: Soft. Bowel sounds are normal.  Musculoskeletal: Normal range of motion.  Neurological: He is alert and oriented to person, place, and time.  Skin: Skin is warm and dry.  Psychiatric: He has a normal mood and affect.          Assessment & Plan:   Assessment: 1. Cough related to postnasal drip 2. GERD  Plan: I believe his cough is postnasal drip but there is probably not helping. Start Nexium 40 mg once daily. Medrol Dosepak as directed. Tussionex as needed for cough. Patient on the opposite symptoms worsen or persist. Recheck as scheduled, and as needed.

## 2013-03-08 ENCOUNTER — Other Ambulatory Visit: Payer: Self-pay | Admitting: Family

## 2013-03-08 MED ORDER — HYDROCOD POLST-CHLORPHEN POLST 10-8 MG/5ML PO LQCR: 5.0000 mL | Freq: Two times a day (BID) | ORAL | Status: DC | PRN

## 2013-03-11 ENCOUNTER — Encounter: Payer: Self-pay | Admitting: Family Medicine

## 2013-03-11 ENCOUNTER — Telehealth: Payer: Self-pay

## 2013-03-11 ENCOUNTER — Ambulatory Visit (INDEPENDENT_AMBULATORY_CARE_PROVIDER_SITE_OTHER): Payer: BC Managed Care – PPO | Admitting: Family Medicine

## 2013-03-11 ENCOUNTER — Ambulatory Visit (INDEPENDENT_AMBULATORY_CARE_PROVIDER_SITE_OTHER)
Admission: RE | Admit: 2013-03-11 | Discharge: 2013-03-11 | Disposition: A | Discharge status: Home / Self Care | Payer: BC Managed Care – PPO | Source: Ambulatory Visit | Attending: Family Medicine | Admitting: Family Medicine

## 2013-03-11 VITALS — BP 140/82 | HR 92 | Temp 98.0°F | Wt 233.0 lb

## 2013-03-11 DIAGNOSIS — R918 Other nonspecific abnormal finding of lung field: Secondary | ICD-10-CM

## 2013-03-11 DIAGNOSIS — Z23 Encounter for immunization: Secondary | ICD-10-CM

## 2013-03-11 DIAGNOSIS — B370 Candidal stomatitis: Secondary | ICD-10-CM

## 2013-03-11 DIAGNOSIS — R05 Cough: Secondary | ICD-10-CM

## 2013-03-11 IMAGING — CR DG CHEST 2V
2 series · 2 of 2 positions shown · non-contrast
Comparison: None.

CLINICAL DATA: Cough, wheezing.

EXAM:
CHEST  2 VIEW

[view not recorded (1 of 2)]
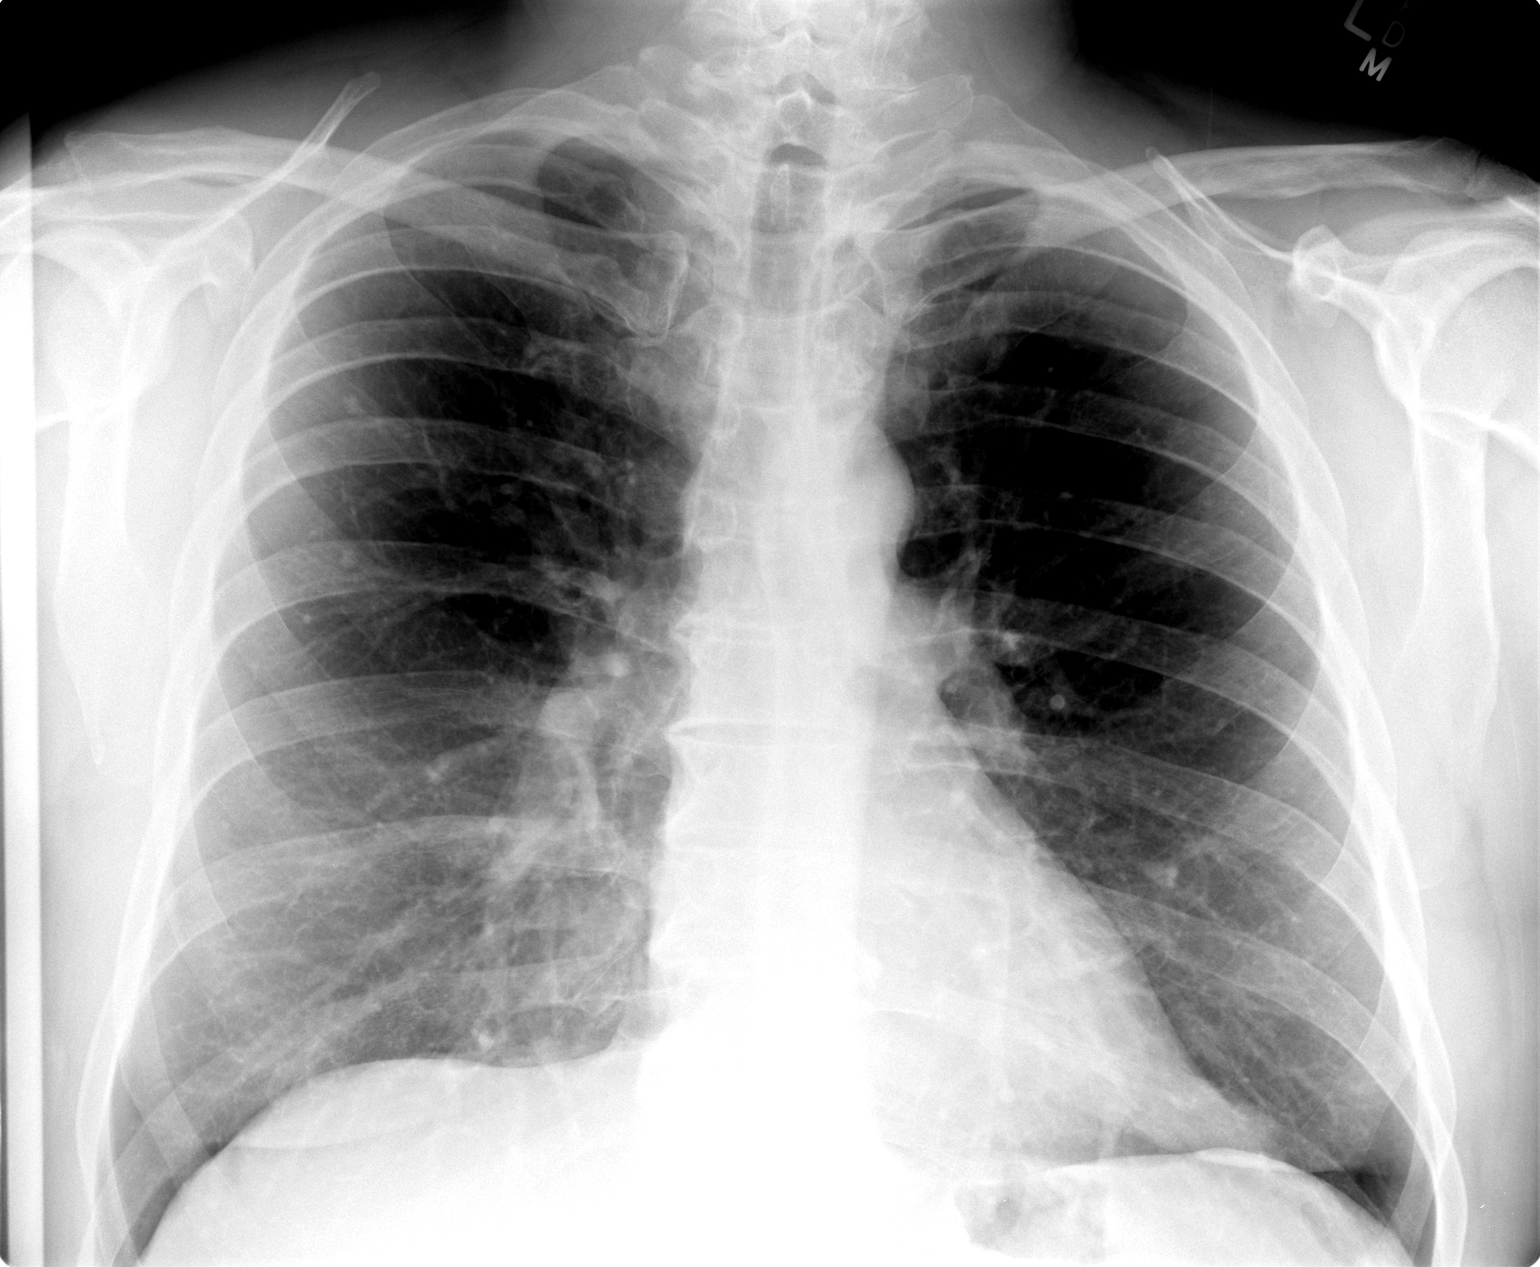

[view not recorded (2 of 2)]
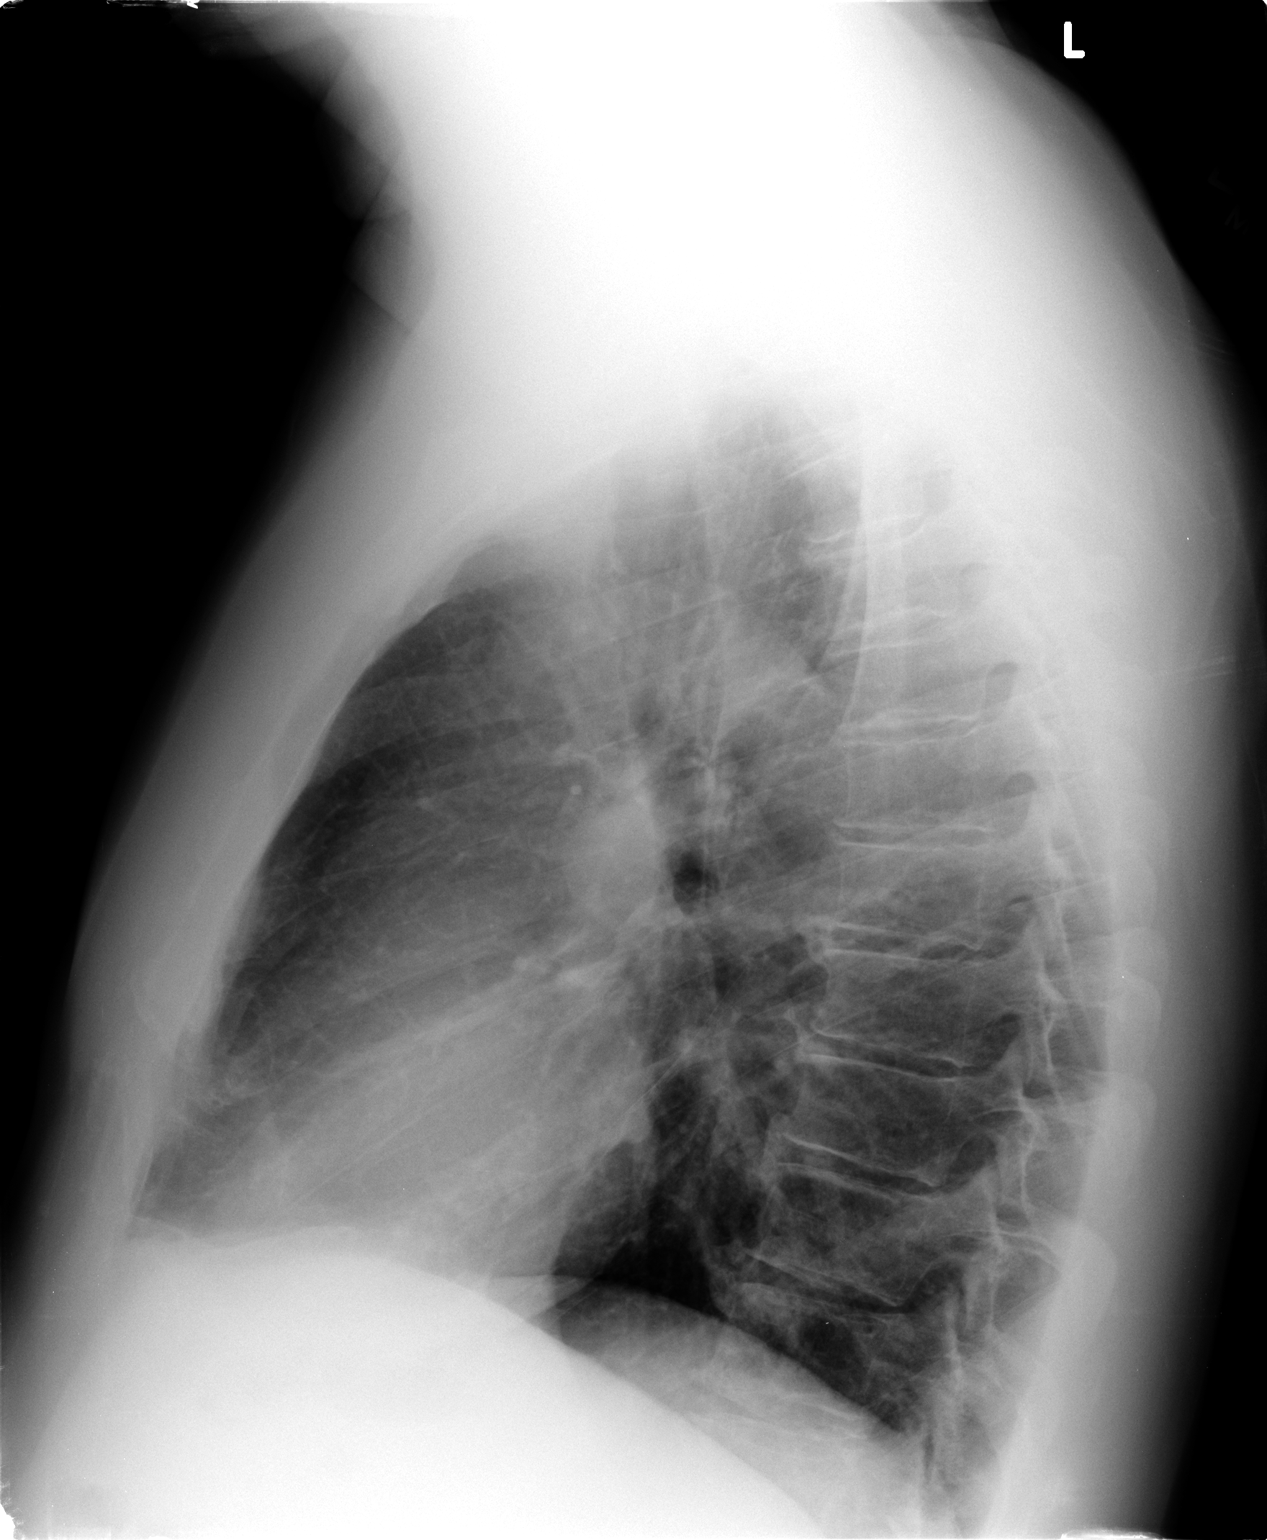

[2 of 2 positions shown; findings below may reference images not displayed]

FINDINGS: Normal cardiac and mediastinal contours. No consolidative pulmonary
opacities. There are multiple bilateral nodular densities with the
largest in the left mid to lower lung measuring 9 mm and the right
upper lung measuring 7 mm. No pleural effusion or pneumothorax. No
aggressive appearing osseous lesions.
IMPRESSION: 1. No acute cardiopulmonary process.
2. Bilateral pulmonary nodular densities. Further evaluation with
chest CT is recommended.
These results will be called to the ordering clinician or
representative by the Radiologist Assistant, and communication
documented in the PACS Dashboard.

## 2013-03-11 MED ORDER — PANTOPRAZOLE SODIUM 40 MG PO TBEC: 40.0000 mg | DELAYED_RELEASE_TABLET | Freq: Every day | ORAL | Status: DC

## 2013-03-11 MED ORDER — NYSTATIN 100000 UNIT/ML MT SUSP: 500000.0000 [IU] | Freq: Four times a day (QID) | OROMUCOSAL | Status: DC

## 2013-03-11 MED ORDER — AZELASTINE HCL 0.1 % NA SOLN: 2.0000 | Freq: Two times a day (BID) | NASAL | Status: DC

## 2013-03-11 NOTE — Patient Instructions (Signed)
Thrush, Adult   Thrush is a yeast infection that develops in the mouth and throat and on the tongue. The medical term for this is oropharyngeal candidiasis, or OPC. Thrush is most common in older adults, but it can occur at any age. Thrush occurs when a yeast called candida grows out of control. Candida normally is present in small amounts in the mouth and on other mucous membranes. However, under certain circumstances, candida can grow rapidly, causing thrush. Thrush can be a recurring problem for people who have chronic illnesses or who take medications that limit the body's ability to fight infection (weakened immune system). Since these people have difficulty fighting infections, the fungus that causes thrush can spread throughout the body. This can cause life-threatening blood or organ infections.  CAUSES   Candida, the yeast that causes thrush, is normally present in small amounts in the mouth and on other mucous membranes. It usually causes no harm. However, when conditions are present that allow the yeast to grow uncontrolled, it invades surrounding tissues and becomes an infection. Thrush is most commonly caused by the yeast Candida albicans. Less often, other forms of candida can lead to thrush.  There are many types of bacteria in your mouth that normally control the growth of candida. Sometimes a new type of bacteria gets into your mouth and disrupts the balance of the germs already there. This can allow candida to overgrow. Other factors that increase your risk of developing thrush include:   An impaired ability to fight infection (weakened immune system). A normal immune system is usually strong enough to prevent candida from overgrowing.   Older adults are more likely to develop thrush because they may have weaker immune systems.   People with human immunodeficiency virus (HIV) infection have a high likelihood of developing thrush. About 90% of people with HIV develop thrush at some point during  the course of their disease.   People with diabetes are more likely to get thrush because high blood sugar levels promote overgrowth of the candida fungus.   A dry mouth (xerostomia). Dry mouth can result from overuse of mouthwashes or from certain conditions such as Sjgren's syndrome.   Pregnancy. Hormone changes during pregnancy can lead to thrush by altering the balance of bacteria in the mouth.   Poor dental care, especially in people who have false teeth.   The use of antibiotic medications. This may lead to thrush by changing the balance of bacteria in the mouth.  SYMPTOMS   Thrush can be a mild infection that causes no symptoms. If symptoms develop, they may include the following:   A burning feeling in the mouth and throat. This can occur at the start of a thrush infection.   White patches that adhere to the mouth and tongue. The tissue around the patches may be red, raw, and painful. If rubbed (during tooth brushing, for example), the patches and the tissue of the mouth may bleed easily.   A bad taste in the mouth or difficulty tasting foods.   Cottony feeling in the mouth.   Sometimes pain during eating and swallowing.  DIAGNOSIS   Your caregiver can usually diagnose thrush by exam. In addition to looking in your mouth, your caregiver will ask you questions about your health.  TREATMENT   Medications that help prevent the growth of fungi (antifungals) are the standard treatment for thrush. These medications are either applied directly to the affected area (topical) or swallowed (oral).  Mild thrush  In   adults, mild cases of thrush may clear up with simple treatment that can be done at home. This treatment usually involves using an antifungal mouth rinse or lozenges. Treatment usually lasts about 14 days.  Moderate to severe thrush   More severe thrush infections that have spread to the esophagus are treated with an oral antifungal medication. A topical antifungal medication may also be  used.   For some severe infections, a treatment period longer than 14 days may be needed.   Oral antifungal medications are almost never used during pregnancy because the fetus may be harmed. However, if a pregnant woman has a rare, severe thrush infection that has spread to her blood, oral antifungal medications may be used. In this case, the risk of harm to the mother and fetus from the severe thrush infection may be greater than the risk posed by the use of antifungal medications.  Persistent or recurrent thrush  Persistent (does not go away) or recurrent (keeps coming back) cases of thrush may:   Need to be treated twice as long as the symptoms last.   Require treatment with both oral and topical antifungal medications.   People with weakened immune systems can take an antifungal medication on a continuous basis to prevent thrush infections.  It is important to treat conditions that make you more likely to get thrush, such as diabetes, human immunodeficiency virus (HIV), or cancer.   HOME CARE INSTRUCTIONS    If you are breast-feeding, you should clean your nipples with an antifungal medication, such as nystatin (Mycostatin). Dry your nipples after breast-feeding. Applying lanolin-containing body lotion may help relieve nipple soreness.   If you wear dentures and get thrush, remove dentures before going to bed, brush them vigorously, and soak in a solution of chlorhexidine gluconate or a product such as Polident or Efferdent.   Eating plain, unflavored yogurt that contains live cultures (check the label) can also help cure thrush. Yogurt helps healthy bacteria grow in the mouth. These bacteria stop the growth of the yeast that causes thrush.   Adults can treat thrush at home with gentian violet (1%), a dye that kills bacteria and fungi. It is available without a prescription. If there is no known cause for the infection or if gentian violet does not cure the thrush, you need to see your  caregiver.  Comfort measures  Measures can be taken to reduce the discomfort of thrush:   Drink cold liquids such as water or iced tea. Eat flavored ice treats or frozen juices.   Eat foods that are easy to swallow such as gelatin, ice cream, or custard.   If the patches are painful, try drinking from a straw.   Rinse your mouth several times a day with a warm saltwater rinse. You can make the saltwater mixture with 1 tsp (5 g) of salt in 8 fl oz (0.2 L) of warm water.  PROGNOSIS    Most cases of thrush are mild and clear up with the use of an antifungal mouth rinse or lozenges. Very mild cases of thrush may clear up without medical treatment. It usually takes about 14 days of treatment with an oral antifungal medication to cure more severe thrush infections. In some cases, thrush may last several weeks even with treatment.   If thrush goes untreated and does not go away by itself, it can spread to other parts of the body.   Thrush can spread to the throat, the vagina, or the skin. It rarely spreads   to other organs of the body.  Thrush is more likely to recur (come back) in:   People who use inhaled corticosteroids to treat asthma.   People who take antibiotic medications for a long time.   People who have false teeth.   People who have a weakened immune system.  RISKS AND COMPLICATIONS  Complications related to thrush are rare in healthy people.  There are several factors that can increase your risk of developing thrush.  Age  Older adults, especially those who have serious health problems, are more likely to develop thrush because their immune systems are likely to be weaker.  Behavior   The yeast that causes thrush can be spread by oral sex.   Heavy smoking can lower the body's ability to fight off infections. This makes thrush more likely to develop.  Other conditions   False teeth (dentures), braces, or a retainer that irritates the mouth make it hard to keep the mouth clean. An unclean mouth is  more likely to develop thrush than a clean mouth.   People with a weakened immune system, such as those who have diabetes or human immunodeficiency virus (HIV) or who are undergoing chemotherapy, have an increased risk for developing thrush.  Medications  Some medications can allow the fungus that causes thrush to grow uncontrolled. Common ones are:   Antibiotics, especially those that kill a wide range of organisms (broad-spectrum antibiotics), such as tetracycline commonly can cause thrush.   Birth control pills (oral contraceptives).   Medications that weaken the body's immune system, such as corticosteroids.  Environment  Exposure over time to certain environmental chemicals, such as benzene and pesticides, can weaken the body's immune system. This increases your risk for developing infections, including thrush.  SEEK IMMEDIATE MEDICAL CARE IF:   Your symptoms are getting worse or are not improving within 7 days of starting treatment.   You have symptoms of spreading infection, such as white patches on the skin outside of the mouth.   You are nursing and you have redness and pain in the nipples in spite of home treatment or if you have burning pain in the nipple area when you nurse. Your baby's mouth should also be examined to determine whether thrush is causing your symptoms.  Document Released: 02/05/2004 Document Revised: 08/04/2011 Document Reviewed: 05/17/2008  [employer] Patient Information 2014 [employer]

## 2013-03-11 NOTE — Telephone Encounter (Signed)
[facility] called and stated. Bilateral pulmonary nodule density, further evaluation with chest CT. And they are going to send the report over to.

## 2013-03-11 NOTE — Progress Notes (Addendum)
Subjective:    Patient ID: Marc Krueger, male    DOB: 1956-03-15, 57 y.o.   MRN: [id number]  HPI  Patient seen with persistent cough. He relates at least 6 weeks of cough which is mostly dry. Refer to recent note. History is he was treated with steroids and prescribed Tussionex and felt better following steroids with cough improved. He was also prescribed Nexium but insurance did not cover and he never filled.  Patient is nonsmoker. He's had some occasional nasal congestion and frequent postnasal drip symptoms. He is already taking Claritin and Flonase but has had some persistent drip. No purulent secretions. He does has occasional GERD but is currently not taking any medications.  He denies any recent dyspnea, hemoptysis, appetite, or weight changes. He is not aware of any wheezing. No known reactive airway problems. No recent antibiotics. No history of any chronic lung difficulties. No clear exacerbating factors. He does get temporary improvement with Tussionex.  He has developed some sore throat over the past few days.  Past Medical History  Diagnosis Date  . Allergy [id number]    seasonal alllergy  . Arthritis     osteoarthritis-knees  . Sinusitis     occ./occ. ringing in ears   Past Surgical History  Procedure Laterality Date  . Foot sugery Bilateral 1980  . Lasik    . 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 He does not have any smokeless tobacco history on file. He reports that he drinks alcohol. He reports that he does not use illicit drugs. family history includes Arthritis in his brother, brother, and mother; Diabetes in his brother; Heart disease in his mother; Lung cancer in his sister. No Known Allergies    Review of Systems  Constitutional: Negative for fever, chills, appetite change and  unexpected weight change.  HENT: Positive for postnasal drip and sore throat.   Respiratory: Positive for cough. Negative for shortness of breath and wheezing.   Cardiovascular: Negative for chest pain and leg swelling.  Gastrointestinal: Negative for abdominal pain.  Neurological: Negative for dizziness and headaches.       Objective:   Physical Exam  Constitutional: He appears well-developed and well-nourished.  HENT:  Right Ear: External ear normal.  Left Ear: External ear normal.  Patient has some scattered whitish patches soft palate and posterior pharynx. These did scrape off with tongue depressor. These appear compatible with likely thrush  Neck: Neck supple. No thyromegaly present.  Cardiovascular: Normal rate and regular rhythm.   No murmur heard. Pulmonary/Chest: Effort normal and breath sounds normal. No respiratory distress. He has no wheezes. He has no rales.  Musculoskeletal: He exhibits no edema.  Lymphadenopathy:    He has no cervical adenopathy.          Assessment & Plan:  Chronic cough estimated 6 weeks duration. Differential is post viral, GERD related, postnasal drip related vs other. He does have evidence for likely thrush likely related to recent steroids. No other risk factors. Start pantoprazole 40 mg once daily. Add Astelin nasal 2 sprays per nostril once daily. Continue Claritin. Nystatin oral suspension 4 times daily. Instructions given for use. Obtain chest x-ray given duration of cough.  CXR multiple pulmonary nodules.  Will set up CT chest to further evaluate. Patient has several small bilateral pulmonary nodules and 2 cm  left lower lobe nodule with recommended followup CT chest without contrast in 3 months. We will set up. Patient has been notified and agrees with this plan. His cough is actually somewhat better today.

## 2013-03-13 NOTE — Addendum Note (Signed)
Addended by: Kristian Covey on: 03/13/2013 07:07 PM   Modules accepted: Orders

## 2013-03-17 ENCOUNTER — Ambulatory Visit (INDEPENDENT_AMBULATORY_CARE_PROVIDER_SITE_OTHER)
Admission: RE | Admit: 2013-03-17 | Discharge: 2013-03-17 | Disposition: A | Discharge status: Home / Self Care | Payer: BC Managed Care – PPO | Source: Ambulatory Visit | Attending: Family Medicine | Admitting: Family Medicine

## 2013-03-17 DIAGNOSIS — R918 Other nonspecific abnormal finding of lung field: Secondary | ICD-10-CM

## 2013-03-17 IMAGING — CT CT CHEST W/ CM
2 of 3 series · 15 of 36 positions shown, 18 images · IV contrast (Omnipaque 300)
Comparison: Chest radiograph on [DATE]

CLINICAL DATA: Bilateral pulmonary nodular density seen on chest
radiograph. Cough. Wheezing.

EXAM:
CT CHEST WITH CONTRAST
TECHNIQUE: Multidetector CT imaging of the chest was performed during
intravenous contrast administration.
CONTRAST:  80mL OMNIPAQUE IOHEXOL 300 MG/ML  SOLN

[Series 2: chest routine with · axial · 0.81mm/px · z∈[+1128,+1433]mm · 12 of 73 slices shown, 15 images]
[im 6/73  mediastinal]
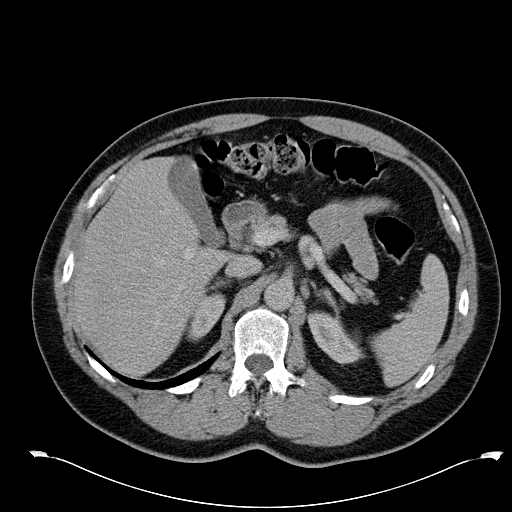
[im 6/73  lung]
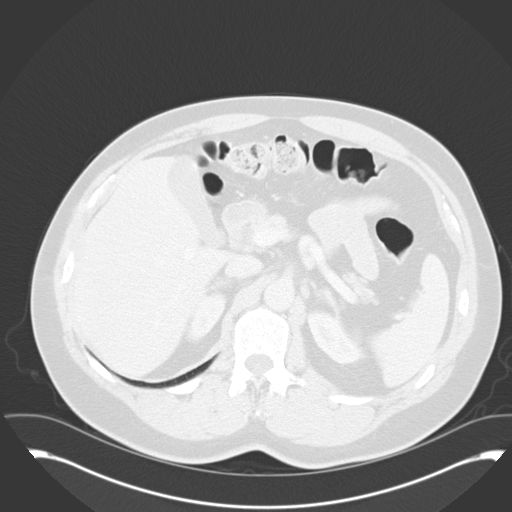
[im 11/73  lung]
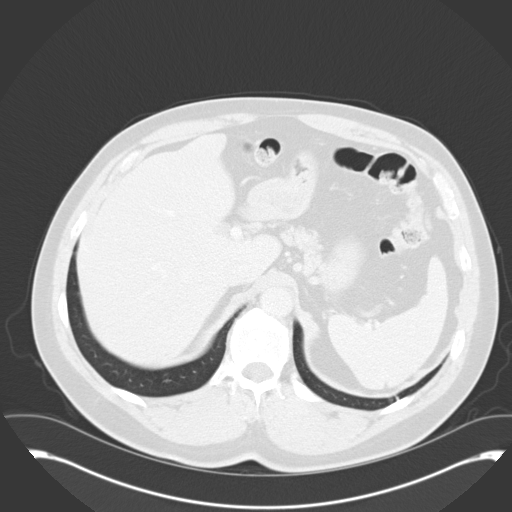
[im 17/73  lung]
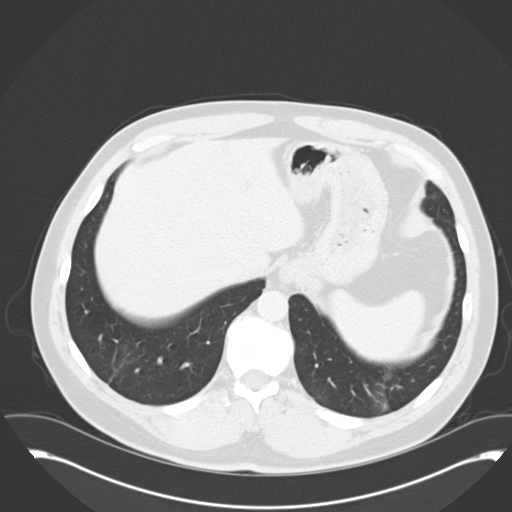
[im 22/73  lung]
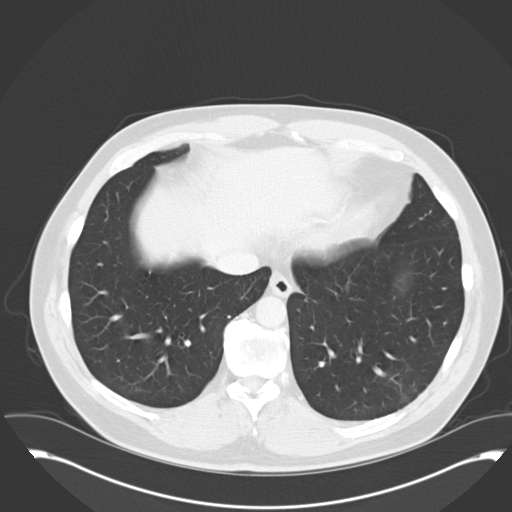
[im 27/73  mediastinal]
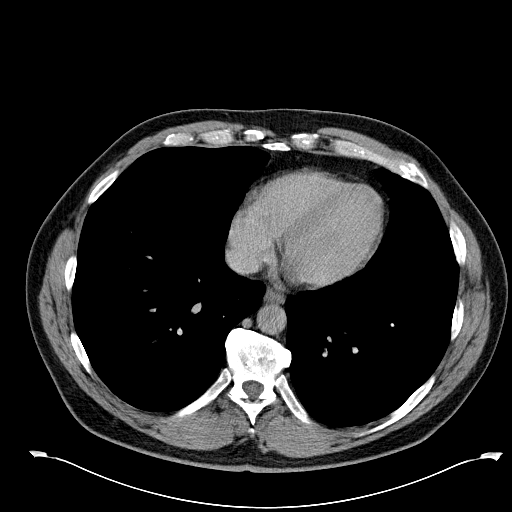
[im 27/73  lung]
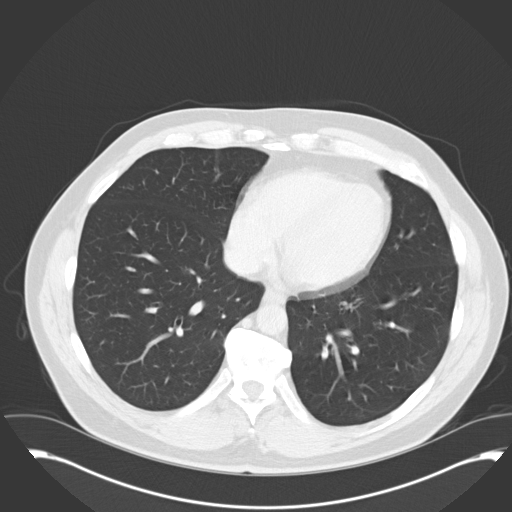
[im 33/73  lung]
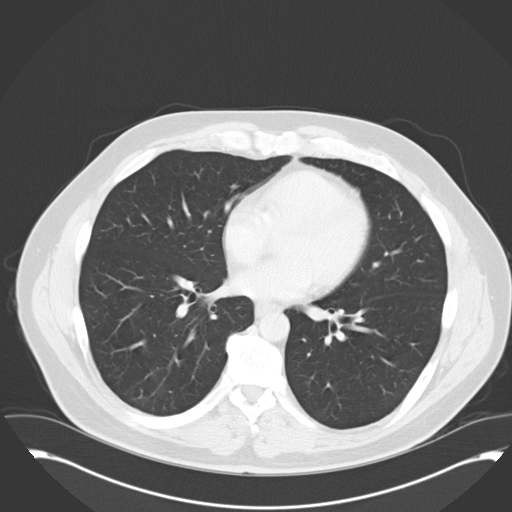
[im 41/73  lung]
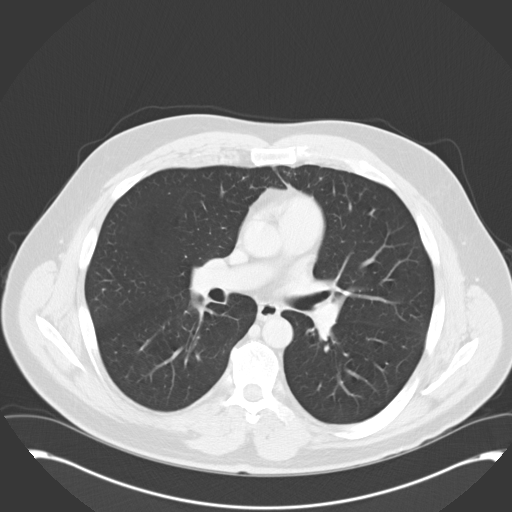
[im 46/73  lung]
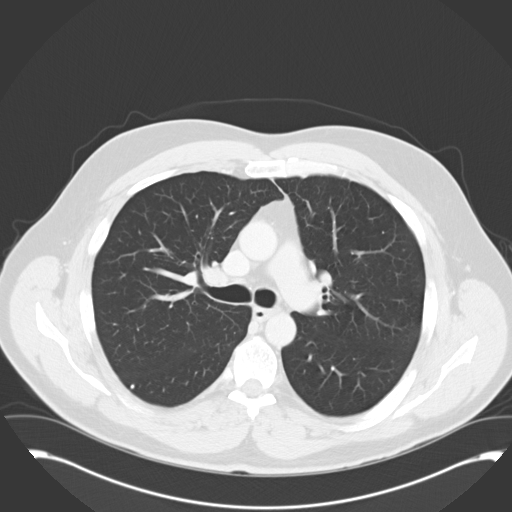
[im 51/73  mediastinal]
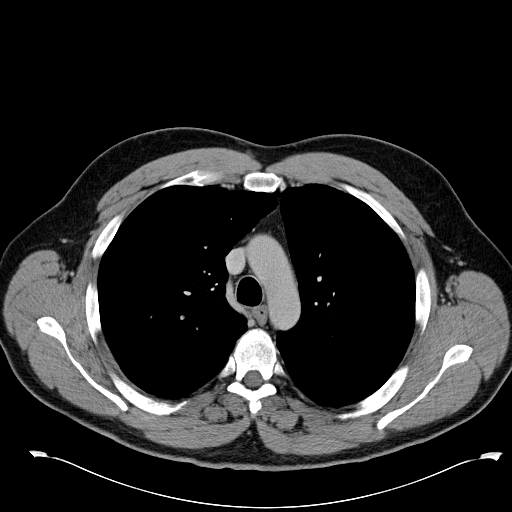
[im 51/73  lung]
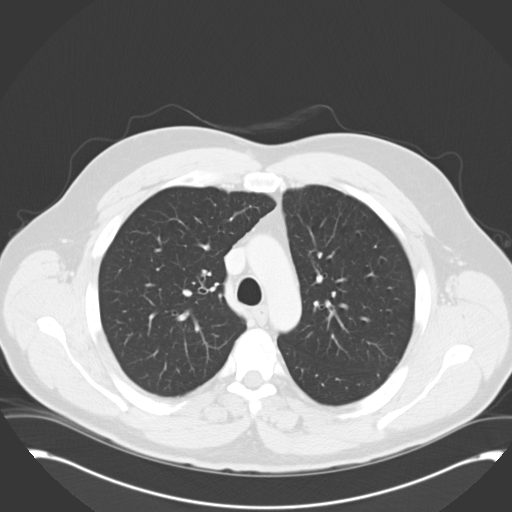
[im 57/73  lung]
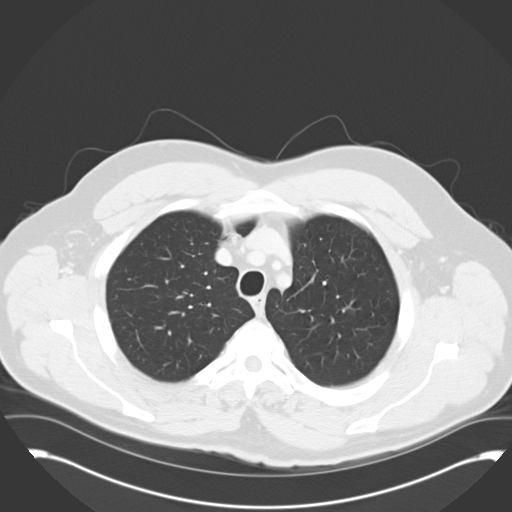
[im 62/73  lung]
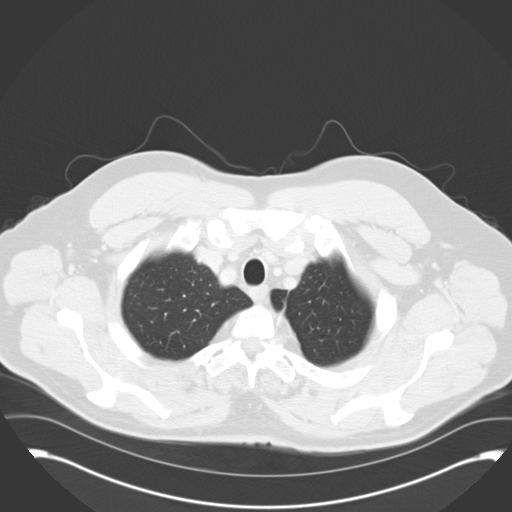
[im 67/73  lung]
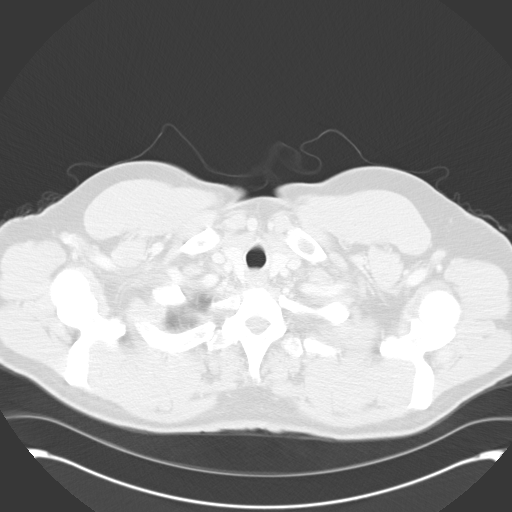

[Series 602: cor · coronal · 0.81mm/px · 3 of 139 slices shown]
[im 28/139  lung]
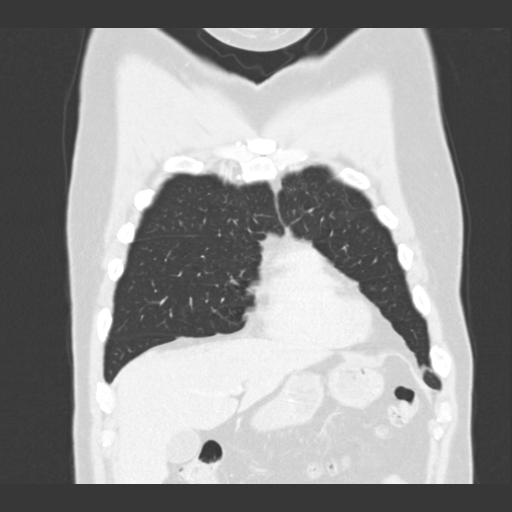
[im 56/139  lung]
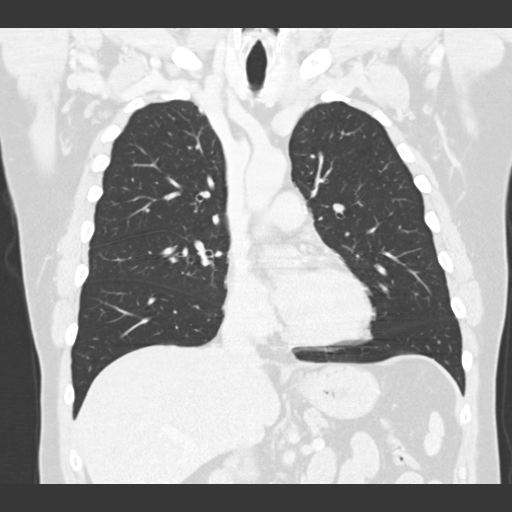
[im 83/139  lung]
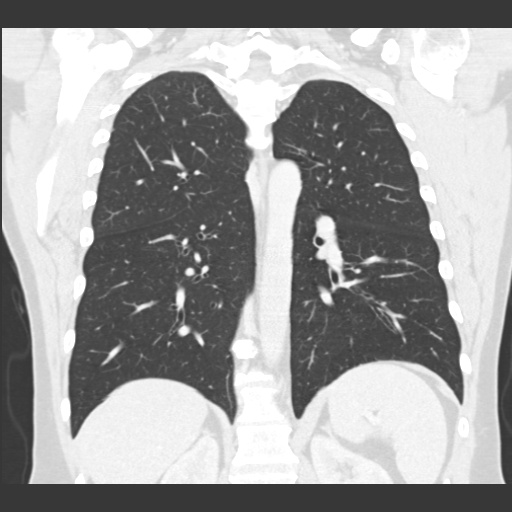

[15 of 36 positions shown; findings below may reference images not displayed]

FINDINGS: Tiny calcified granulomas are seen in both lungs which correspond to
the nodular densities seen on recent chest radiograph. No
noncalcified pulmonary nodules or masses are identified.

A focus of ground-glass opacity is noted in the posterior left lower
lobe measuring approximate 2 cm on image 51. No solid component
visualized. Differential diagnosis includes inflammatory process and
low-grade adenocarcinoma in situ.

No evidence of pleural or pericardial effusion. No evidence of hilar
or mediastinal masses. No adenopathy seen elsewhere within the
thorax. Both adrenal glands are normal in appearance. Tiny 1-2 mm
nonobstructing calculus noted in the upper pole of the right kidney.
IMPRESSION: Tiny bilateral calcified granulomas corresponding with nodular
density seen on recent chest radiograph.

2 cm ground-glass nodule in the posterior left lower lobe.
Differential diagnosis includes inflammatory process and low-grade
adenocarcinoma in situ. Initial follow-up by chest CT without
contrast is recommended in 3 months to confirm persistence. This
recommendation follows the consensus statement: Recommendations for
the Management of Subsolid Pulmonary Nodules Detected at CT: A
Statement from the [HOSPITAL] as published in Radiology

## 2013-03-17 MED ORDER — IOHEXOL 300 MG/ML  SOLN
80.0000 mL | Freq: Once | INTRAMUSCULAR | Status: AC | PRN
  Administered 2013-03-17: 80 mL via INTRAVENOUS

## 2013-03-17 NOTE — Addendum Note (Signed)
Addended by: Kristian Covey on: 03/17/2013 06:12 PM   Modules accepted: Orders

## 2013-05-02 ENCOUNTER — Encounter: Payer: Self-pay | Admitting: Family Medicine

## 2013-05-02 ENCOUNTER — Ambulatory Visit (INDEPENDENT_AMBULATORY_CARE_PROVIDER_SITE_OTHER): Payer: BC Managed Care – PPO | Admitting: Family Medicine

## 2013-05-02 VITALS — BP 124/74 | HR 80 | Temp 98.5°F | Wt 240.0 lb

## 2013-05-02 DIAGNOSIS — R059 Cough, unspecified: Secondary | ICD-10-CM

## 2013-05-02 DIAGNOSIS — R05 Cough: Secondary | ICD-10-CM

## 2013-05-02 MED ORDER — ALBUTEROL SULFATE HFA 108 (90 BASE) MCG/ACT IN AERS: 2.0000 | INHALATION_SPRAY | Freq: Four times a day (QID) | RESPIRATORY_TRACT | Status: DC | PRN

## 2013-05-02 NOTE — Patient Instructions (Signed)
-  continue current medications  -try the albuterol inhaler  -We placed a referral for you as discussed to the pulmonologist for your chronic cough and the pulmonary nodules. It usually takes about 1-2 weeks to process and schedule this referral. If you have not heard from [place] regarding this appointment in 2 weeks please contact our office.

## 2013-05-02 NOTE — Progress Notes (Signed)
Pre visit review using our clinic review tool, if applicable. No additional management support is needed unless otherwise documented below in the visit note. 

## 2013-05-02 NOTE — Progress Notes (Signed)
Chief Complaint  Patient presents with  . Cough    HPI:  Follow up cough and PND: -seen by Adline Mango, then Dr. Caryl Never for this in October  -tx with steroid, tussinex, flonase, claritin, nexium -CT ordered by Dr. Caryl Never with pulm nodules and per notes repeat CT in 3 months advised -pt reports: occ gerd, cough, occ PND - patient reports cough has improved but continues to have cough daily and mild wheezing - did use grandsons inhaler which did not help -denies: SOB, fevers, weight loss, hemoptysis, TB risks, travel -has dust allergies -wants to see pulmonologist  ROS: See pertinent positives and negatives per HPI.  Past Medical History  Diagnosis Date  . Allergy 11-23-1012    seasonal alllergy  . Arthritis     osteoarthritis-knees  . Sinusitis     occ./occ. ringing in ears    Past Surgical History  Procedure Laterality Date  . Foot sugery Bilateral 1980  . Lasik    . 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 Arthritis Mother   . Heart disease Mother   . Lung cancer Sister   . Arthritis Brother   . Diabetes Brother   . Arthritis Brother     History   Social History  . Marital Status: Married    Spouse Name: N/A    Number of Children: N/A  . Years of Education: N/A   Social History Main Topics  . Smoking status: Never Smoker   . Smokeless tobacco: None  . Alcohol Use: Yes     Comment: 1 per week  . Drug Use: No  . Sexual Activity: Yes   Other Topics Concern  . None   Social History Narrative  . None    Current outpatient prescriptions:azelastine (ASTELIN) 137 MCG/SPRAY nasal spray, Place 2 sprays into the nose 2 (two) times daily. Use in each nostril as directed, Disp: 30 mL, Rfl: 12;  fluticasone (FLONASE) 50 MCG/ACT nasal spray, Place 2 sprays into the nose  daily., Disp: 16 g, Rfl: 1;  loratadine (CLARITIN) 10 MG tablet, Take 10 mg by mouth daily., Disp: , Rfl:  Multiple Vitamin (MULTIVITAMIN) tablet, Take 1 tablet by mouth daily., Disp: , Rfl: ;  omeprazole (PRILOSEC) 40 MG capsule, Take 40 mg by mouth daily., Disp: , Rfl: ;  albuterol (PROVENTIL HFA;VENTOLIN HFA) 108 (90 BASE) MCG/ACT inhaler, Inhale 2 puffs into the lungs every 6 (six) hours as needed for wheezing or shortness of breath., Disp: 1 Inhaler, Rfl: 0  EXAM:  Filed Vitals:   05/02/13 1404  BP: 124/74  Pulse: 80  Temp: 98.5 F (36.9 C)    Body mass index is 33.49 kg/(m^2).  GENERAL: vitals reviewed and listed above, alert, oriented, appears well hydrated and in no acute distress  HEENT: atraumatic, conjunttiva clear, no obvious abnormalities on inspection of external nose and ears  NECK: no obvious masses on inspection  LUNGS: clear to auscultation bilaterally, no wheezes, rales or rhonchi, good air movement  CV: HRRR, no peripheral edema  MS: moves all extremities without noticeable abnormality  PSYCH: pleasant and cooperative, no obvious depression or anxiety  ASSESSMENT AND PLAN:  Discussed the following assessment and plan:  Chronic cough - Plan: Ambulatory referral to Pulmonology, albuterol (PROVENTIL HFA;VENTOLIN HFA) 108 (90 BASE) MCG/ACT inhaler  -discussed his  chronic cough and potential etiologies including serious pathology - likely pnd related to allergies possible mild asthma - but in light of pulm nodules, persistent cough despite tx he would like to see pulm and referral placed -cont current medications and trial of alb in the meantime -Patient advised to return or notify a doctor immediately if symptoms worsen or persist or new concerns arise.  Patient Instructions  -continue current medications  -try the albuterol inhaler  -We placed a referral for you as discussed to the pulmonologist for your chronic cough and the pulmonary nodules. It usually  takes about 1-2 weeks to process and schedule this referral. If you have not heard from [place] regarding this appointment in 2 weeks please contact our office.      Marc Basque R.

## 2013-05-10 ENCOUNTER — Encounter: Payer: Self-pay | Admitting: Emergency Medicine

## 2013-05-10 ENCOUNTER — Ambulatory Visit (INDEPENDENT_AMBULATORY_CARE_PROVIDER_SITE_OTHER): Payer: BC Managed Care – PPO | Admitting: Emergency Medicine

## 2013-05-10 VITALS — BP 160/82 | HR 97 | Ht 71.0 in | Wt 243.8 lb

## 2013-05-10 DIAGNOSIS — R053 Chronic cough: Secondary | ICD-10-CM

## 2013-05-10 DIAGNOSIS — R05 Cough: Secondary | ICD-10-CM

## 2013-05-10 DIAGNOSIS — R0982 Postnasal drip: Secondary | ICD-10-CM

## 2013-05-10 DIAGNOSIS — J309 Allergic rhinitis, unspecified: Secondary | ICD-10-CM

## 2013-05-10 DIAGNOSIS — R911 Solitary pulmonary nodule: Secondary | ICD-10-CM

## 2013-05-10 HISTORY — DX: Chronic cough: R05.3

## 2013-05-10 MED ORDER — LORATADINE 10 MG PO TABS: 10.0000 mg | ORAL_TABLET | Freq: Every day | ORAL | Status: DC

## 2013-05-10 MED ORDER — HYDROCODONE-HOMATROPINE 5-1.5 MG/5ML PO SYRP: 5.0000 mL | ORAL_SOLUTION | Freq: Four times a day (QID) | ORAL | Status: DC | PRN

## 2013-05-10 MED ORDER — OMEPRAZOLE 40 MG PO CPDR: 40.0000 mg | DELAYED_RELEASE_CAPSULE | Freq: Every day | ORAL | Status: DC

## 2013-05-10 MED ORDER — BENZONATATE 200 MG PO CAPS: 200.0000 mg | ORAL_CAPSULE | Freq: Three times a day (TID) | ORAL | Status: DC | PRN

## 2013-05-10 MED ORDER — FLUTICASONE PROPIONATE 50 MCG/ACT NA SUSP: 2.0000 | Freq: Every day | NASAL | Status: DC

## 2013-05-10 MED ORDER — AZELASTINE HCL 0.1 % NA SOLN: 2.0000 | Freq: Two times a day (BID) | NASAL | Status: DC

## 2013-05-10 NOTE — Assessment & Plan Note (Signed)
Suspect that this is primarily UA in nature, started by his URI and being driven by rhinitis and GERD. He has been treated for both - fluticasone seems to help him the most. Consider also asthma, but no dyspnea, wheeze, etc.  - concentrate on PND, GERD, cyclical cough protocol w suppression and voice rest - consider PFT +/- FOB in future if cough persists.

## 2013-05-10 NOTE — Progress Notes (Signed)
Subjective:    Patient ID: Marc Krueger, male    DOB: 1955/08/18, 57 y.o.   MRN: [id number]  HPI 57 yo man, never smoker, hx allergies, referred by Dr Selena Batten for chronic cough. Has been having trouble with cough ever since catching URI about 3 months ago. All sx got better except cough. Has had astelin alternated w fluticasone, has been on loratadine the entire time. He has a hx GERD, so omeprazole was added. He tried albuterol but developed thrush. He has no SOB. Triggers can be cold air, exercise, cold drink. Cough is non-productive. A Ct scan 10/'14 shows a 2cm GGI LLL, some calcified granulomas.    Review of Systems  Constitutional: Negative for fever and unexpected weight change.  HENT: Positive for postnasal drip and sinus pressure. Negative for congestion, dental problem, ear pain, nosebleeds, sneezing, sore throat and trouble swallowing.   Eyes: Negative for redness and itching.  Respiratory: Positive for cough and wheezing. Negative for chest tightness and shortness of breath.   Cardiovascular: Negative for palpitations and leg swelling.  Gastrointestinal: Negative for nausea and vomiting.  Genitourinary: Negative for dysuria.  Musculoskeletal: Negative for joint swelling.  Skin: Negative for rash.  Neurological: Negative for headaches.  Hematological: Does not bruise/bleed easily.  Psychiatric/Behavioral: Negative for dysphoric mood. The patient is not nervous/anxious.    Past Medical History  Diagnosis Date  . Allergy 11-23-1012    seasonal alllergy  . Arthritis     osteoarthritis-knees  . Sinusitis     occ./occ. ringing in ears     Family History  Problem Relation Age of Onset  . Arthritis Mother   . Heart disease Mother   . Lung cancer Sister   . Arthritis Brother   . Diabetes Brother   . Arthritis Brother      History   Social History  . Marital Status: Married    Spouse Name: N/A    Number of Children: N/A  . Years of Education: N/A   Occupational  History  . Not on file.   Social History Main Topics  . Smoking status: Never Smoker   . Smokeless tobacco: Not on file  . Alcohol Use: Yes     Comment: 1 per week  . Drug Use: No  . Sexual Activity: Yes   Other Topics Concern  . Not on file   Social History Narrative  . No narrative on file  has lived in [place], [place], [place].     No Known Allergies   Outpatient Prescriptions Prior to Visit  Medication Sig Dispense Refill  . albuterol (PROVENTIL HFA;VENTOLIN HFA) 108 (90 BASE) MCG/ACT inhaler Inhale 2 puffs into the lungs every 6 (six) hours as needed for wheezing or shortness of breath.  1 Inhaler  0  . azelastine (ASTELIN) 137 MCG/SPRAY nasal spray Place 2 sprays into the nose 2 (two) times daily. Use in each nostril as directed  30 mL  12  . Multiple Vitamin (MULTIVITAMIN) tablet Take 1 tablet by mouth daily.      . fluticasone (FLONASE) 50 MCG/ACT nasal spray Place 2 sprays into the nose daily.  16 g  1  . loratadine (CLARITIN) 10 MG tablet Take 10 mg by mouth daily.      Marland Kitchen omeprazole (PRILOSEC) 40 MG capsule Take 40 mg by mouth daily.       No facility-administered medications prior to visit.         Objective:   Physical Exam Filed Vitals:  05/10/13 1047  BP: 160/82  Pulse: 97  Height: 5\' 11"  (1.803 m)  Weight: 243 lb 12.8 oz (110.587 kg)  SpO2: 98%   Gen: Pleasant, well-nourished, in no distress,  normal affect  ENT: No lesions,  mouth clear,  oropharynx clear, no postnasal drip  Neck: No JVD, no TMG, no carotid bruits  Lungs: No use of accessory muscles, no dullness to percussion, clear without rales or rhonchi  Cardiovascular: RRR, heart sounds normal, no murmur or gallops, no peripheral edema  Musculoskeletal: No deformities, no cyanosis or clubbing  Neuro: alert, non focal  Skin: Warm, no lesions or rashes     Assessment & Plan:  Chronic cough Suspect that this is primarily UA in nature, started by his URI and being driven by  rhinitis and GERD. He has been treated for both - fluticasone seems to help him the most. Consider also asthma, but no dyspnea, wheeze, etc.  - concentrate on PND, GERD, cyclical cough protocol w suppression and voice rest - consider PFT +/- FOB in future if cough persists.

## 2013-05-10 NOTE — Patient Instructions (Signed)
Please continue your claritin 10mg  daily, fluticasone nasal spray twice a day Use astelin nasal spray 2-3 x a day if needed for drainage Continue your omeprazole 40mg  daily Use hycodan and tessalon perles for cough suppression.  Practice voice rest as per the Cyclical Cough Protocol Repeat CT scan in January 2015 Follow with Dr Delton Coombes in 3 months after the Ct scan, or sooner if you have any problems.

## 2013-06-27 ENCOUNTER — Other Ambulatory Visit: Payer: Self-pay

## 2013-06-28 ENCOUNTER — Ambulatory Visit: Payer: Self-pay | Admitting: Emergency Medicine

## 2013-08-15 ENCOUNTER — Ambulatory Visit (INDEPENDENT_AMBULATORY_CARE_PROVIDER_SITE_OTHER)
Admission: RE | Admit: 2013-08-15 | Discharge: 2013-08-15 | Disposition: A | Discharge status: Home / Self Care | Payer: BC Managed Care – PPO | Source: Ambulatory Visit | Attending: Emergency Medicine | Admitting: Emergency Medicine

## 2013-08-15 DIAGNOSIS — R911 Solitary pulmonary nodule: Secondary | ICD-10-CM

## 2013-08-15 IMAGING — CT CT CHEST W/O CM
2 of 4 series · 15 of 36 positions shown, 18 images · IV contrast (Omnipaque 300)
Comparison: CT CHEST W/CM dated [DATE]

CLINICAL DATA: Followup lung nodule.

EXAM:
CT CHEST WITHOUT CONTRAST
TECHNIQUE: Multidetector CT imaging of the chest was performed following the
standard protocol without IV contrast.

[Series 2: chest routine with · axial · 0.78mm/px · z∈[-348,-54]mm · 12 of 71 slices shown, 15 images]
[im 6/71  mediastinal]
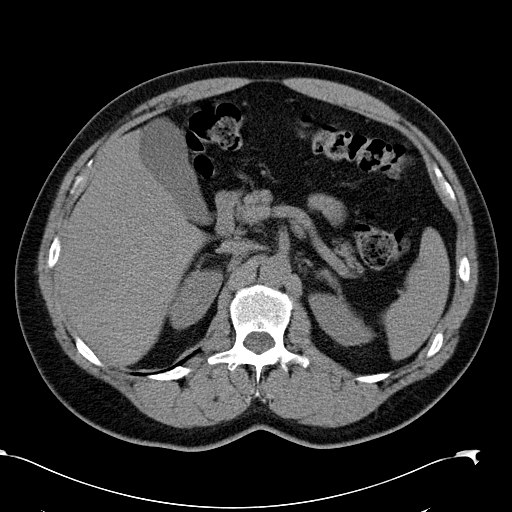
[im 6/71  lung]
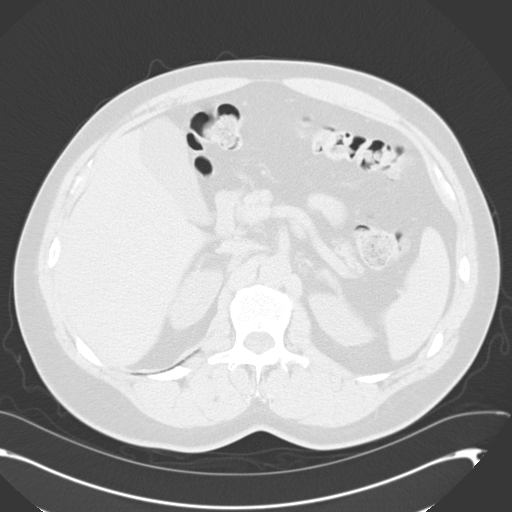
[im 11/71  lung]
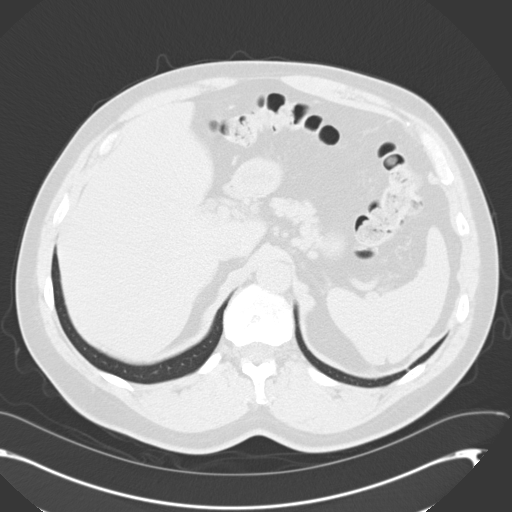
[im 17/71  lung]
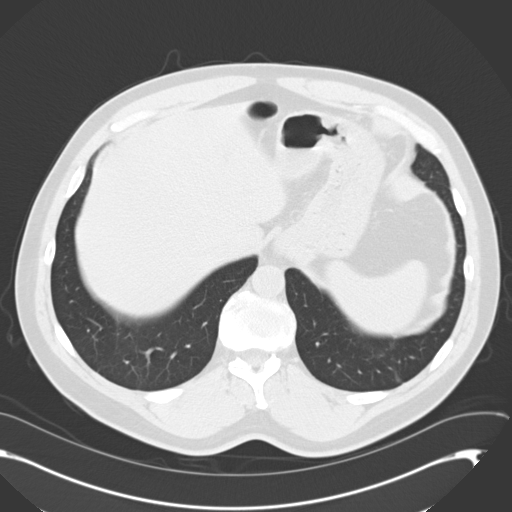
[im 22/71  lung]
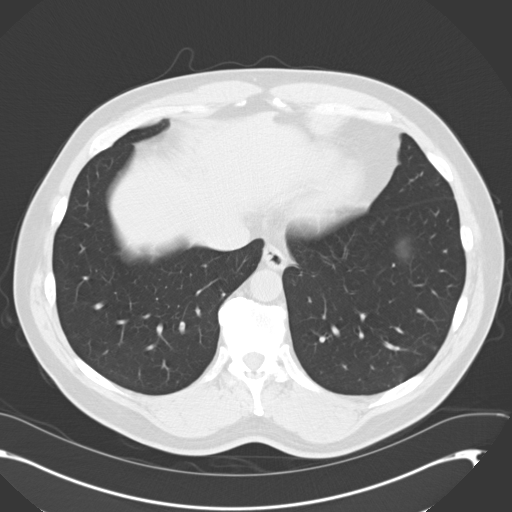
[im 27/71  mediastinal]
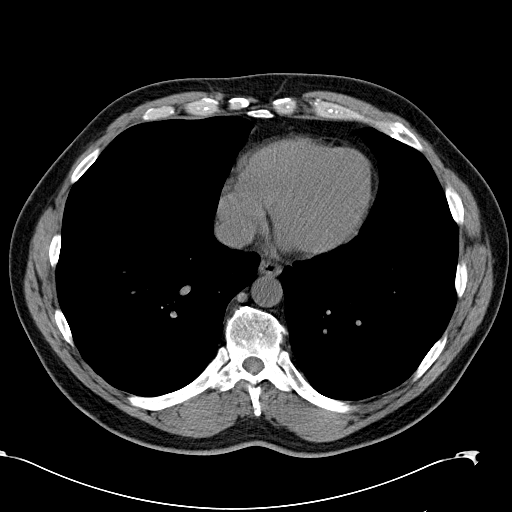
[im 27/71  lung]
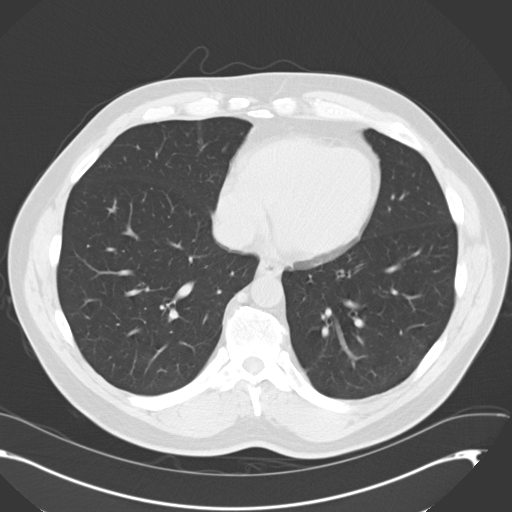
[im 33/71  lung]
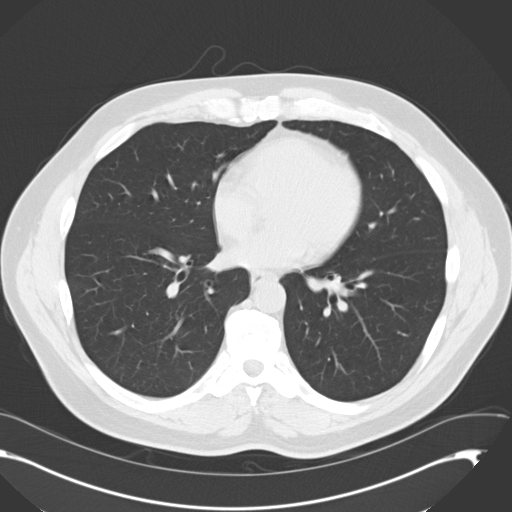
[im 38/71  lung]
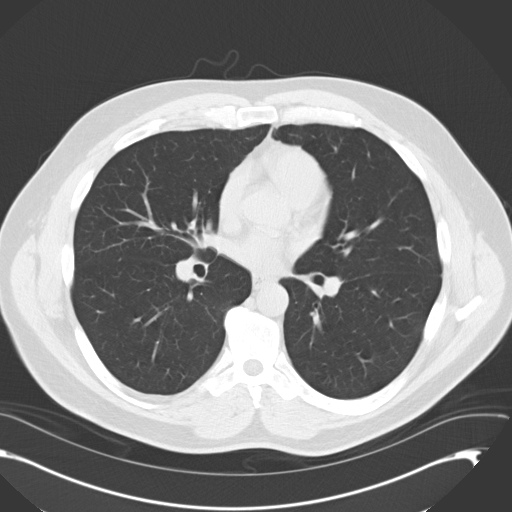
[im 44/71  lung]
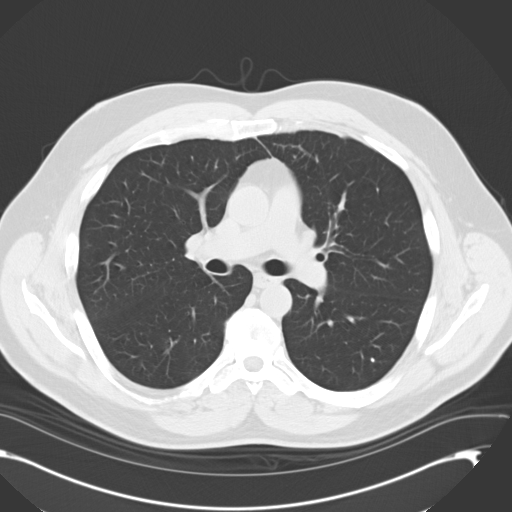
[im 49/71  mediastinal]
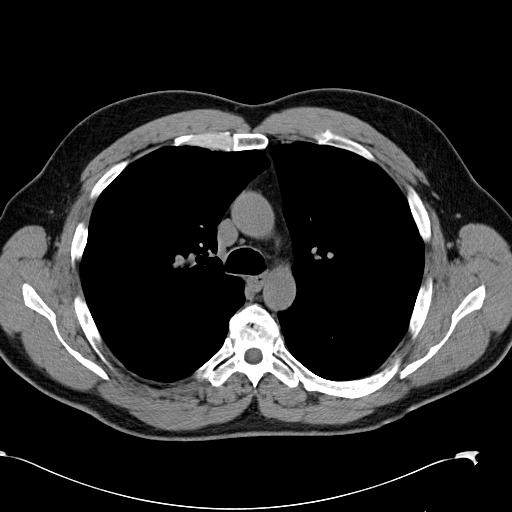
[im 49/71  lung]
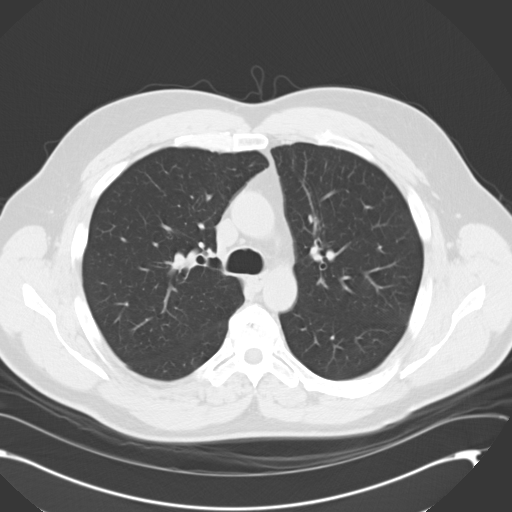
[im 54/71  lung]
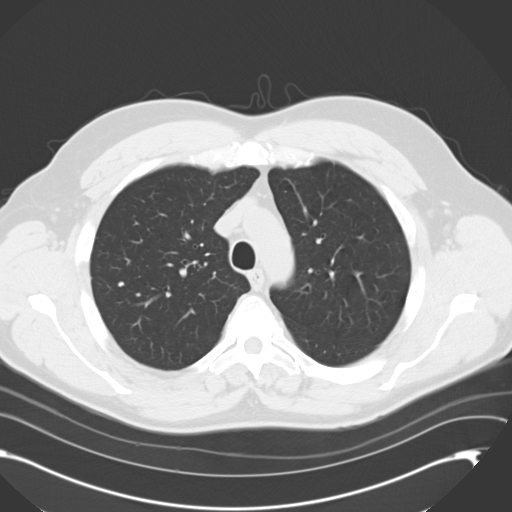
[im 60/71  lung]
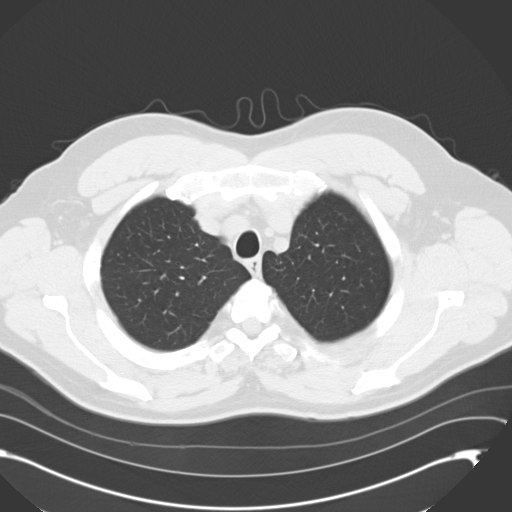
[im 65/71  lung]
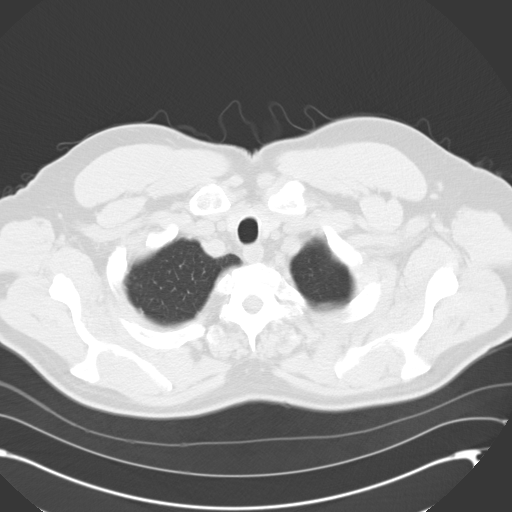

[Series 602: cor · coronal · 0.78mm/px · 3 of 125 slices shown]
[im 25/125  lung]
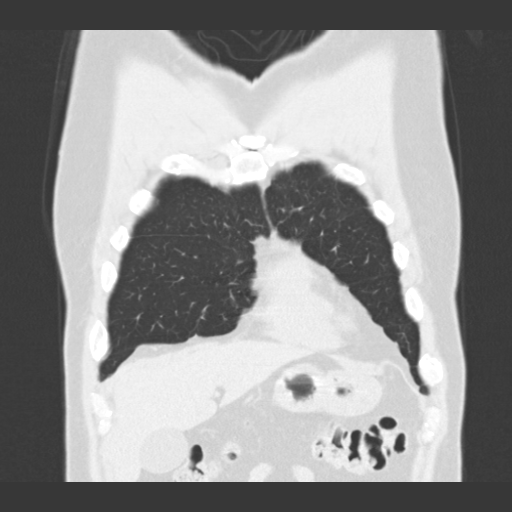
[im 50/125  lung]
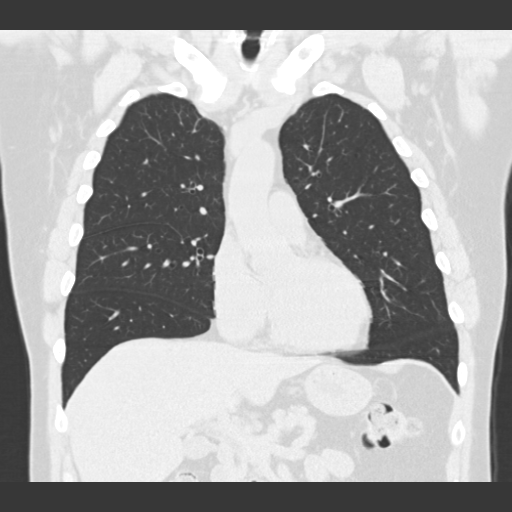
[im 75/125  lung]
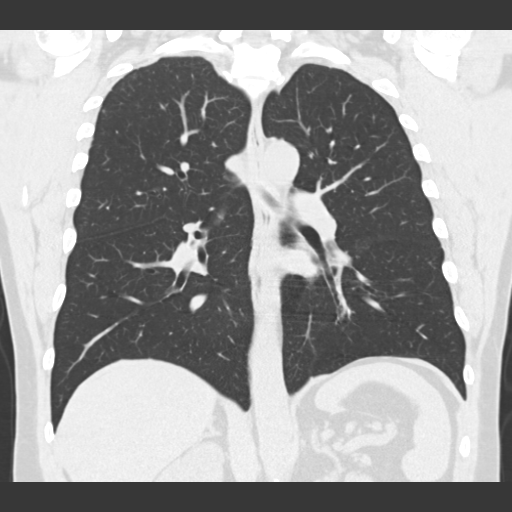

[15 of 36 positions shown; findings below may reference images not displayed]

FINDINGS: No pathologically enlarged mediastinal or axillary lymph nodes.
Hilar regions are difficult to definitively evaluate but appear
grossly unremarkable. Coronary artery calcification. Heart size
normal. No pericardial effusion.

Scattered calcified granulomas. Mild scattered scarring in the
lungs. Ground-glass in the left lower lobe does not appear masslike
but rather due to scarring. No pleural fluid. Airway is
unremarkable.

Incidental imaging of the upper abdomen shows the visualized
portions of the liver, gallbladder and adrenal glands to be grossly
unremarkable. Tiny stones in the kidneys bilaterally. Visualized
portions of the spleen, pancreas, stomach and bowel are grossly
unremarkable. No upper abdominal adenopathy. No worrisome lytic or
sclerotic lesions. Degenerative changes are seen in the spine.
IMPRESSION: 1. Ground-glass in the left lower lobe is amorphous and does not
have masslike features. Scarring is favored.
2. Coronary artery calcification.
3. Tiny bilateral renal stones.

## 2013-08-16 ENCOUNTER — Ambulatory Visit (INDEPENDENT_AMBULATORY_CARE_PROVIDER_SITE_OTHER): Payer: BC Managed Care – PPO | Admitting: Emergency Medicine

## 2013-08-16 ENCOUNTER — Encounter: Payer: Self-pay | Admitting: Emergency Medicine

## 2013-08-16 VITALS — BP 160/100 | HR 74 | Ht 71.0 in | Wt 240.0 lb

## 2013-08-16 DIAGNOSIS — R918 Other nonspecific abnormal finding of lung field: Secondary | ICD-10-CM

## 2013-08-16 DIAGNOSIS — R053 Chronic cough: Secondary | ICD-10-CM

## 2013-08-16 DIAGNOSIS — R059 Cough, unspecified: Secondary | ICD-10-CM

## 2013-08-16 DIAGNOSIS — R05 Cough: Secondary | ICD-10-CM

## 2013-08-16 HISTORY — DX: Other nonspecific abnormal finding of lung field: R91.8

## 2013-08-16 MED ORDER — BENZONATATE 200 MG PO CAPS: 200.0000 mg | ORAL_CAPSULE | Freq: Three times a day (TID) | ORAL | Status: DC | PRN

## 2013-08-16 NOTE — Progress Notes (Signed)
Subjective:    Patient ID: Marc Krueger, male    DOB: 04-21-1956, 59 y.o.   MRN: [id number]  Cough Associated symptoms include postnasal drip and wheezing. Pertinent negatives include no ear pain, eye redness, fever, headaches, rash, sore throat or shortness of breath.   58 yo man, never smoker, hx allergies, referred by Dr Maudie Mercury for chronic cough. Has been having trouble with cough ever since catching URI about 3 months ago. All sx got better except cough. Has had astelin alternated w fluticasone, has been on loratadine the entire time. He has a hx GERD, so omeprazole was added. He tried albuterol but developed thrush. He has no SOB. Triggers can be cold air, exercise, cold drink. Cough is non-productive. A Ct scan 10/'14 shows a 2cm GGI LLL, some calcified granulomas.   ROV 08/16/13 -- follows up for his cough and also abnormal CT scan chest in 10/14. He has a repeat CT scan from 08/15/13 > scattered calcified granulomas, stable GGI likely scar. He has taken omeprazole daily.    Review of Systems  Constitutional: Negative for fever and unexpected weight change.  HENT: Positive for postnasal drip and sinus pressure. Negative for congestion, dental problem, ear pain, nosebleeds, sneezing, sore throat and trouble swallowing.   Eyes: Negative for redness and itching.  Respiratory: Positive for cough and wheezing. Negative for chest tightness and shortness of breath.   Cardiovascular: Negative for palpitations and leg swelling.  Gastrointestinal: Negative for nausea and vomiting.  Genitourinary: Negative for dysuria.  Musculoskeletal: Negative for joint swelling.  Skin: Negative for rash.  Neurological: Negative for headaches.  Hematological: Does not bruise/bleed easily.  Psychiatric/Behavioral: Negative for dysphoric mood. The patient is not nervous/anxious.    Past Medical History  Diagnosis Date  . Allergy 11-23-1012    seasonal alllergy  . Arthritis     osteoarthritis-knees  . Sinusitis      occ./occ. ringing in ears     Family History  Problem Relation Age of Onset  . Arthritis Mother   . Heart disease Mother   . Lung cancer Sister   . Arthritis Brother   . Diabetes Brother   . Arthritis Brother      History   Social History  . Marital Status: Married    Spouse Name: N/A    Number of Children: N/A  . Years of Education: N/A   Occupational History  . Not on file.   Social History Main Topics  . Smoking status: Never Smoker   . Smokeless tobacco: Not on file  . Alcohol Use: Yes     Comment: 1 per week  . Drug Use: No  . Sexual Activity: Yes   Other Topics Concern  . Not on file   Social History Narrative  . No narrative on file  has lived in [place], [place], including the [place].     No Known Allergies   Outpatient Prescriptions Prior to Visit  Medication Sig Dispense Refill  . albuterol (PROVENTIL HFA;VENTOLIN HFA) 108 (90 BASE) MCG/ACT inhaler Inhale 2 puffs into the lungs every 6 (six) hours as needed for wheezing or shortness of breath.  1 Inhaler  0  . loratadine (CLARITIN) 10 MG tablet Take 1 tablet (10 mg total) by mouth daily.  30 tablet  6  . Multiple Vitamin (MULTIVITAMIN) tablet Take 1 tablet by mouth daily.      Marland Kitchen omeprazole (PRILOSEC) 40 MG capsule Take 1 capsule (40 mg total) by mouth daily.  30 capsule  6  . azelastine (ASTELIN) 137 MCG/SPRAY nasal spray Place 2 sprays into both nostrils 2 (two) times daily. Use in each nostril as directed  30 mL  12  . fluticasone (FLONASE) 50 MCG/ACT nasal spray Place 2 sprays into both nostrils daily.  16 g  6  . HYDROcodone-acetaminophen (NORCO/VICODIN) 5-325 MG per tablet Take 1 tablet by mouth every 6 (six) hours as needed for moderate pain.      Marland Kitchen HYDROcodone-homatropine (HYCODAN) 5-1.5 MG/5ML syrup Take 5 mLs by mouth every 6 (six) hours as needed for cough.  240 mL  0  . azelastine (ASTELIN) 137 MCG/SPRAY nasal spray Place 2 sprays into the nose 2 (two) times daily. Use in each nostril as directed   30 mL  12  . benzonatate (TESSALON) 200 MG capsule Take 1 capsule (200 mg total) by mouth 3 (three) times daily as needed for cough.  30 capsule  1   No facility-administered medications prior to visit.         Objective:   Physical Exam Filed Vitals:   08/16/13 0912  BP: 160/100  Pulse: 74  Height: 5\' 11"  (1.803 m)  Weight: 240 lb (108.863 kg)  SpO2: 97%   Gen: Pleasant, well-nourished, in no distress,  normal affect  ENT: No lesions,  mouth clear,  oropharynx clear, no postnasal drip  Neck: No JVD, no TMG, no carotid bruits  Lungs: No use of accessory muscles, no dullness to percussion, clear without rales or rhonchi  Cardiovascular: RRR, heart sounds normal, no murmur or gallops, no peripheral edema  Musculoskeletal: No deformities, no cyanosis or clubbing  Neuro: alert, non focal  Skin: Warm, no lesions or rashes     Assessment & Plan:  Chronic cough Better - suspect due to omeprazole. Will trial off this, see if cough returns.  Use fluticasone and loratadine this Spring   Pulmonary nodules Stable by CT scan  - repeat in 1 year in this low risk patient.

## 2013-08-16 NOTE — Assessment & Plan Note (Signed)
Stable by CT scan  - repeat in 1 year in this low risk patient.

## 2013-08-16 NOTE — Assessment & Plan Note (Signed)
Better - suspect due to omeprazole. Will trial off this, see if cough returns.  Use fluticasone and loratadine this Spring

## 2013-08-16 NOTE — Patient Instructions (Signed)
Try stopping your omeprazole to see if your miss it. If the cough returns then you should restart  Your Ct scan is stable. We should repeat this scan in 1 year If your nasal drainage worsens this Spring, restart your loratadine nad your fluticasone nasal spray Follow with Dr Lamonte Sakai in 12 months or sooner if you have any problems

## 2014-05-09 ENCOUNTER — Telehealth: Payer: Self-pay | Admitting: Emergency Medicine

## 2014-05-09 MED ORDER — OMEPRAZOLE 40 MG PO CPDR: 40.0000 mg | DELAYED_RELEASE_CAPSULE | Freq: Every day | ORAL | Status: DC

## 2014-05-09 NOTE — Telephone Encounter (Signed)
Rx has been filled. Pt is aware. Nothing further is needed.

## 2014-06-08 ENCOUNTER — Other Ambulatory Visit: Payer: Self-pay | Admitting: *Deleted

## 2014-06-08 MED ORDER — OMEPRAZOLE 40 MG PO CPDR: 40.0000 mg | DELAYED_RELEASE_CAPSULE | Freq: Every day | ORAL | Status: DC

## 2014-07-25 ENCOUNTER — Telehealth: Payer: Self-pay | Admitting: Emergency Medicine

## 2014-07-25 NOTE — Telephone Encounter (Signed)
lmtcb x1 Pt needs OV.

## 2014-07-25 NOTE — Telephone Encounter (Signed)
Pt returned call, to him that he would need an ov before script could be filled, said that he would call back later to make it.Hillery Hunter

## 2014-07-26 NOTE — Telephone Encounter (Signed)
lmtcb for pt.  

## 2014-09-14 ENCOUNTER — Telehealth: Payer: Self-pay | Admitting: Emergency Medicine

## 2014-09-14 DIAGNOSIS — R918 Other nonspecific abnormal finding of lung field: Secondary | ICD-10-CM

## 2014-09-14 NOTE — Telephone Encounter (Signed)
CT Chest w/o contrast scheduled for Tues 10/10/14 at 9:00 at [facility] CT Dept. Called and spoke with patient and he is aware of this appointment date, time and location. Rhonda J Cobb

## 2014-09-14 NOTE — Telephone Encounter (Signed)
Will send to [facility] as FYI. Nothing further needed.

## 2014-09-14 NOTE — Telephone Encounter (Signed)
PCC's please schedule CT before pt's appt on 5/24. Thanks.

## 2014-09-25 ENCOUNTER — Ambulatory Visit (INDEPENDENT_AMBULATORY_CARE_PROVIDER_SITE_OTHER): Payer: Self-pay | Admitting: Family Medicine

## 2014-09-25 DIAGNOSIS — R69 Illness, unspecified: Secondary | ICD-10-CM

## 2014-09-25 NOTE — Progress Notes (Signed)
  NO SHOW    Chronic cough/pulm nodules: -saw pulm, improved with omeprazole -has one more ct scan scheduled in may -denies:  Obesity, BMI 33.49: -diet and exercise: -labs not done in a long time

## 2014-10-10 ENCOUNTER — Ambulatory Visit (INDEPENDENT_AMBULATORY_CARE_PROVIDER_SITE_OTHER)
Admission: RE | Admit: 2014-10-10 | Discharge: 2014-10-10 | Disposition: A | Discharge status: Home / Self Care | Payer: BLUE CROSS/BLUE SHIELD | Source: Ambulatory Visit | Attending: Emergency Medicine | Admitting: Emergency Medicine

## 2014-10-10 ENCOUNTER — Other Ambulatory Visit: Payer: Self-pay

## 2014-10-10 DIAGNOSIS — R918 Other nonspecific abnormal finding of lung field: Secondary | ICD-10-CM | POA: Diagnosis not present

## 2014-10-10 IMAGING — CT CT CHEST W/O CM
2 of 4 series · 15 of 36 positions shown, 18 images · IV contrast (Omnipaque 300)
Comparison: [DATE]

CLINICAL DATA: Followup pulmonary nodules

EXAM:
CT CHEST WITHOUT CONTRAST
TECHNIQUE: Multidetector CT imaging of the chest was performed following the
standard protocol without IV contrast.

[Series 2: chest routine with · axial · 0.84mm/px · z∈[-358,-64]mm · 12 of 71 slices shown, 15 images]
[im 6/71  mediastinal]
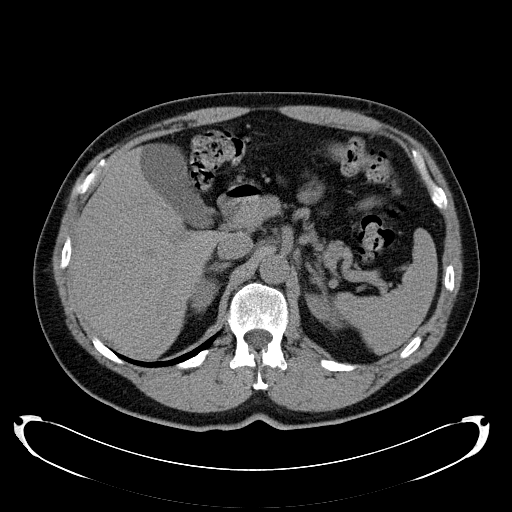
[im 6/71  lung]
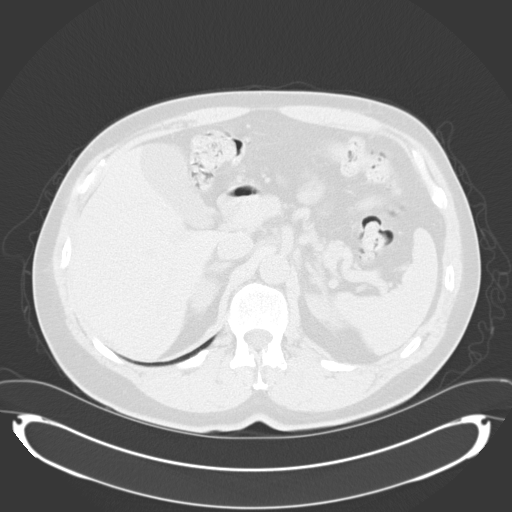
[im 11/71  lung]
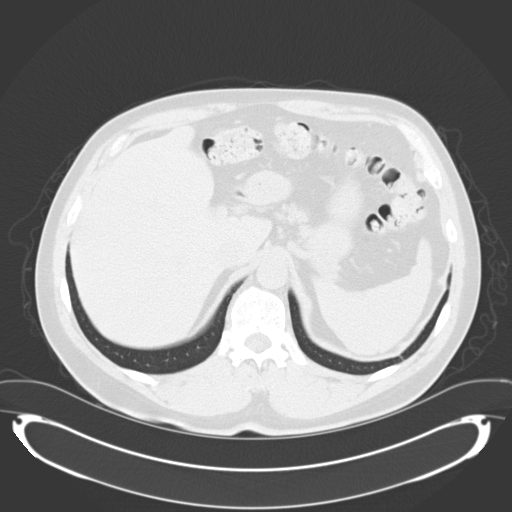
[im 17/71  lung]
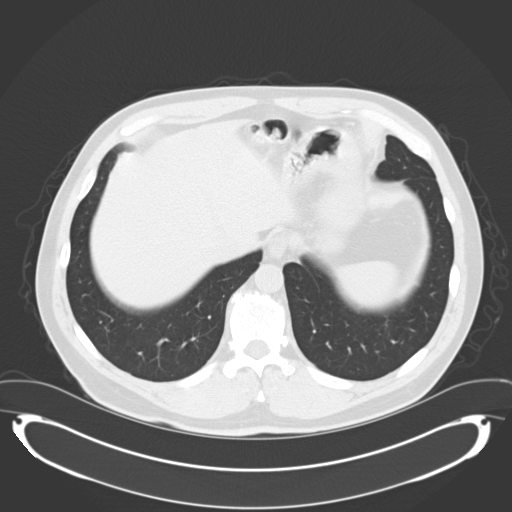
[im 22/71  lung]
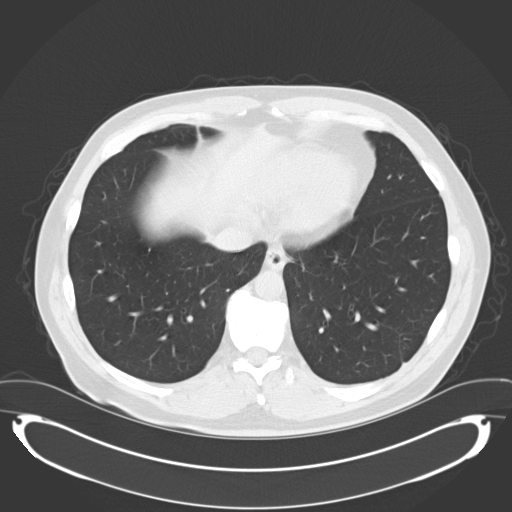
[im 27/71  mediastinal]
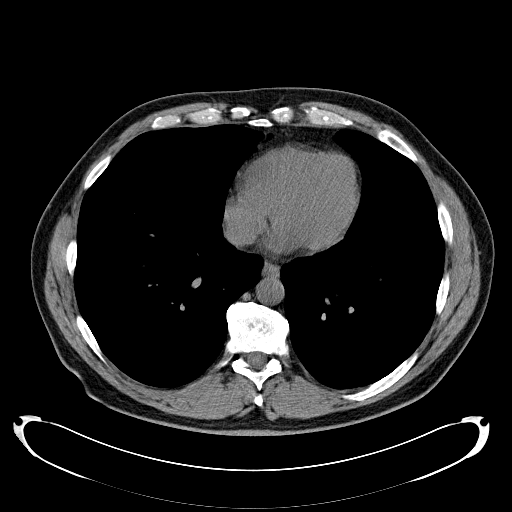
[im 27/71  lung]
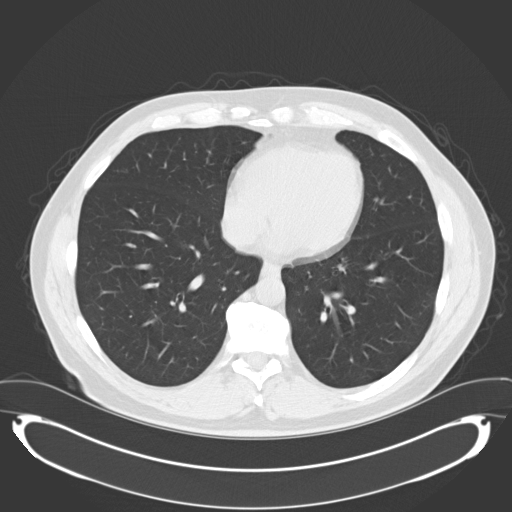
[im 33/71  lung]
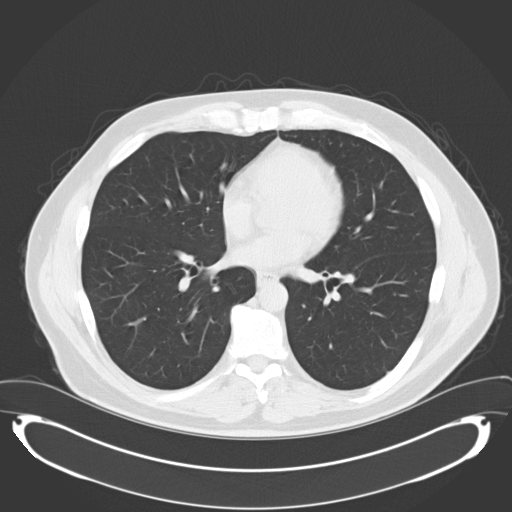
[im 38/71  lung]
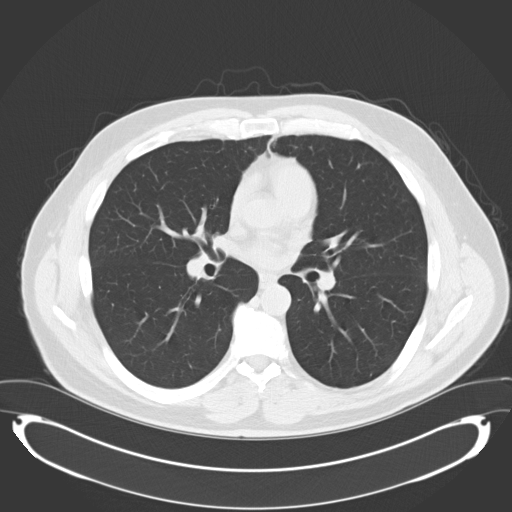
[im 44/71  lung]
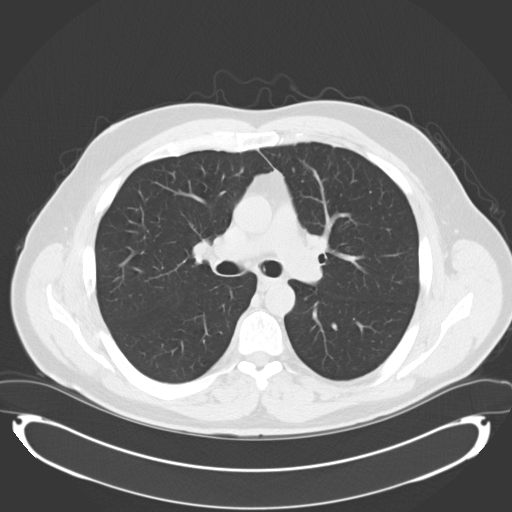
[im 49/71  mediastinal]
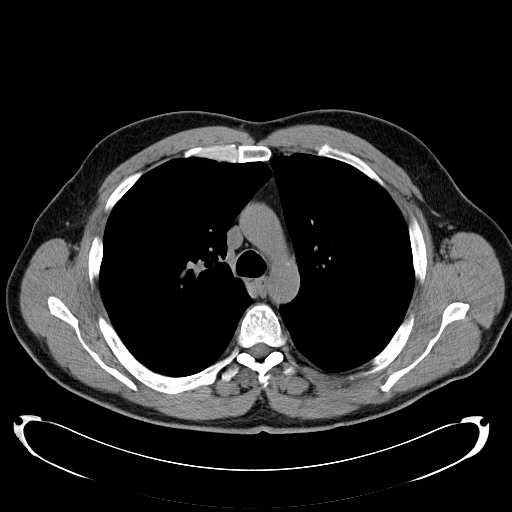
[im 49/71  lung]
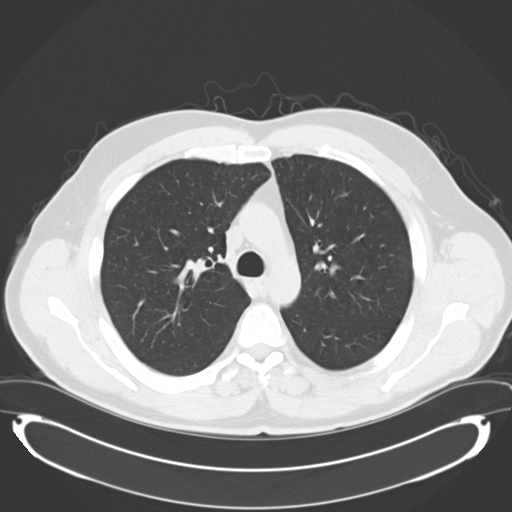
[im 54/71  lung]
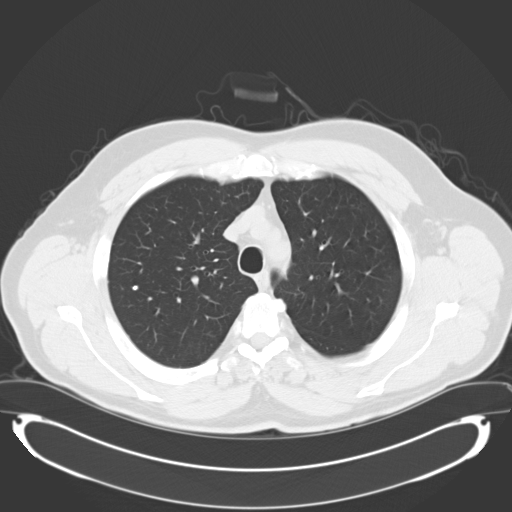
[im 60/71  lung]
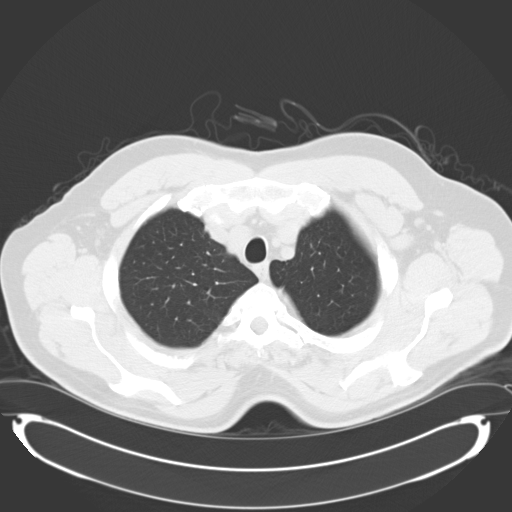
[im 65/71  lung]
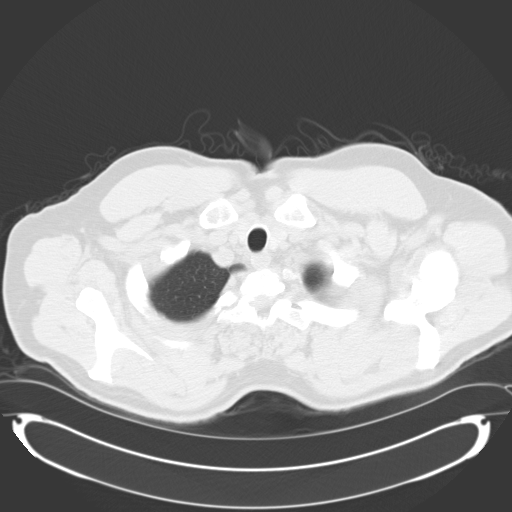

[Series 602: cor · coronal · 0.84mm/px · 3 of 135 slices shown]
[im 27/135  lung]
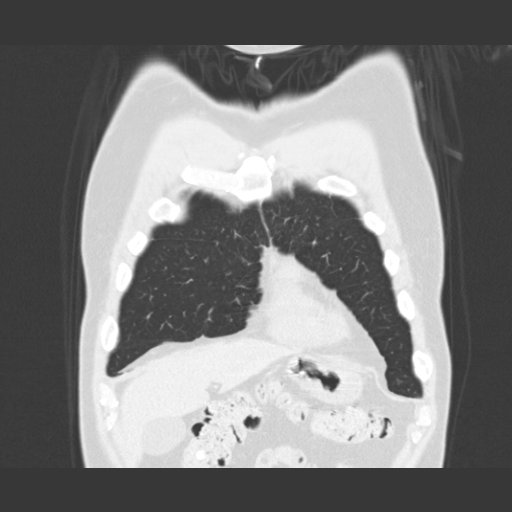
[im 54/135  lung]
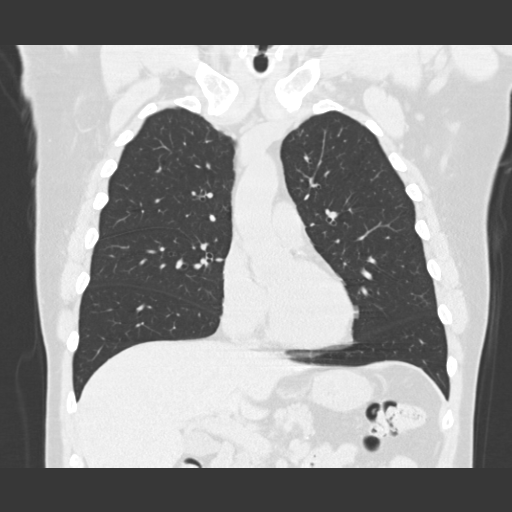
[im 81/135  lung]
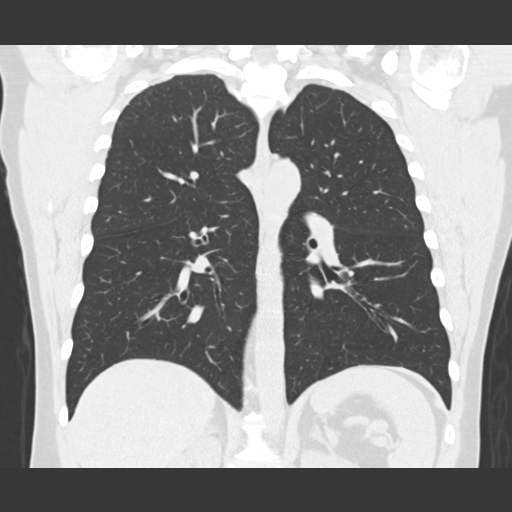

[15 of 36 positions shown; findings below may reference images not displayed]

FINDINGS: Mediastinum: The heart size is normal. There is no pericardial
effusion. There is a calcification within the thoracic aorta as well
as the LAD Coronary artery. The trachea appears patent and is
midline. Normal appearance of the esophagus.

Lungs/Pleura: No pleural effusion. Calcified granulomas are noted in
the right upper lobe and right middle lobe. Tiny nodules within the
right base are unchanged, image 46 series 3 and image 47 of series
3. 3 mm noncalcified nodule in the left lower lobe is unchanged from
previous exam. Stable calcified nodule in the left lower lobe, image
30/series 3. Tiny subpleural nodule in the left lung along the
oblique fissure measuring 2-3 mm no new or enlarging pulmonary
nodule or mass identified.

Upper Abdomen: The visualized portions of the liver appear normal.
Normal appearance of the gallbladder. The spleen is on unremarkable.
The adrenal glands are normal. Stones are noted within the upper
pole of both kidneys.

Musculoskeletal: Mild spondylosis identified within the thoracic
spine. No aggressive lytic or sclerotic bone lesions.
IMPRESSION: 1. Stable small pulmonary nodules. Findings are compatible with a
benign process. No further follow-up indicated.
2. Coronary artery calcification.
3. Renal calculi

## 2014-10-17 ENCOUNTER — Encounter: Payer: Self-pay | Admitting: Emergency Medicine

## 2014-10-17 ENCOUNTER — Ambulatory Visit (INDEPENDENT_AMBULATORY_CARE_PROVIDER_SITE_OTHER): Payer: BLUE CROSS/BLUE SHIELD | Admitting: Emergency Medicine

## 2014-10-17 VITALS — BP 142/82 | HR 72 | Ht 71.0 in | Wt 216.0 lb

## 2014-10-17 DIAGNOSIS — R053 Chronic cough: Secondary | ICD-10-CM

## 2014-10-17 DIAGNOSIS — R05 Cough: Secondary | ICD-10-CM

## 2014-10-17 DIAGNOSIS — R918 Other nonspecific abnormal finding of lung field: Secondary | ICD-10-CM

## 2014-10-17 MED ORDER — BENZONATATE 200 MG PO CAPS: 200.0000 mg | ORAL_CAPSULE | Freq: Three times a day (TID) | ORAL | Status: DC | PRN

## 2014-10-17 MED ORDER — LORATADINE 10 MG PO TABS: 10.0000 mg | ORAL_TABLET | Freq: Every day | ORAL | Status: DC

## 2014-10-17 NOTE — Patient Instructions (Signed)
Please continue your loratadine 10 mg daily Use Tessalon Perles as needed for your cough Your CT scan of the chest is stable. You should not require any further CT scans unless there is some clinical change in the future. Follow with Dr Lamonte Sakai as needed.

## 2014-10-17 NOTE — Assessment & Plan Note (Signed)
He remains on loratadine. He is no longer on a PPI. He uses [employer] as needed. He is happy with his routine. I discussed with him possibly going back to his PPI if his cough worsens. I also recommended that he be diligent about taking his loratadine on a schedule during the fall and spring seasons.

## 2014-10-17 NOTE — Assessment & Plan Note (Signed)
Small micronodular disease with some calcified change most consistent with old granulomatous disease. A CT scan performed 10/10/14 shows no interval change. He does not need any further CAT scans to follow these lesions given that he is a low risk patient

## 2014-10-17 NOTE — Progress Notes (Signed)
Subjective:    Patient ID: Marc Krueger, male    DOB: Dec 10, 1955, 59 y.o.   MRN: [id number]  Cough Associated symptoms include postnasal drip and wheezing. Pertinent negatives include no ear pain, eye redness, fever, headaches, rash, sore throat or shortness of breath.   59 yo man, never smoker, hx allergies, referred by Dr Maudie Mercury for chronic cough. Has been having trouble with cough ever since catching URI about 3 months ago. All sx got better except cough. Has had astelin alternated w fluticasone, has been on loratadine the entire time. He has a hx GERD, so omeprazole was added. He tried albuterol but developed thrush. He has no SOB. Triggers can be cold air, exercise, cold drink. Cough is non-productive. A Ct scan 10/'14 shows a 2cm GGI LLL, some calcified granulomas.   ROV 08/16/13 -- follows up for his cough and also abnormal CT scan chest in 10/14. He has a repeat CT scan from 08/15/13 > scattered calcified granulomas, stable GGI likely scar. He has taken omeprazole daily.    ROV 10/17/14 -- follow-up visit for history of chronic cough and abnormal CT scan of the chest. Last was performed on 08/15/13. Comparison CT now performed on 10/10/14 granulomatous changes in the right upper and right middle lobes;  there are no new nodules or masses identified. I reviewed the CT scan myself and compared with his priors. He is using loratadine, tessalon perles. No longer on PPI.                                                                                                                                                                                                                                                                               Review of Systems  Constitutional: Negative for fever and unexpected weight change.  HENT: Positive for postnasal drip and sinus pressure. Negative for congestion, dental problem, ear pain, nosebleeds, sneezing, sore throat and trouble swallowing.   Eyes: Negative for  redness and itching.  Respiratory: Positive for cough and wheezing. Negative for chest tightness and shortness of breath.   Cardiovascular: Negative for palpitations and leg swelling.  Gastrointestinal: Negative for nausea and vomiting.  Genitourinary: Negative for dysuria.  Musculoskeletal:  Negative for joint swelling.  Skin: Negative for rash.  Neurological: Negative for headaches.  Hematological: Does not bruise/bleed easily.  Psychiatric/Behavioral: Negative for dysphoric mood. The patient is not nervous/anxious.       Objective:   Physical Exam Filed Vitals:   10/17/14 0856  BP: 142/82  Pulse: 72  Height: 5\' 11"  (1.803 m)  Weight: 216 lb (97.977 kg)  SpO2: 96%   Gen: Pleasant, well-nourished, in no distress,  normal affect  ENT: No lesions,  mouth clear,  oropharynx clear, no postnasal drip  Neck: No JVD, no TMG, no carotid bruits  Lungs: No use of accessory muscles, no dullness to percussion, clear without rales or rhonchi  Cardiovascular: RRR, heart sounds normal, no murmur or gallops, no peripheral edema  Musculoskeletal: No deformities, no cyanosis or clubbing  Neuro: alert, non focal  Skin: Warm, no lesions or rashes     Assessment & Plan:  Pulmonary nodules Small micronodular disease with some calcified change most consistent with old granulomatous disease. A CT scan performed 10/10/14 shows no interval change. He does not need any further CAT scans to follow these lesions given that he is a low risk patient   Chronic cough He remains on loratadine. He is no longer on a PPI. He uses [employer] as needed. He is happy with his routine. I discussed with him possibly going back to his PPI if his cough worsens. I also recommended that he be diligent about taking his loratadine on a schedule during the fall and spring seasons.

## 2014-10-31 ENCOUNTER — Encounter: Payer: Self-pay | Admitting: Emergency Medicine

## 2014-10-31 NOTE — Telephone Encounter (Signed)
RB please advise if you would be willing to prescribe this medication for the pt. Thanks.

## 2014-11-08 ENCOUNTER — Ambulatory Visit (INDEPENDENT_AMBULATORY_CARE_PROVIDER_SITE_OTHER): Payer: BLUE CROSS/BLUE SHIELD | Admitting: Family Medicine

## 2014-11-08 ENCOUNTER — Encounter: Payer: Self-pay | Admitting: Family Medicine

## 2014-11-08 VITALS — BP 132/90 | HR 88 | Temp 98.2°F | Ht 71.0 in | Wt 208.6 lb

## 2014-11-08 DIAGNOSIS — F39 Unspecified mood [affective] disorder: Secondary | ICD-10-CM

## 2014-11-08 MED ORDER — PAROXETINE HCL 20 MG PO TABS: 20.0000 mg | ORAL_TABLET | Freq: Every day | ORAL | Status: DC

## 2014-11-08 NOTE — Patient Instructions (Signed)
BEFORE YOU LEAVE: -follow up in 2-4 weeks  Consider cognitive behavioral therapy  Start the Paxil one tablet daily   Follow up sooner if worsening

## 2014-11-08 NOTE — Progress Notes (Signed)
HPI:  GAD/Depression: -hx of this 5 years ago, then better for awhile, but restarted the last 2-3 months -has had new job, financial concerns, move -symptoms: general worry, irritable, depressed mood, upset stomach when anxious Restlessness, on edge: all the time Easily Fatigued: yes Difficulty concentrating: sometimes Irritability: yes Muscle tension: no Sleep Disturbance: mild, sometimes wakes up at 2 or 3 in the morning Interest deficit/anhedonia: yes Guilt (worthlessness, hopelessness, regret): yes Appetite disorder: no - doesn't have time to eat at new job, eats good breakfast and good dinner Psychomotor retardation or agitation: retardation Suicidality/thoughts of self: no   ROS: See pertinent positives and negatives per HPI.  Past Medical History  Diagnosis Date  . Allergy 11-23-1012    seasonal alllergy  . Arthritis     osteoarthritis-knees  . Sinusitis     occ./occ. ringing in ears    Past Surgical History  Procedure Laterality Date  . Foot sugery Bilateral 1980  . Lasik    . Total knee arthroplasty Left 12/07/2012    Procedure: LEFT TOTAL KNEE ARTHROPLASTY;  Surgeon: Mauri Pole, MD;  Location: [facility];  Service: Orthopedics;  Laterality: Left;    Family History  Problem Relation Age of Onset  . Arthritis Mother   . Heart disease Mother   . Lung cancer Sister   . Arthritis Brother   . Diabetes Brother   . Arthritis Brother     History   Social History  . Marital Status: Married    Spouse Name: N/A  . Number of Children: N/A  . Years of Education: N/A   Social History Main Topics  . Smoking status: Never Smoker   . Smokeless tobacco: Not on file  . Alcohol Use: Yes     Comment: 1 per week  . Drug Use: No  . Sexual Activity: Yes   Other Topics Concern  . None   Social History Narrative     Current outpatient prescriptions:  .  albuterol (PROVENTIL HFA;VENTOLIN HFA) 108 (90 BASE) MCG/ACT inhaler, Inhale 2 puffs into the lungs every 6  (six) hours as needed for wheezing or shortness of breath., Disp: 1 Inhaler, Rfl: 0 .  azelastine (ASTELIN) 137 MCG/SPRAY nasal spray, Place 2 sprays into both nostrils 2 (two) times daily. Use in each nostril as directed, Disp: 30 mL, Rfl: 12 .  fluticasone (FLONASE) 50 MCG/ACT nasal spray, Place 2 sprays into both nostrils daily., Disp: 16 g, Rfl: 6 .  loratadine (CLARITIN) 10 MG tablet, Take 1 tablet (10 mg total) by mouth daily., Disp: 30 tablet, Rfl: 5 .  Multiple Vitamin (MULTIVITAMIN) tablet, Take 1 tablet by mouth daily., Disp: , Rfl:  .  PARoxetine (PAXIL) 20 MG tablet, Take 1 tablet (20 mg total) by mouth daily., Disp: 90 tablet, Rfl: 0  EXAM:  Filed Vitals:   11/08/14 0840  BP: 132/90  Pulse: 88  Temp: 98.2 F (36.8 C)    Body mass index is 29.11 kg/(m^2).  GENERAL: vitals reviewed and listed above, alert, oriented, appears well hydrated and in no acute distress  HEENT: atraumatic, conjunttiva clear, no obvious abnormalities on inspection of external nose and ears  NECK: no obvious masses on inspection  LUNGS: clear to auscultation bilaterally, no wheezes, rales or rhonchi, good air movement  CV: HRRR, no peripheral edema  MS: moves all extremities without noticeable abnormality  PSYCH: pleasant and cooperative, no obvious depression or anxiety  ASSESSMENT AND PLAN:  Discussed the following assessment and plan:  Mood disorder -  Plan: DISCONTINUED: PARoxetine (PAXIL) 20 MG tablet  -discussed options for treatment and advised CBT and he would like to start medication -opted for paxil for GAD and mild depression - risks discussed -close follow up -Patient advised to return or notify a doctor immediately if symptoms worsen or persist or new concerns arise.  Patient Instructions  BEFORE YOU LEAVE: -follow up in 2-4 weeks  Consider cognitive behavioral therapy  Start the Paxil one tablet daily   Follow up sooner if worsening     Johan Creveling R.

## 2014-11-08 NOTE — Progress Notes (Signed)
Pre visit review using our clinic review tool, if applicable. No additional management support is needed unless otherwise documented below in the visit note. 

## 2014-11-28 ENCOUNTER — Ambulatory Visit: Payer: BLUE CROSS/BLUE SHIELD | Admitting: Family Medicine

## 2016-04-11 ENCOUNTER — Ambulatory Visit (INDEPENDENT_AMBULATORY_CARE_PROVIDER_SITE_OTHER): Payer: BLUE CROSS/BLUE SHIELD | Admitting: Family Medicine

## 2016-04-11 ENCOUNTER — Encounter: Payer: Self-pay | Admitting: Family Medicine

## 2016-04-11 VITALS — BP 126/84 | HR 73 | Temp 98.3°F | Ht 71.0 in | Wt 237.1 lb

## 2016-04-11 DIAGNOSIS — R21 Rash and other nonspecific skin eruption: Secondary | ICD-10-CM

## 2016-04-11 MED ORDER — TRIAMCINOLONE ACETONIDE 0.025 % EX CREA: 1.0000 "application " | TOPICAL_CREAM | Freq: Two times a day (BID) | CUTANEOUS | 0 refills | Status: DC

## 2016-04-11 MED ORDER — FLUCONAZOLE 150 MG PO TABS
150.0000 mg | ORAL_TABLET | Freq: Once | ORAL | 0 refills | Status: AC
Start: 2016-04-11 — End: 2016-04-11

## 2016-04-11 NOTE — Patient Instructions (Addendum)
BEFORE YOU LEAVE: -follow up: 2-3 weeks  Take the Diflucan once daily for 3 days.  Use the cream 1-2 times daily to affected area for 1-2 weeks.  Also use over the counter lotrimin or other antifungal cream twice daily for 4 weeks  Use hypoallergenic soap and detergent.

## 2016-04-11 NOTE — Progress Notes (Signed)
Pre visit review using our clinic review tool, if applicable. No additional management support is needed unless otherwise documented below in the visit note. 

## 2016-04-11 NOTE — Progress Notes (Signed)
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  Past Medical History:  Diagnosis Date  . Allergy 11-23-1012   seasonal alllergy  . Arthritis    osteoarthritis-knees  . Sinusitis    occ./occ. ringing in ears    Past Surgical History:  Procedure Laterality Date  . foot sugery Bilateral 1980  . LASIK    . TOTAL KNEE ARTHROPLASTY Left 12/07/2012   Procedure: LEFT TOTAL KNEE ARTHROPLASTY;  Surgeon: Mauri Pole, MD;  Location: [facility];  Service: Orthopedics;  Laterality: Left;    Family History  Problem Relation Age of Onset  . Arthritis Mother   . Heart disease Mother   . Lung cancer Sister   . Arthritis Brother   . Diabetes Brother   . Arthritis Brother     Social History   Social History  . Marital status: Married    Spouse name: N/A  . Number of children: N/A  . Years of education: N/A   Social History Main Topics  . Smoking status: Never Smoker  . Smokeless tobacco: None  . Alcohol use Yes     Comment: 1 per week  . Drug use: No  . Sexual activity: Yes   Other Topics Concern  . None   Social History Narrative  . None     Current Outpatient Prescriptions:  .  azelastine (ASTELIN) 137 MCG/SPRAY nasal spray, Place 2 sprays into both nostrils 2 (two) times daily. Use in each nostril as directed, Disp: 30 mL, Rfl: 12 .  fluticasone (FLONASE) 50 MCG/ACT nasal spray, Place 2 sprays into both nostrils daily., Disp: 16 g, Rfl: 6 .  loratadine (CLARITIN) 10 MG tablet, Take 1 tablet (10 mg total) by mouth daily., Disp: 30 tablet, Rfl: 5 .  Multiple Vitamin (MULTIVITAMIN) tablet, Take 1 tablet by mouth daily., Disp: , Rfl:  .  fluconazole (DIFLUCAN) 150 MG tablet, Take 1 tablet (150 mg total) by mouth once., Disp: 3 tablet, Rfl: 0 .  triamcinolone (KENALOG) 0.025 %  cream, Apply 1 application topically 2 (two) times daily., Disp: 80 g, Rfl: 0  EXAM:  Vitals:   04/11/16 0803  BP: 126/84  Pulse: 73  Temp: 98.3 F (36.8 C)    Body mass index is 33.07 kg/m.  GENERAL: vitals reviewed and listed above, alert, oriented, appears well hydrated and in no acute distress  HEENT: atraumatic, conjunttiva clear, no obvious abnormalities on inspection of external nose and ears  NECK: no obvious masses on inspection  SKIN: finely scaly patches of erythema on in the gluteal cleft, both buttocks and the upper post thighs  MS: moves all extremities without noticeable abnormality  PSYCH: pleasant and cooperative, no obvious depression or anxiety  ASSESSMENT AND PLAN:  Discussed the following assessment and plan:  Rash and nonspecific skin eruption  -we discussed possible serious and likely etiologies, workup and treatment, treatment risks and return precautions - query yeast or fungal dermatitis -after this discussion, Thuy opted for tx per order and instructions -follow up advised 2-3 weeks -of course, we advised Tyge  to return or notify a doctor immediately if symptoms worsen or persist or new concerns arise.   Patient Instructions  BEFORE YOU LEAVE: -follow up: 2-3 weeks  Take the Diflucan once daily for 3 days.  Use the cream 1-2 times daily  to affected area for 1-2 weeks.  Also use over the counter lotrimin or other antifungal cream twice daily for 4 weeks  Use hypoallergenic soap and detergent.   Colin Benton R., DO

## 2016-04-25 ENCOUNTER — Ambulatory Visit: Payer: Self-pay | Admitting: Osteopathic Medicine

## 2016-04-26 NOTE — Progress Notes (Deleted)
  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 Treated two doses of diflucan with topical antifungal.  ROS: See pertinent positives and negatives per HPI.  Past Medical History:  Diagnosis Date  . Allergy 11-23-1012   seasonal alllergy  . Arthritis    osteoarthritis-knees  . Sinusitis    occ./occ. ringing in ears    Past Surgical History:  Procedure Laterality Date  . foot sugery Bilateral 1980  . LASIK    . TOTAL KNEE ARTHROPLASTY Left 12/07/2012   Procedure: LEFT TOTAL KNEE ARTHROPLASTY;  Surgeon: Mauri Pole, MD;  Location: [facility];  Service: Orthopedics;  Laterality: Left;    Family History  Problem Relation Age of Onset  . Arthritis Mother   . Heart disease Mother   . Lung cancer Sister   . Arthritis Brother   . Diabetes Brother   . Arthritis Brother     Social History   Social History  . Marital status: Married    Spouse name: N/A  . Number of children: N/A  . Years of education: N/A   Social History Main Topics  . Smoking status: Never Smoker  . Smokeless tobacco: Not on file  . Alcohol use Yes     Comment: 1 per week  . Drug use: No  . Sexual activity: Yes   Other Topics Concern  . Not on file   Social History Narrative  . No narrative on file     Current Outpatient Prescriptions:  .  azelastine (ASTELIN) 137 MCG/SPRAY nasal spray, Place 2 sprays into both nostrils 2 (two) times daily. Use in each nostril as directed, Disp: 30 mL, Rfl: 12 .  fluticasone (FLONASE) 50 MCG/ACT nasal spray, Place 2 sprays into both nostrils daily., Disp: 16 g, Rfl: 6 .  loratadine (CLARITIN) 10 MG tablet, Take 1 tablet (10 mg total) by mouth daily., Disp: 30 tablet, Rfl: 5 .  Multiple Vitamin (MULTIVITAMIN) tablet, Take 1 tablet by mouth daily., Disp: , Rfl:  .  triamcinolone (KENALOG) 0.025 % cream, Apply 1 application topically 2 (two) times daily., Disp: 80 g, Rfl: 0  EXAM:  There were no vitals filed for this  visit.  There is no height or weight on file to calculate BMI.  GENERAL: vitals reviewed and listed above, alert, oriented, appears well hydrated and in no acute distress  HEENT: atraumatic, conjunttiva clear, no obvious abnormalities on inspection of external nose and ears  NECK: no obvious masses on inspection  LUNGS: clear to auscultation bilaterally, no wheezes, rales or rhonchi, good air movement  CV: HRRR, no peripheral edema  MS: moves all extremities without noticeable abnormality  PSYCH: pleasant and cooperative, no obvious depression or anxiety  ASSESSMENT AND PLAN:  Discussed the following assessment and plan:  No diagnosis found.  -Patient advised to return or notify a doctor immediately if symptoms worsen or persist or new concerns arise.  There are no Patient Instructions on file for this visit.  Colin Benton R., DO

## 2016-04-28 ENCOUNTER — Ambulatory Visit: Payer: Self-pay | Admitting: Family Medicine

## 2016-08-03 ENCOUNTER — Other Ambulatory Visit: Payer: Self-pay | Admitting: Family Medicine

## 2016-08-04 ENCOUNTER — Other Ambulatory Visit: Payer: Self-pay | Admitting: Family Medicine

## 2016-08-04 NOTE — Telephone Encounter (Signed)
Pt states his rash is back and needs a refill of triamcinolone (KENALOG) 0.025 % cream  fluconazole (DIFLUCAN) 150 MG tablet   [facility] [address] [place] - [address]

## 2016-08-05 ENCOUNTER — Other Ambulatory Visit: Payer: Self-pay | Admitting: Family Medicine

## 2016-08-05 MED ORDER — TRIAMCINOLONE ACETONIDE 0.025 % EX CREA: 1.0000 "application " | TOPICAL_CREAM | Freq: Two times a day (BID) | CUTANEOUS | 0 refills | Status: DC

## 2016-08-05 NOTE — Telephone Encounter (Signed)
Did you get a prior note form me on this? Ok to refill once, but please let pt know to follow up if recurrent or persistent rash. Thanks.

## 2016-08-05 NOTE — Telephone Encounter (Signed)
Rx done. 

## 2016-08-05 NOTE — Telephone Encounter (Signed)
Rx sent for Triamcinolone cream.

## 2016-10-10 ENCOUNTER — Encounter: Payer: Self-pay | Admitting: Family Medicine

## 2017-02-12 ENCOUNTER — Encounter: Payer: Self-pay | Admitting: Family Medicine

## 2017-06-04 ENCOUNTER — Encounter: Payer: Self-pay | Admitting: Family Medicine

## 2018-10-07 ENCOUNTER — Encounter: Payer: Self-pay | Admitting: Emergency Medicine

## 2018-10-07 ENCOUNTER — Emergency Department
Admission: EM | Admit: 2018-10-07 | Discharge: 2018-10-07 | Disposition: A | Discharge status: Home / Self Care | Payer: BLUE CROSS/BLUE SHIELD | Source: Home / Self Care | Attending: Family Medicine | Admitting: Family Medicine

## 2018-10-07 ENCOUNTER — Other Ambulatory Visit: Payer: Self-pay

## 2018-10-07 DIAGNOSIS — B9789 Other viral agents as the cause of diseases classified elsewhere: Secondary | ICD-10-CM

## 2018-10-07 DIAGNOSIS — J069 Acute upper respiratory infection, unspecified: Secondary | ICD-10-CM

## 2018-10-07 DIAGNOSIS — H8112 Benign paroxysmal vertigo, left ear: Secondary | ICD-10-CM

## 2018-10-07 MED ORDER — MECLIZINE HCL 25 MG PO TABS: 25.0000 mg | ORAL_TABLET | Freq: Three times a day (TID) | ORAL | 1 refills | Status: DC | PRN

## 2018-10-07 NOTE — ED Triage Notes (Signed)
Vertigo started this am, he had this 7-8 years ago. He reports to have had a cold about 3 weeks ago noticed about 1 week ago hi LT ear was tender, today dizzy.

## 2018-10-07 NOTE — Discharge Instructions (Addendum)
Take plain guaifenesin (1200mg  extended release tabs such as Mucinex) twice daily, with plenty of water, for cough and congestion.  May add Pseudoephedrine (30mg , one or two every 4 to 6 hours) for sinus congestion.  Get adequate rest.   May take Delsym Cough Suppressant at bedtime for nighttime cough.  Stop all antihistamines for now, and other non-prescription cough/cold preparations.

## 2018-10-07 NOTE — ED Provider Notes (Signed)
[name]    CSN: [id number] Arrival date & time: 10/07/18  1216     History   Chief Complaint Chief Complaint  Patient presents with  . Dizziness    HPI Marc Krueger is a 63 y.o. male.   Patient complains of onset of vertigo this morning with head movement.  He has observed that the dizziness only occurs when he tilts his head to the left.  He states that he had a cold 3 weeks ago that resolved, although he has had some lingering discomfort in his left ear.  The history is provided by the patient.    Past Medical History:  Diagnosis Date  . Allergy 11-23-1012   seasonal alllergy  . Arthritis    osteoarthritis-knees  . Sinusitis    occ./occ. ringing in ears    Patient Active Problem List   Diagnosis Date Noted  . Pulmonary nodules 08/16/2013  . Chronic cough 05/10/2013  . Expected blood loss anemia 12/08/2012  . Obese 12/08/2012  . S/P left TKA 12/07/2012  . Obesity (BMI 30.0-34.9) 03/19/2012  . Post-nasal drip 03/19/2012  . Osteoarthritis of both knees 03/19/2012    Past Surgical History:  Procedure Laterality Date  . foot sugery Bilateral 1980  . JOINT REPLACEMENT    . LASIK    . TOTAL KNEE ARTHROPLASTY Left 12/07/2012   Procedure: LEFT TOTAL KNEE ARTHROPLASTY;  Surgeon: Mauri Pole, MD;  Location: [facility];  Service: Orthopedics;  Laterality: Left;       Home Medications    Prior to Admission medications   Medication Sig Start Date End Date Taking? Authorizing Provider  meclizine (ANTIVERT) 25 MG tablet Take 1 tablet (25 mg total) by mouth 3 (three) times daily as needed for dizziness. 10/07/18   Kandra Nicolas, MD    Family History Family History  Problem Relation Age of Onset  . Arthritis Mother   . Heart disease Mother   . Diabetes Mother   . Lung cancer Sister   . Arthritis Brother   . Diabetes Brother   . Arthritis Brother     Social History Social History   Tobacco Use  . Smoking status: Never Smoker  . Smokeless  tobacco: Never Used  Substance Use Topics  . Alcohol use: Yes    Comment: 1 per week  . Drug use: No     Allergies   Patient has no known allergies.   Review of Systems Review of Systems + sore throat, resolved + cough, resolved No pleuritic pain No wheezing + nasal congestion + post-nasal drainage No sinus pain/pressure No itchy/red eyes ? left earache + dizziness No hemoptysis No SOB No fever/chills No nausea No vomiting No abdominal pain No diarrhea No urinary symptoms No skin rash No fatigue No myalgias No headache    Physical Exam Triage Vital Signs ED Triage Vitals [10/07/18 1309]  Enc Vitals Group     BP (!) 144/99     Pulse Rate 67     Resp      Temp 98.5 F (36.9 C)     Temp Source Oral     SpO2 97 %     Weight 230 lb (104.3 kg)     Height 5\' 11"  (1.803 m)     Head Circumference      Peak Flow      Pain Score 0     Pain Loc      Pain Edu?      Excl. in [place]?  No data found.  Updated Vital Signs BP (!) 144/99 (BP Location: Right Arm)   Pulse 67   Temp 98.5 F (36.9 C) (Oral)   Ht 5\' 11"  (1.803 m)   Wt 104.3 kg   SpO2 97%   BMI 32.08 kg/m   Visual Acuity Right Eye Distance:   Left Eye Distance:   Bilateral Distance:    Right Eye Near:   Left Eye Near:    Bilateral Near:     Physical Exam Nursing notes and Vital Signs reviewed. Appearance:  Patient appears stated age, and in no acute distress Eyes:  Pupils are equal, round, and reactive to light and accomodation.  Extraocular movement is intact.  Conjunctivae are not inflamed.  Fundi benign.  No nystagmus.  Ears:  Canals normal.  Tympanic membranes normal.  Nose:  Mildly congested turbinates.  No sinus tenderness  Pharynx:  Normal Neck:  Supple. No adenopathy. Lungs:  Clear to auscultation.  Breath sounds are equal.  Moving air well. Heart:  Regular rate and rhythm without murmurs, rubs, or gallops.  Abdomen:  Nontender without masses or hepatosplenomegaly.  Bowel sounds  are present.  No CVA or flank tenderness.  Extremities:  No edema.  Skin:  No rash present.    UC Treatments / Results  Labs (all labs ordered are listed, but only abnormal results are displayed) Labs Reviewed -   Tympanometry:  Right ear tympanogram normal; Left ear tympanogram normal  EKG None  Radiology No results found.  Procedures Procedures (including critical care time)  Medications Ordered in UC Medications - No data to display  Initial Impression / Assessment and Plan / UC Course  I have reviewed the triage vital signs and the nursing notes.  Pertinent labs & imaging results that were available during my care of the patient were reviewed by me and considered in my medical decision making (see chart for details).    There is no evidence of bacterial infection today.   Begin Antivert. Followup with ENT if not improving.   Final Clinical Impressions(s) / UC Diagnoses   Final diagnoses:  Viral URI with cough  Benign paroxysmal positional vertigo of left ear     Discharge Instructions     Take plain guaifenesin (1200mg  extended release tabs such as Mucinex) twice daily, with plenty of water, for cough and congestion.  May add Pseudoephedrine (30mg , one or two every 4 to 6 hours) for sinus congestion.  Get adequate rest.   May take Delsym Cough Suppressant at bedtime for nighttime cough.  Stop all antihistamines for now, and other non-prescription cough/cold preparations.      ED Prescriptions    Medication Sig Dispense Auth. Provider   meclizine (ANTIVERT) 25 MG tablet Take 1 tablet (25 mg total) by mouth 3 (three) times daily as needed for dizziness. 15 tablet Kandra Nicolas, MD         Kandra Nicolas, MD 10/11/18 1249

## 2018-11-26 ENCOUNTER — Other Ambulatory Visit: Payer: Self-pay

## 2018-11-26 ENCOUNTER — Emergency Department (INDEPENDENT_AMBULATORY_CARE_PROVIDER_SITE_OTHER): Payer: Self-pay

## 2018-11-26 ENCOUNTER — Emergency Department
Admission: EM | Admit: 2018-11-26 | Discharge: 2018-11-26 | Disposition: A | Discharge status: Home / Self Care | Payer: Self-pay | Source: Home / Self Care | Attending: Family Medicine | Admitting: Family Medicine

## 2018-11-26 DIAGNOSIS — J209 Acute bronchitis, unspecified: Secondary | ICD-10-CM

## 2018-11-26 DIAGNOSIS — R05 Cough: Secondary | ICD-10-CM

## 2018-11-26 LAB — POCT CBC W AUTO DIFF (K'VILLE URGENT CARE)

## 2018-11-26 IMAGING — DX CHEST - 2 VIEW
2 series · 2 of 2 positions shown · non-contrast
Comparison: Chest CT [DATE]; chest radiograph [DATE]

CLINICAL DATA: Nonproductive cough.  Night sweats.

EXAM:
CHEST - 2 VIEW

[chest pa]
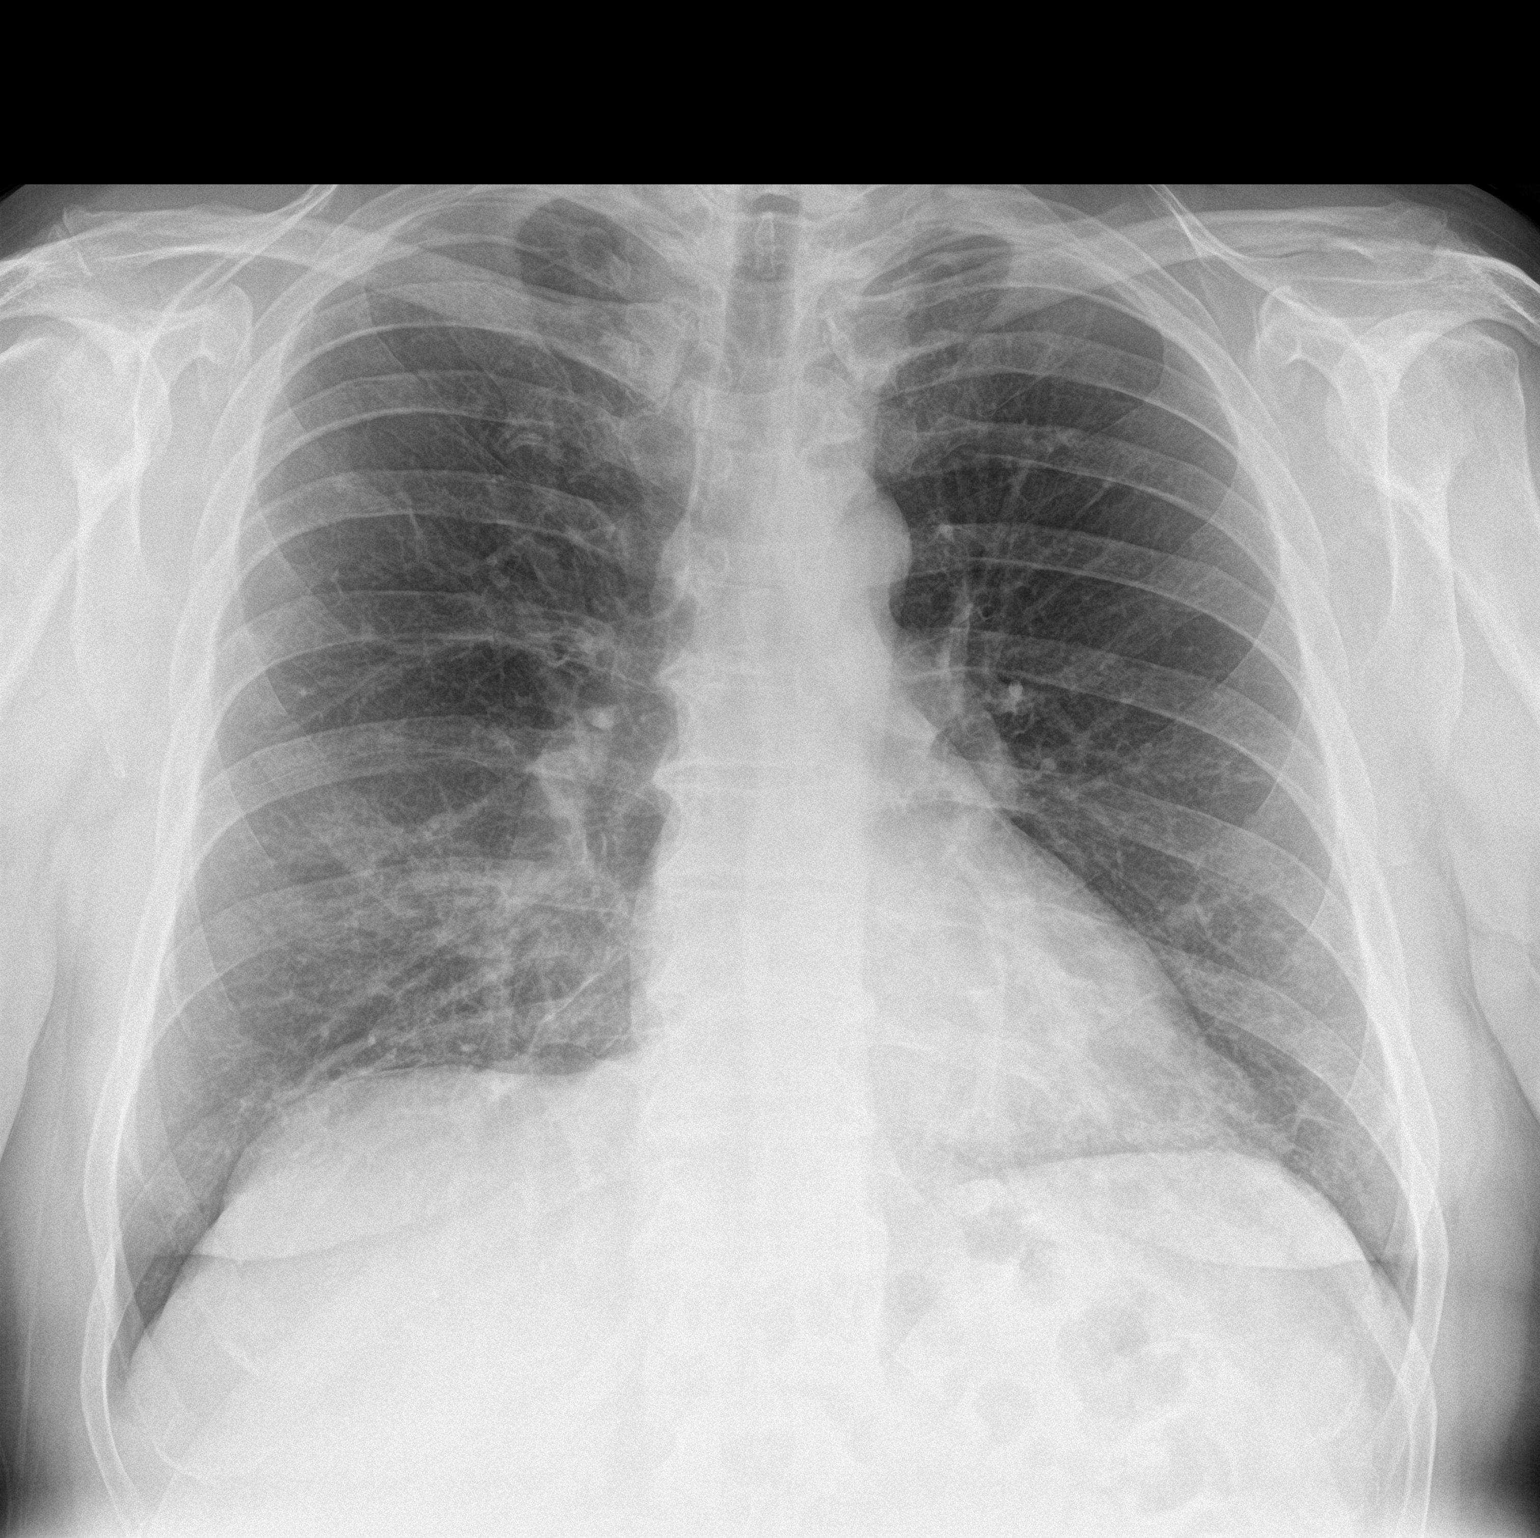

[chest lat]
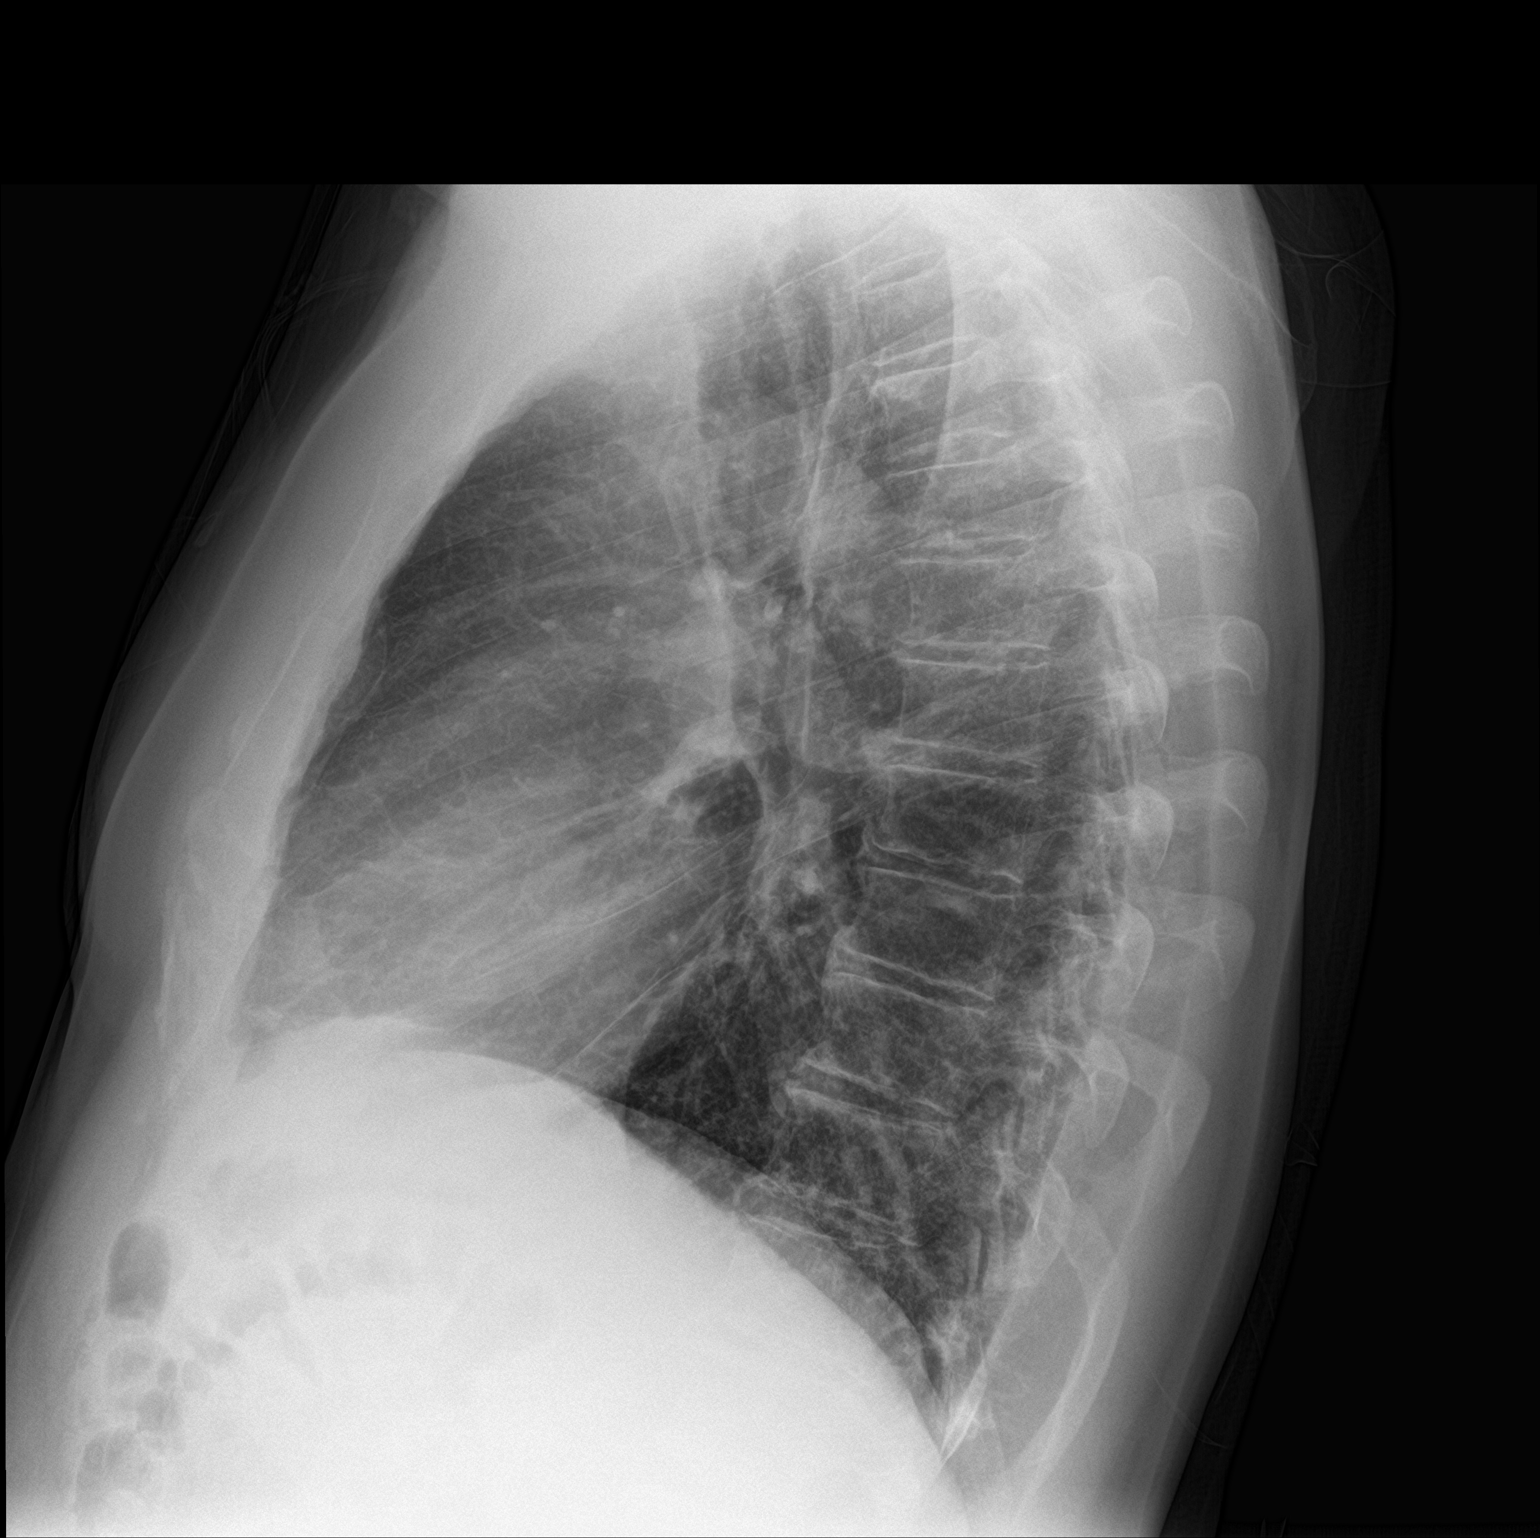

[2 of 2 positions shown; findings below may reference images not displayed]

FINDINGS: Stable cardiac and mediastinal contours. No large area pulmonary
consolidation. No pleural effusion or pneumothorax. Thoracic spine
degenerative changes. Similar-appearing tiny bilateral pulmonary
nodules.
IMPRESSION: No acute cardiopulmonary process.

## 2018-11-26 MED ORDER — BENZONATATE 200 MG PO CAPS: ORAL_CAPSULE | ORAL | 0 refills | Status: DC

## 2018-11-26 MED ORDER — AZITHROMYCIN 250 MG PO TABS: ORAL_TABLET | ORAL | 0 refills | Status: DC

## 2018-11-26 NOTE — ED Triage Notes (Signed)
Dry cough started over a month ago, got better now worse the last 2 weeks.

## 2018-11-26 NOTE — Discharge Instructions (Addendum)
Take plain guaifenesin (1200mg  extended release tabs such as Mucinex) twice daily, with plenty of water, for cough and congestion.  Get adequate rest.   Stop all antihistamines for now, and other non-prescription cough/cold preparations.

## 2018-11-26 NOTE — ED Provider Notes (Signed)
[facility]    CSN: [id number] Arrival date & time: 11/26/18  1210     History   Chief Complaint Chief Complaint  Patient presents with  . Cough    HPI [name] is a 63 y.o. male.   Patient complains of a persistent cough since May.  During the past two weeks his cough has become worse and he has been increasingly fatigued, developing sweats at night.  He had a negative COVID19 test on 11/20/18 through a [facility].  He denies pleuritic pain, shortness of breath, or changes in taste/smell. He believes that he has lost about 10 pounds during the past 2 weeks.  The history is provided by the patient.    Past Medical History:  Diagnosis Date  . Allergy 11-23-1012   seasonal alllergy  . Arthritis    osteoarthritis-knees  . Sinusitis    occ./occ. ringing in ears    Patient Active Problem List   Diagnosis Date Noted  . Pulmonary nodules 08/16/2013  . Chronic cough 05/10/2013  . Expected blood loss anemia 12/08/2012  . Obese 12/08/2012  . S/P left TKA 12/07/2012  . Obesity (BMI 30.0-34.9) 03/19/2012  . Post-nasal drip 03/19/2012  . Osteoarthritis of both knees 03/19/2012    Past Surgical History:  Procedure Laterality Date  . foot sugery Bilateral 1980  . JOINT REPLACEMENT    . LASIK    . TOTAL KNEE ARTHROPLASTY Left 12/07/2012   Procedure: LEFT TOTAL KNEE ARTHROPLASTY;  Surgeon: Mauri Pole, MD;  Location: [facility];  Service: Orthopedics;  Laterality: Left;       Home Medications    Prior to Admission medications   Medication Sig Start Date End Date Taking? Authorizing Provider  azithromycin (ZITHROMAX Z-PAK) 250 MG tablet Take 2 tabs today; then begin one tab once daily for 4 more days. 11/26/18   Kandra Nicolas, MD  benzonatate (TESSALON) 200 MG capsule Take one cap by mouth at bedtime as needed for cough.  May repeat in 4 to 6 hours 11/26/18   Kandra Nicolas, MD  meclizine (ANTIVERT) 25 MG tablet Take 1 tablet (25 mg total) by mouth 3  (three) times daily as needed for dizziness. 10/07/18   Kandra Nicolas, MD    Family History Family History  Problem Relation Age of Onset  . Arthritis Mother   . Heart disease Mother   . Diabetes Mother   . Lung cancer Sister   . Arthritis Brother   . Diabetes Brother   . Arthritis Brother     Social History Social History   Tobacco Use  . Smoking status: Never Smoker  . Smokeless tobacco: Never Used  Substance Use Topics  . Alcohol use: Yes    Comment: 1 per week  . Drug use: No     Allergies   Patient has no known allergies.   Review of Systems Review of Systems No sore throat + cough No pleuritic pain No wheezing Minimal nasal congestion ? post-nasal drainage No sinus pain/pressure No itchy/red eyes No earache No hemoptysis No SOB No fever, + chills/sweats No nausea No vomiting No abdominal pain No diarrhea No urinary symptoms No skin rash + fatigue No myalgias No headache Used OTC meds without relief (Nyquil, etc)  Physical Exam Triage Vital Signs ED Triage Vitals  Enc Vitals Group     BP 11/26/18 1244 125/80     Pulse Rate 11/26/18 1244 88     Resp --  Temp 11/26/18 1244 98.4 F (36.9 C)     Temp Source 11/26/18 1244 Oral     SpO2 11/26/18 1244 96 %     Weight 11/26/18 1246 220 lb (99.8 kg)     Height 11/26/18 1246 5\' 10"  (1.778 m)     Head Circumference --      Peak Flow --      Pain Score 11/26/18 1245 0     Pain Loc --      Pain Edu? --      Excl. in [place]? --    No data found.  Updated Vital Signs BP 125/80 (BP Location: Right Arm)   Pulse 88   Temp 98.4 F (36.9 C) (Oral)   Ht 5\' 10"  (1.778 m)   Wt 99.8 kg   SpO2 96%   BMI 31.57 kg/m   Visual Acuity Right Eye Distance:   Left Eye Distance:   Bilateral Distance:    Right Eye Near:   Left Eye Near:    Bilateral Near:     Physical Exam Nursing notes and Vital Signs reviewed. Appearance:  Patient appears stated age, and in no acute distress Eyes:  Pupils  are equal, round, and reactive to light and accomodation.  Extraocular movement is intact.  Conjunctivae are not inflamed  Ears:  Canals normal.  Tympanic membranes normal.  Nose:  Mildly congested turbinates.  No sinus tenderness.  Pharynx:  Normal Neck:  Supple. No adenopathy. Lungs:  Clear to auscultation.  Breath sounds are equal.  Moving air well. Heart:  Regular rate and rhythm without murmurs, rubs, or gallops. However, an occasional ectopic beat is heard.  Abdomen:  Nontender without masses or hepatosplenomegaly.  Bowel sounds are present.  No CVA or flank tenderness.  Extremities:  No edema.  Skin:  No rash present.    UC Treatments / Results  Labs (all labs ordered are listed, but only abnormal results are displayed) Labs Reviewed  POCT CBC W AUTO DIFF ([place]):  WBC 11.8; LY 10.9; MO 6.5; GR 82.6; Hgb 14.5; Platelets 547     EKG  Rate:  90 BPM PR:  146 msec QT:  386 msec QTcH:  472 msec QRSD:  86 msec QRS axis:  19 degrees Interpretation:  Sinus rhythm with occasional PVC and PAC, otherwise within normal limits   Radiology Dg Chest 2 View  Result Date: 11/26/2018 CLINICAL DATA:  Nonproductive cough.  Night sweats. EXAM: CHEST - 2 VIEW COMPARISON:  Chest CT 10/10/2014; chest radiograph 03/11/2013 FINDINGS: Stable cardiac and mediastinal contours. No large area pulmonary consolidation. No pleural effusion or pneumothorax. Thoracic spine degenerative changes. Similar-appearing tiny bilateral pulmonary nodules. IMPRESSION: No acute cardiopulmonary process. Electronically Signed   By: Lovey Newcomer M.D.   On: 11/26/2018 14:44    Procedures Procedures (including critical care time)  Medications Ordered in UC Medications - No data to display  Initial Impression / Assessment and Plan / UC Course  I have reviewed the triage vital signs and the nursing notes.  Pertinent labs & imaging results that were available during my care of the patient were reviewed by me  and considered in my medical decision making (see chart for details).    Negative chest X-ray reassuring. Begin Z-pack. Prescription written for Benzonatate [facility]) to take at bedtime for night-time cough.  Followup with PCP in about 10 days to repeat CBC and follow-up thrombocytosisl   Final Clinical Impressions(s) / UC Diagnoses   Final diagnoses:  Acute bronchitis,  unspecified organism     Discharge Instructions     Take plain guaifenesin (1200mg  extended release tabs such as Mucinex) twice daily, with plenty of water, for cough and congestion.  Get adequate rest.   Stop all antihistamines for now, and other non-prescription cough/cold preparations.       ED Prescriptions    Medication Sig Dispense Auth. Provider   azithromycin (ZITHROMAX Z-PAK) 250 MG tablet Take 2 tabs today; then begin one tab once daily for 4 more days. 6 tablet Kandra Nicolas, MD   benzonatate (TESSALON) 200 MG capsule Take one cap by mouth at bedtime as needed for cough.  May repeat in 4 to 6 hours 15 capsule Assunta Found Ishmael Holter, MD        Kandra Nicolas, MD 11/29/18 [phone]

## 2021-02-22 ENCOUNTER — Other Ambulatory Visit: Payer: Self-pay

## 2021-02-22 ENCOUNTER — Encounter: Payer: Self-pay | Admitting: Emergency Medicine

## 2021-02-22 ENCOUNTER — Emergency Department (INDEPENDENT_AMBULATORY_CARE_PROVIDER_SITE_OTHER): Payer: Self-pay

## 2021-02-22 ENCOUNTER — Emergency Department
Admission: EM | Admit: 2021-02-22 | Discharge: 2021-02-22 | Disposition: A | Discharge status: Home / Self Care | Payer: Self-pay | Source: Home / Self Care

## 2021-02-22 DIAGNOSIS — M7989 Other specified soft tissue disorders: Secondary | ICD-10-CM

## 2021-02-22 DIAGNOSIS — M79672 Pain in left foot: Secondary | ICD-10-CM

## 2021-02-22 IMAGING — DX DG FOOT COMPLETE 3+V*L*
3 series · 3 of 3 positions shown · non-contrast
Comparison: None.

CLINICAL DATA: Left foot pain and swelling, no known injury, gout

EXAM:
LEFT FOOT - COMPLETE 3+ VIEW

[foot ap]
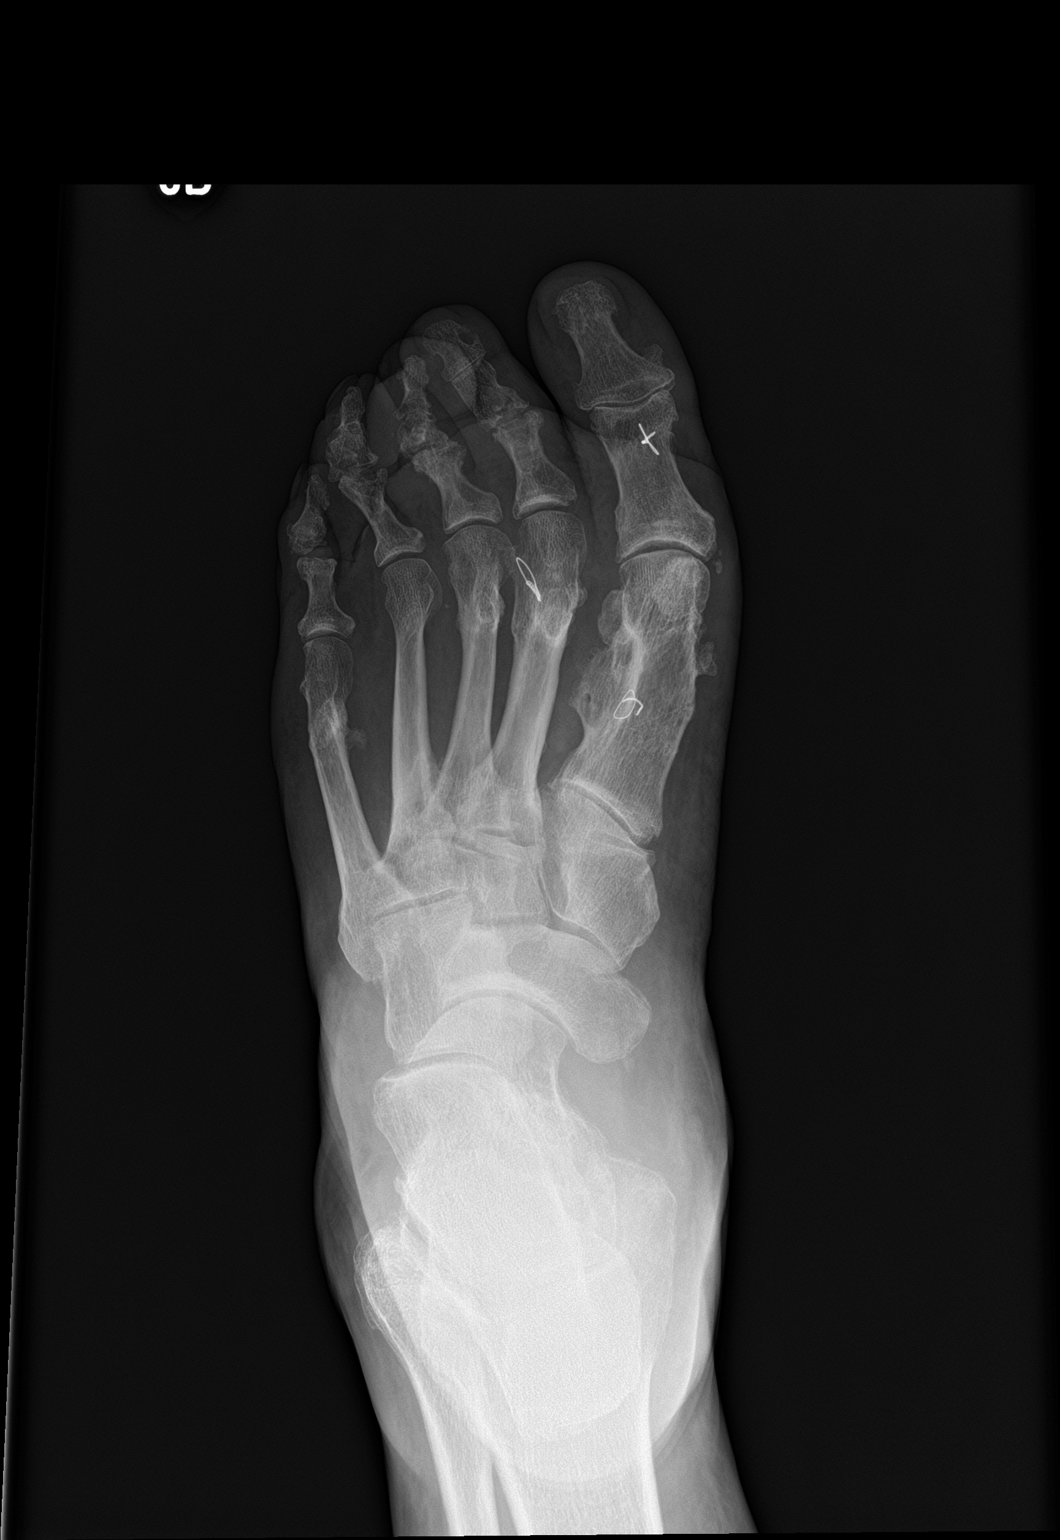

[foot obl]
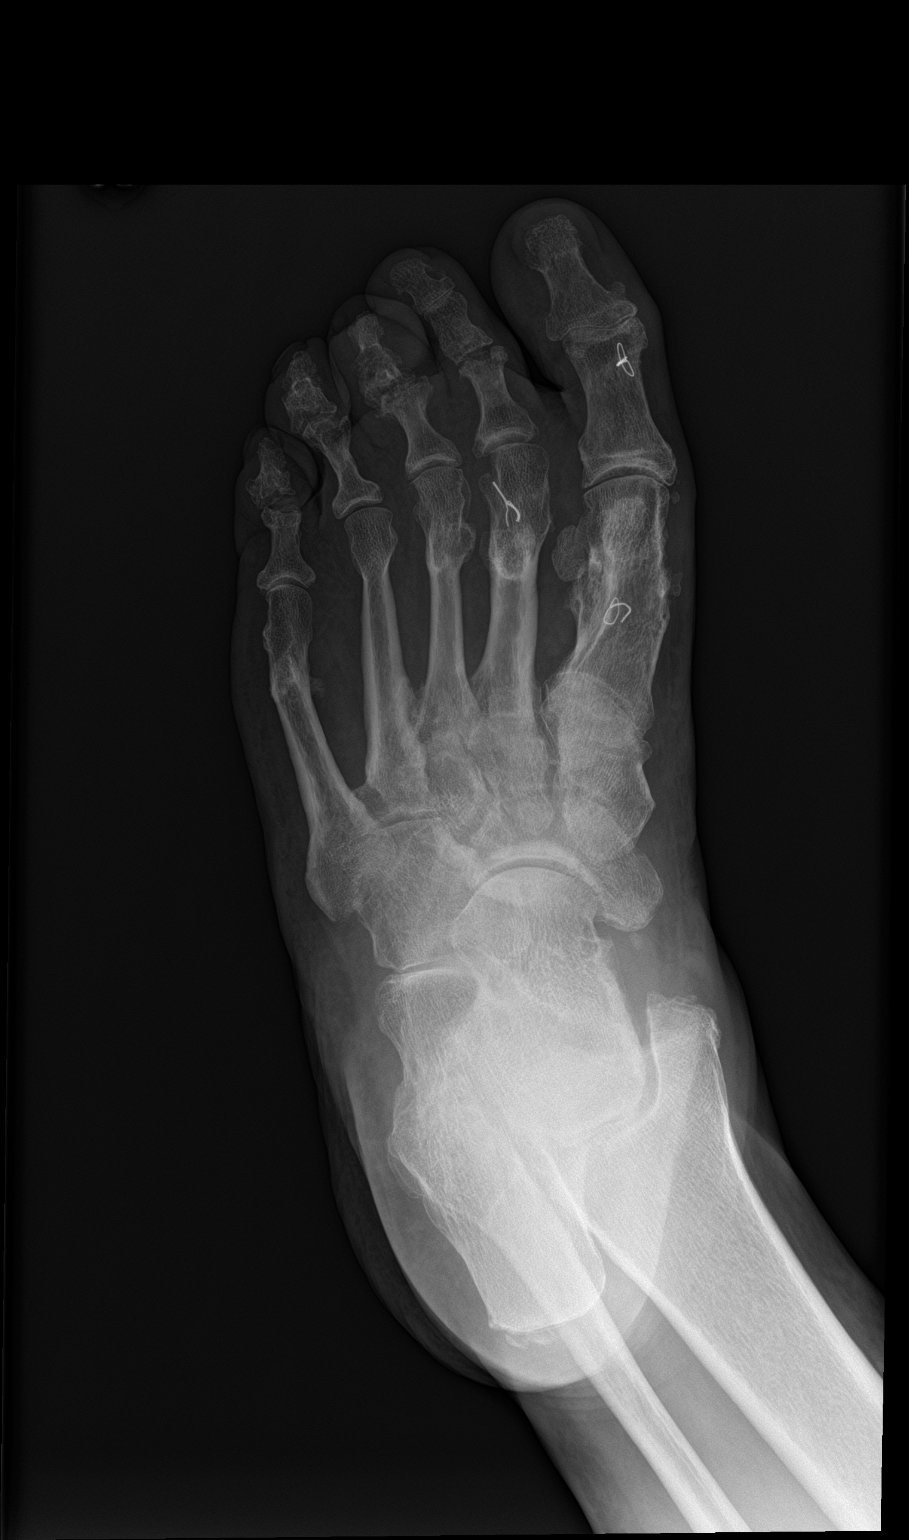

[foot lat]
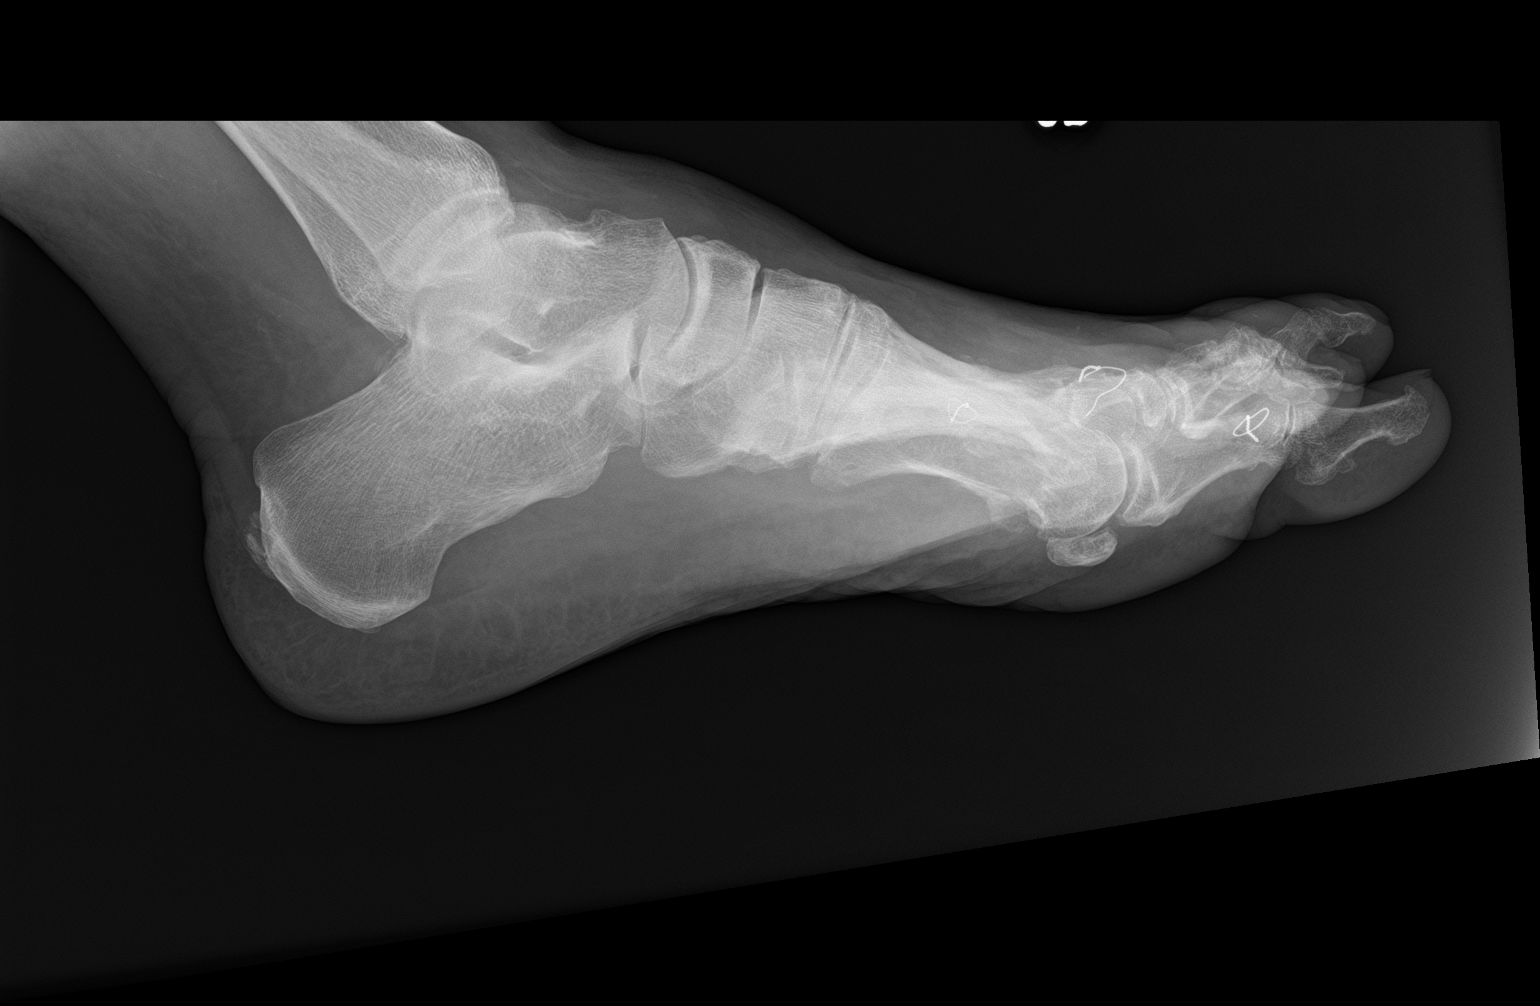

[3 of 3 positions shown; findings below may reference images not displayed]

FINDINGS: No fracture or dislocation of the left foot. Postoperative findings
of bunion and hammertoe osteotomy. Probable chronic fracture
deformity of the distal left fifth metatarsal. Mild arthrosis of the
metatarsophalangeal joints and midfoot. Diffuse soft tissue edema
about the forefoot.
IMPRESSION: 1.  No fracture or dislocation of the left foot.

2.  No radiographic evidence of erosive arthropathy.

3.  Diffuse soft tissue edema about the forefoot.

## 2021-02-22 NOTE — ED Triage Notes (Signed)
Lt Third toe pain, red and swollen x 3 weeks Had a pedicure about one month ago started turning red a few days after. The pain moves around the foot, from the toe to the arch, he soaks in vinegar, peroxide, and it seems to help.

## 2021-02-22 NOTE — Discharge Instructions (Signed)
May take Tylenol as needed for pain. Please monitor your blood pressure several times weekly, at different times of the day, record on a calendar with times of measurement and take this record to your Family Doctor if your blood pressure remains elevated.

## 2021-02-22 NOTE — ED Provider Notes (Signed)
[facility]    CSN: [id number] Arrival date & time: 02/22/21  1439      History   Chief Complaint Chief Complaint  Patient presents with   Foot Pain    HPI Marc Krueger is a 65 y.o. male.   Patient complains of approximately 3 week history of swelling and mild pain in his left forefoot, and wonders if he might have gout (he denies past history of gout).  He had a pedicure in that interval and recalls developing redness of his left third toe but denies drainage from toenail afterwards.  He notes that the pain in his forefoot is migratory and does not affect his ambulation.  He feels well otherwise and denies fevers, chills, and sweats.  He has had no pain/swelling in other joints.  He recalls no recent injury to his left foot. Patient had a traumatic injury to his left foot as a child, and subsequent bunion repair and hammertoe osteotomy. He has no family history of gout.  The history is provided by the patient.  Foot Pain This is a chronic problem. Episode onset: 3 to 4 weeks ago. The problem occurs constantly. The problem has not changed since onset.Associated symptoms comments: None . The symptoms are aggravated by walking. Nothing relieves the symptoms. Treatments tried: soaks in vinegar and peroxide. The treatment provided mild relief.   Past Medical History:  Diagnosis Date   Allergy 11-23-1012   seasonal alllergy   Arthritis    osteoarthritis-knees   Sinusitis    occ./occ. ringing in ears    Patient Active Problem List   Diagnosis Date Noted   Pulmonary nodules 08/16/2013   Chronic cough 05/10/2013   Expected blood loss anemia 12/08/2012   Obese 12/08/2012   S/P left TKA 12/07/2012   Obesity (BMI 30.0-34.9) 03/19/2012   Post-nasal drip 03/19/2012   Osteoarthritis of both knees 03/19/2012    Past Surgical History:  Procedure Laterality Date   foot sugery Bilateral 1980   JOINT REPLACEMENT     LASIK     TOTAL KNEE ARTHROPLASTY Left 12/07/2012    Procedure: LEFT TOTAL KNEE ARTHROPLASTY;  Surgeon: Mauri Pole, MD;  Location: [facility];  Service: Orthopedics;  Laterality: Left;       Home Medications    Prior to Admission medications   Medication Sig Start Date End Date Taking? Authorizing Provider  acetaminophen (TYLENOL) 500 MG tablet Take 500 mg by mouth every 6 (six) hours as needed.   Yes [provider]    Family History Family History  Problem Relation Age of Onset   Arthritis Mother    Heart disease Mother    Diabetes Mother    Lung cancer Sister    Arthritis Brother    Diabetes Brother    Arthritis Brother     Social History Social History   Tobacco Use   Smoking status: Never   Smokeless tobacco: Never  Vaping Use   Vaping Use: Never used  Substance Use Topics   Alcohol use: Yes    Comment: 1 per week   Drug use: No     Allergies   Patient has no known allergies.   Review of Systems Review of Systems  Constitutional:  Negative for activity change, appetite change, chills, diaphoresis, fatigue and fever.  Musculoskeletal:  Positive for joint swelling.  Skin:  Positive for color change. Negative for rash and wound.  Neurological:  Negative for numbness.  All other systems reviewed and are negative.  Physical Exam Triage Vital Signs ED Triage Vitals  Enc Vitals Group     BP 02/22/21 1511 (!) 158/98     Pulse Rate 02/22/21 1511 (!) 50     Resp 02/22/21 1511 18     Temp 02/22/21 1511 98.3 F (36.8 C)     Temp Source 02/22/21 1511 Oral     SpO2 02/22/21 1511 98 %     Weight 02/22/21 1512 235 lb (106.6 kg)     Height 02/22/21 1512 5\' 11"  (1.803 m)     Head Circumference --      Peak Flow --      Pain Score 02/22/21 1512 2     Pain Loc --      Pain Edu? --      Excl. in [place]? --    No data found.  Updated Vital Signs BP (!) 158/98 (BP Location: Left Arm)   Pulse (!) 50   Temp 98.3 F (36.8 C) (Oral)   Resp 18   Ht 5\' 11"  (1.803 m)   Wt 106.6 kg   SpO2 98%   BMI 32.78  kg/m   Visual Acuity Right Eye Distance:   Left Eye Distance:   Bilateral Distance:    Right Eye Near:   Left Eye Near:    Bilateral Near:     Physical Exam Vitals and nursing note reviewed.  Constitutional:      General: He is not in acute distress. HENT:     Head: Normocephalic.  Eyes:     Pupils: Pupils are equal, round, and reactive to light.  Cardiovascular:     Rate and Rhythm: Bradycardia present.  Pulmonary:     Effort: Pulmonary effort is normal.  Musculoskeletal:       Feet:     Comments: Toes and dorsum of left foot mildly erythematous and swollen, with mild tenderness to palpation.  Second toe chronically overlies the third toe.  Sensation and cap refill intact.  Skin:    General: Skin is warm and dry.  Neurological:     Mental Status: He is alert.     UC Treatments / Results  Labs (all labs ordered are listed, but only abnormal results are displayed) Labs Reviewed  CBC WITH DIFFERENTIAL/PLATELET  COMPLETE METABOLIC PANEL WITH GFR  URIC ACID  SEDIMENTATION RATE  C-REACTIVE PROTEIN    EKG   Radiology DG Foot Complete Left  Result Date: 02/22/2021 CLINICAL DATA:  Left foot pain and swelling, no known injury, gout EXAM: LEFT FOOT - COMPLETE 3+ VIEW COMPARISON:  None. FINDINGS: No fracture or dislocation of the left foot. Postoperative findings of bunion and hammertoe osteotomy. Probable chronic fracture deformity of the distal left fifth metatarsal. Mild arthrosis of the metatarsophalangeal joints and midfoot. Diffuse soft tissue edema about the forefoot. IMPRESSION: 1.  No fracture or dislocation of the left foot. 2.  No radiographic evidence of erosive arthropathy. 3.  Diffuse soft tissue edema about the forefoot. Electronically Signed   By: Delanna Ahmadi M.D.   On: 02/22/2021 16:11    Procedures Procedures (including critical care time)  Medications Ordered in UC Medications - No data to display  Initial Impression / Assessment and Plan / UC  Course  I have reviewed the triage vital signs and the nursing notes.  Pertinent labs & imaging results that were available during my care of the patient were reviewed by me and considered in my medical decision making (see chart for details).    Suspect  chronic gout.  Does not appear to be cellulitis.  Note no radiographic evidence of erosive arthropathy. CBC, Uric acid, Sed rate, CRP, and CMP pending. Note elevated blood pressure today; patient states that his BP is normally 140/90 or less.  Final Clinical Impressions(s) / UC Diagnoses   Final diagnoses:  Foot pain, left     Discharge Instructions      May take Tylenol as needed for pain. Please monitor your blood pressure several times weekly, at different times of the day, record on a calendar with times of measurement and take this record to your Family Doctor if your blood pressure remains elevated.    ED Prescriptions   None       Kandra Nicolas, MD 02/23/21 1134

## 2021-02-23 ENCOUNTER — Telehealth: Payer: Self-pay | Admitting: Family Medicine

## 2021-02-23 LAB — COMPLETE METABOLIC PANEL WITH GFR
AG Ratio: 1.4 (calc) (ref 1.0–2.5)
ALT: 13 U/L (ref 9–46)
AST: 13 U/L (ref 10–35)
Albumin: 4.2 g/dL (ref 3.6–5.1)
Alkaline phosphatase (APISO): 92 U/L (ref 35–144)
BUN/Creatinine Ratio: 15 (calc) (ref 6–22)
BUN: 20 mg/dL (ref 7–25)
CO2: 19 mmol/L — ABNORMAL LOW (ref 20–32)
Calcium: 9.4 mg/dL (ref 8.6–10.3)
Chloride: 104 mmol/L (ref 98–110)
Creat: 1.36 mg/dL — ABNORMAL HIGH (ref 0.70–1.35)
Globulin: 3.1 g/dL (calc) (ref 1.9–3.7)
Glucose, Bld: 97 mg/dL (ref 65–99)
Potassium: 4.5 mmol/L (ref 3.5–5.3)
Sodium: 136 mmol/L (ref 135–146)
Total Bilirubin: 1.1 mg/dL (ref 0.2–1.2)
Total Protein: 7.3 g/dL (ref 6.1–8.1)
eGFR: 58 mL/min/{1.73_m2} — ABNORMAL LOW (ref >=60)

## 2021-02-23 LAB — CBC WITH DIFFERENTIAL/PLATELET
Absolute Monocytes: 902 cells/uL (ref 200–950)
Basophils Absolute: 46 cells/uL (ref 0–200)
Basophils Relative: 0.5 %
Eosinophils Absolute: 193 cells/uL (ref 15–500)
Eosinophils Relative: 2.1 %
HCT: 41.5 % (ref 38.5–50.0)
Hemoglobin: 14.1 g/dL (ref 13.2–17.1)
Lymphs Abs: 1950 cells/uL (ref 850–3900)
MCH: 30.9 pg (ref 27.0–33.0)
MCHC: 34 g/dL (ref 32.0–36.0)
MCV: 91 fL (ref 80.0–100.0)
MPV: 9.9 fL (ref 7.5–12.5)
Monocytes Relative: 9.8 %
Neutro Abs: 6109 cells/uL (ref 1500–7800)
Neutrophils Relative %: 66.4 %
Platelets: 371 10*3/uL (ref 140–400)
RBC: 4.56 10*6/uL (ref 4.20–5.80)
RDW: 12.7 % (ref 11.0–15.0)
Total Lymphocyte: 21.2 %
WBC: 9.2 10*3/uL (ref 3.8–10.8)

## 2021-02-23 LAB — C-REACTIVE PROTEIN: CRP: 76.6 mg/L — ABNORMAL HIGH (ref <8.0)

## 2021-02-23 LAB — SEDIMENTATION RATE

## 2021-02-23 LAB — URIC ACID: Uric Acid, Serum: 6.9 mg/dL (ref 4.0–8.0)

## 2021-02-23 LAB — SED RATE MANUAL WEST RFLX: SED RATE BY MODIFIED WESTERGREN,MANUAL: 37 mm/h — ABNORMAL HIGH (ref 0–20)

## 2021-02-23 MED ORDER — PREDNISONE 20 MG PO TABS: ORAL_TABLET | ORAL | 0 refills | Status: DC

## 2021-02-23 NOTE — Telephone Encounter (Signed)
Lab results: WBC normal (9.2) CMP:  increased creatinine 1.36, eGFR 58 Uric acid high normal:  6.9 mg/dL Elevated CRP 76.6 and elevated Sed Rate 37.  ASSESSMENT: Suspect chronic gout  PLAN: Rx for prednisone burst/taper. Recommend referral to rheumatologist.

## 2021-02-26 ENCOUNTER — Telehealth: Payer: Self-pay | Admitting: Emergency Medicine

## 2021-02-26 NOTE — Telephone Encounter (Signed)
Call from [place] regarding lab results - pt had received a call from [place] to pick up medicine today. Dr Assunta Found had reviewed chart on 02/23/21 after labs resulted. Lab results reviewed w/ Aaron Edelman per Dr Aurelio Brash notes. RN also verbally informed Marc Krueger that Dr Assunta Found is requesting he follow up with a rheumatologist. Pt does not have health insurance at this time. Medicare will start in 6 months per pt. RN stated he should attempt to make appointments now  so he can be seen & to establish care in 6 month with a PCP & a rheumatologist. RN also made pt aware of [facility] clinics for follow up & low income programs at [facility]. Pt verbalized an understanding that he needed to increase fluids & follow dietary recommendations to decrease gout symptoms. Results also reviewed by Dr Mannie Stabile. Pt may follow up for a recheck of his creatinine in a few days per Dr Mannie Stabile

## 2021-03-07 ENCOUNTER — Emergency Department
Admission: EM | Admit: 2021-03-07 | Discharge: 2021-03-07 | Disposition: A | Discharge status: Home / Self Care | Payer: Self-pay | Source: Home / Self Care

## 2021-03-07 ENCOUNTER — Encounter (HOSPITAL_BASED_OUTPATIENT_CLINIC_OR_DEPARTMENT_OTHER): Payer: Self-pay | Admitting: *Deleted

## 2021-03-07 ENCOUNTER — Other Ambulatory Visit: Payer: Self-pay

## 2021-03-07 ENCOUNTER — Emergency Department (HOSPITAL_BASED_OUTPATIENT_CLINIC_OR_DEPARTMENT_OTHER): Payer: Self-pay

## 2021-03-07 ENCOUNTER — Encounter: Payer: Self-pay | Admitting: Emergency Medicine

## 2021-03-07 ENCOUNTER — Inpatient Hospital Stay (HOSPITAL_BASED_OUTPATIENT_CLINIC_OR_DEPARTMENT_OTHER)
Admission: EM | Admit: 2021-03-07 | Discharge: 2021-03-11 | DRG: 602 | Disposition: A | Discharge status: Home / Self Care | Payer: Self-pay | Attending: Family Medicine | Admitting: Family Medicine

## 2021-03-07 DIAGNOSIS — M109 Gout, unspecified: Secondary | ICD-10-CM | POA: Diagnosis present

## 2021-03-07 DIAGNOSIS — R600 Localized edema: Secondary | ICD-10-CM

## 2021-03-07 DIAGNOSIS — L03119 Cellulitis of unspecified part of limb: Secondary | ICD-10-CM

## 2021-03-07 DIAGNOSIS — M170 Bilateral primary osteoarthritis of knee: Secondary | ICD-10-CM | POA: Diagnosis present

## 2021-03-07 DIAGNOSIS — Z96652 Presence of left artificial knee joint: Secondary | ICD-10-CM | POA: Diagnosis present

## 2021-03-07 DIAGNOSIS — M25461 Effusion, right knee: Secondary | ICD-10-CM

## 2021-03-07 DIAGNOSIS — B353 Tinea pedis: Secondary | ICD-10-CM | POA: Diagnosis present

## 2021-03-07 DIAGNOSIS — R509 Fever, unspecified: Secondary | ICD-10-CM

## 2021-03-07 DIAGNOSIS — L03115 Cellulitis of right lower limb: Secondary | ICD-10-CM

## 2021-03-07 DIAGNOSIS — Z20822 Contact with and (suspected) exposure to covid-19: Secondary | ICD-10-CM | POA: Diagnosis present

## 2021-03-07 DIAGNOSIS — L03116 Cellulitis of left lower limb: Principal | ICD-10-CM | POA: Diagnosis present

## 2021-03-07 DIAGNOSIS — R7989 Other specified abnormal findings of blood chemistry: Secondary | ICD-10-CM | POA: Diagnosis present

## 2021-03-07 DIAGNOSIS — E871 Hypo-osmolality and hyponatremia: Secondary | ICD-10-CM | POA: Diagnosis present

## 2021-03-07 DIAGNOSIS — M79672 Pain in left foot: Secondary | ICD-10-CM

## 2021-03-07 DIAGNOSIS — Z833 Family history of diabetes mellitus: Secondary | ICD-10-CM

## 2021-03-07 DIAGNOSIS — Z8261 Family history of arthritis: Secondary | ICD-10-CM

## 2021-03-07 DIAGNOSIS — A419 Sepsis, unspecified organism: Secondary | ICD-10-CM | POA: Diagnosis not present

## 2021-03-07 DIAGNOSIS — N182 Chronic kidney disease, stage 2 (mild): Secondary | ICD-10-CM | POA: Diagnosis present

## 2021-03-07 DIAGNOSIS — M79671 Pain in right foot: Principal | ICD-10-CM

## 2021-03-07 DIAGNOSIS — R7303 Prediabetes: Secondary | ICD-10-CM | POA: Diagnosis present

## 2021-03-07 HISTORY — DX: Cellulitis of unspecified part of limb: L03.119

## 2021-03-07 HISTORY — DX: Cellulitis of left lower limb: L03.116

## 2021-03-07 LAB — URINALYSIS, ROUTINE W REFLEX MICROSCOPIC
Bilirubin Urine: NEGATIVE
Glucose, UA: 100 mg/dL — AB
Ketones, ur: NEGATIVE mg/dL
Leukocytes,Ua: NEGATIVE
Nitrite: NEGATIVE
Protein, ur: NEGATIVE mg/dL
Specific Gravity, Urine: 1.025 (ref 1.005–1.030)
pH: 5.5 (ref 5.0–8.0)

## 2021-03-07 LAB — CBC WITH DIFFERENTIAL/PLATELET
Abs Immature Granulocytes: 0.21 10*3/uL — ABNORMAL HIGH (ref 0.00–0.07)
Basophils Absolute: 0.1 10*3/uL (ref 0.0–0.1)
Basophils Relative: 0 %
Eosinophils Absolute: 0 10*3/uL (ref 0.0–0.5)
Eosinophils Relative: 0 %
HCT: 41.5 % (ref 39.0–52.0)
Hemoglobin: 14.3 g/dL (ref 13.0–17.0)
Immature Granulocytes: 1 %
Lymphocytes Relative: 5 %
Lymphs Abs: 1 10*3/uL (ref 0.7–4.0)
MCH: 31.2 pg (ref 26.0–34.0)
MCHC: 34.5 g/dL (ref 30.0–36.0)
MCV: 90.6 fL (ref 80.0–100.0)
Monocytes Absolute: 2.1 10*3/uL — ABNORMAL HIGH (ref 0.1–1.0)
Monocytes Relative: 10 %
Neutro Abs: 17.3 10*3/uL — ABNORMAL HIGH (ref 1.7–7.7)
Neutrophils Relative %: 84 %
Platelets: 366 10*3/uL (ref 150–400)
RBC: 4.58 MIL/uL (ref 4.22–5.81)
RDW: 12.9 % (ref 11.5–15.5)
WBC: 20.6 10*3/uL — ABNORMAL HIGH (ref 4.0–10.5)
nRBC: 0 % (ref 0.0–0.2)

## 2021-03-07 LAB — COMPREHENSIVE METABOLIC PANEL
ALT: 17 U/L (ref 0–44)
AST: 16 U/L (ref 15–41)
Albumin: 3.8 g/dL (ref 3.5–5.0)
Alkaline Phosphatase: 82 U/L (ref 38–126)
Anion gap: 11 (ref 5–15)
BUN: 21 mg/dL (ref 8–23)
CO2: 25 mmol/L (ref 22–32)
Calcium: 8.9 mg/dL (ref 8.9–10.3)
Chloride: 97 mmol/L — ABNORMAL LOW (ref 98–111)
Creatinine, Ser: 1.3 mg/dL — ABNORMAL HIGH (ref 0.61–1.24)
GFR, Estimated: >60 mL/min (ref >60)
Glucose, Bld: 187 mg/dL — ABNORMAL HIGH (ref 70–99)
Potassium: 4.6 mmol/L (ref 3.5–5.1)
Sodium: 133 mmol/L — ABNORMAL LOW (ref 135–145)
Total Bilirubin: 1.7 mg/dL — ABNORMAL HIGH (ref 0.3–1.2)
Total Protein: 7.7 g/dL (ref 6.5–8.1)

## 2021-03-07 LAB — C-REACTIVE PROTEIN: CRP: 18.6 mg/dL — ABNORMAL HIGH (ref <1.0)

## 2021-03-07 LAB — RESP PANEL BY RT-PCR (FLU A&B, COVID) ARPGX2
Influenza A by PCR: NEGATIVE
Influenza B by PCR: NEGATIVE
SARS Coronavirus 2 by RT PCR: NEGATIVE

## 2021-03-07 LAB — BRAIN NATRIURETIC PEPTIDE: B Natriuretic Peptide: 217.7 pg/mL — ABNORMAL HIGH (ref 0.0–100.0)

## 2021-03-07 LAB — URIC ACID: Uric Acid, Serum: 7 mg/dL (ref 3.7–8.6)

## 2021-03-07 LAB — URINALYSIS, MICROSCOPIC (REFLEX): WBC, UA: NONE SEEN WBC/hpf (ref 0–5)

## 2021-03-07 LAB — GLUCOSE, CAPILLARY: Glucose-Capillary: 299 mg/dL — ABNORMAL HIGH (ref 70–99)

## 2021-03-07 LAB — SEDIMENTATION RATE: Sed Rate: 22 mm/hr — ABNORMAL HIGH (ref 0–16)

## 2021-03-07 IMAGING — DX DG FOOT COMPLETE 3+V*L*
3 series · 3 of 3 positions shown · non-contrast
Comparison: None.

CLINICAL DATA: Toe pain.  Cellulitis

EXAM:
RIGHT FOOT COMPLETE - 3+ VIEW

[foot ap]
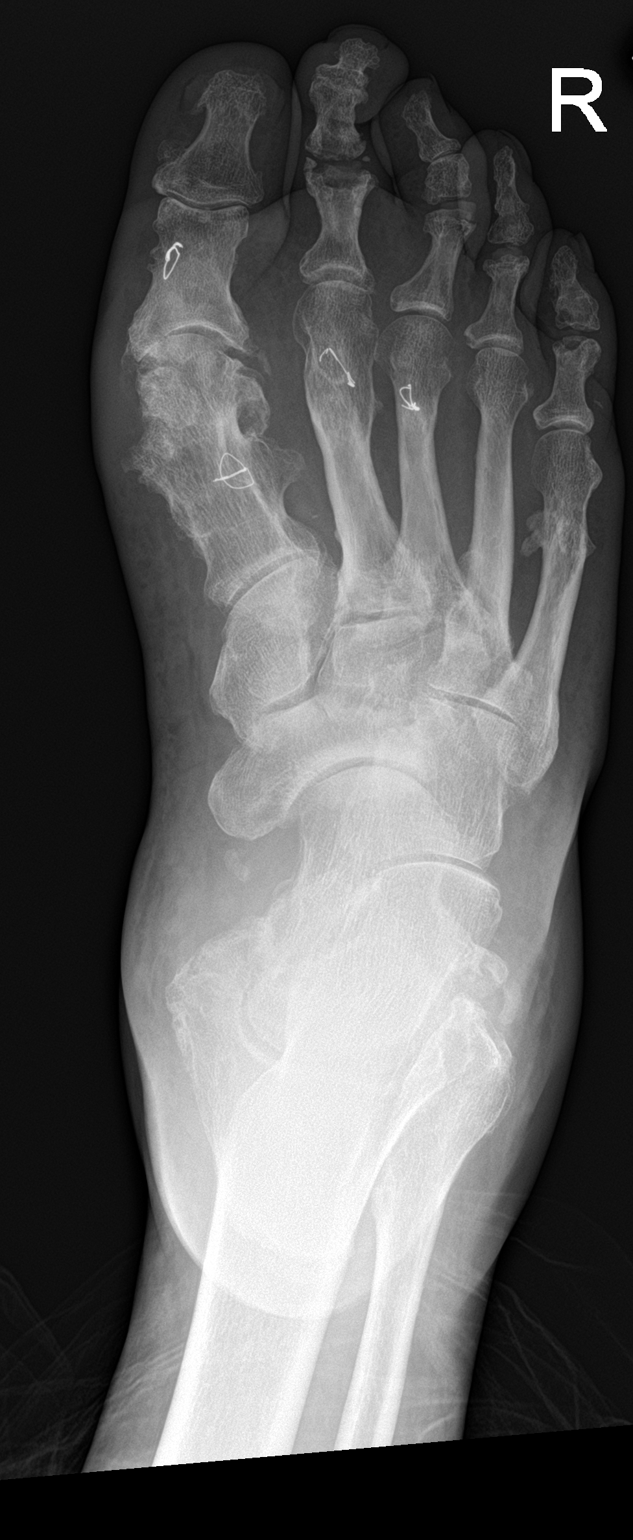

[foot obl]
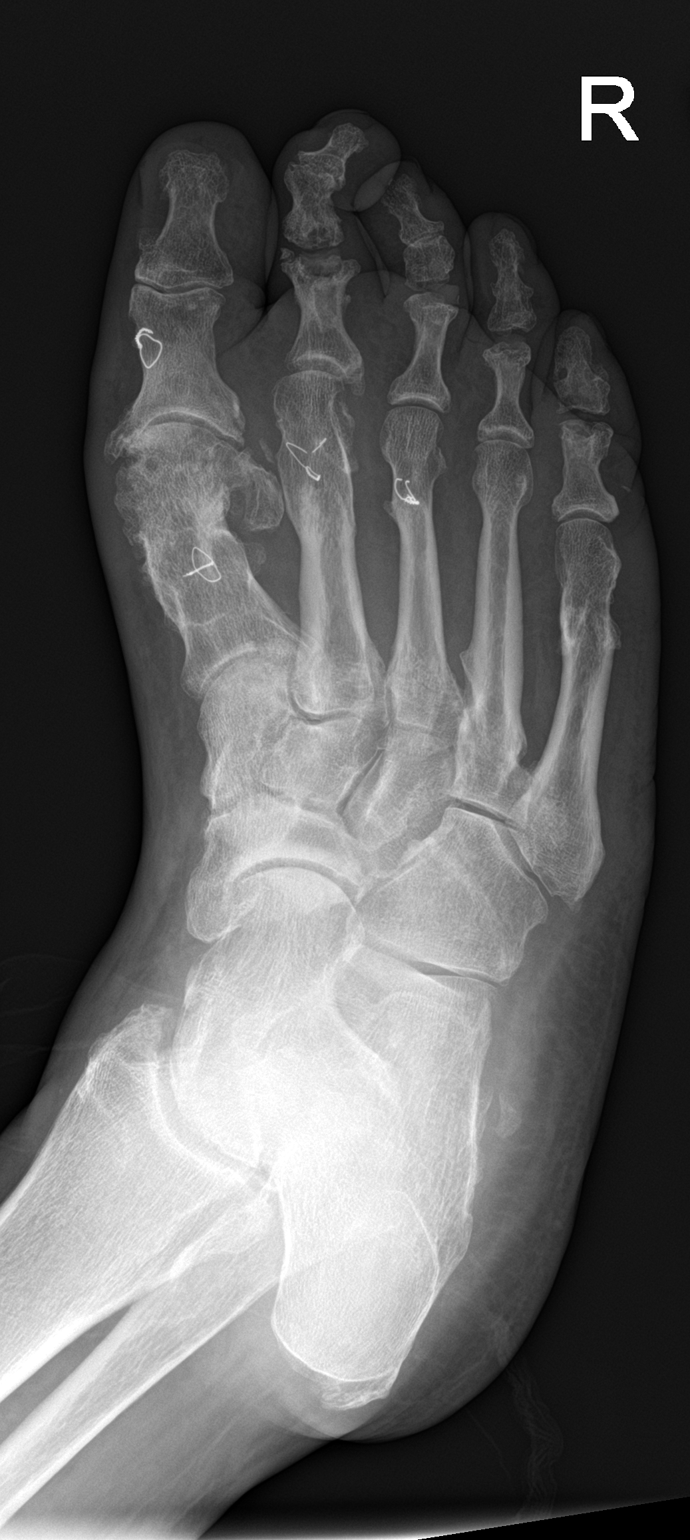

[foot lat]
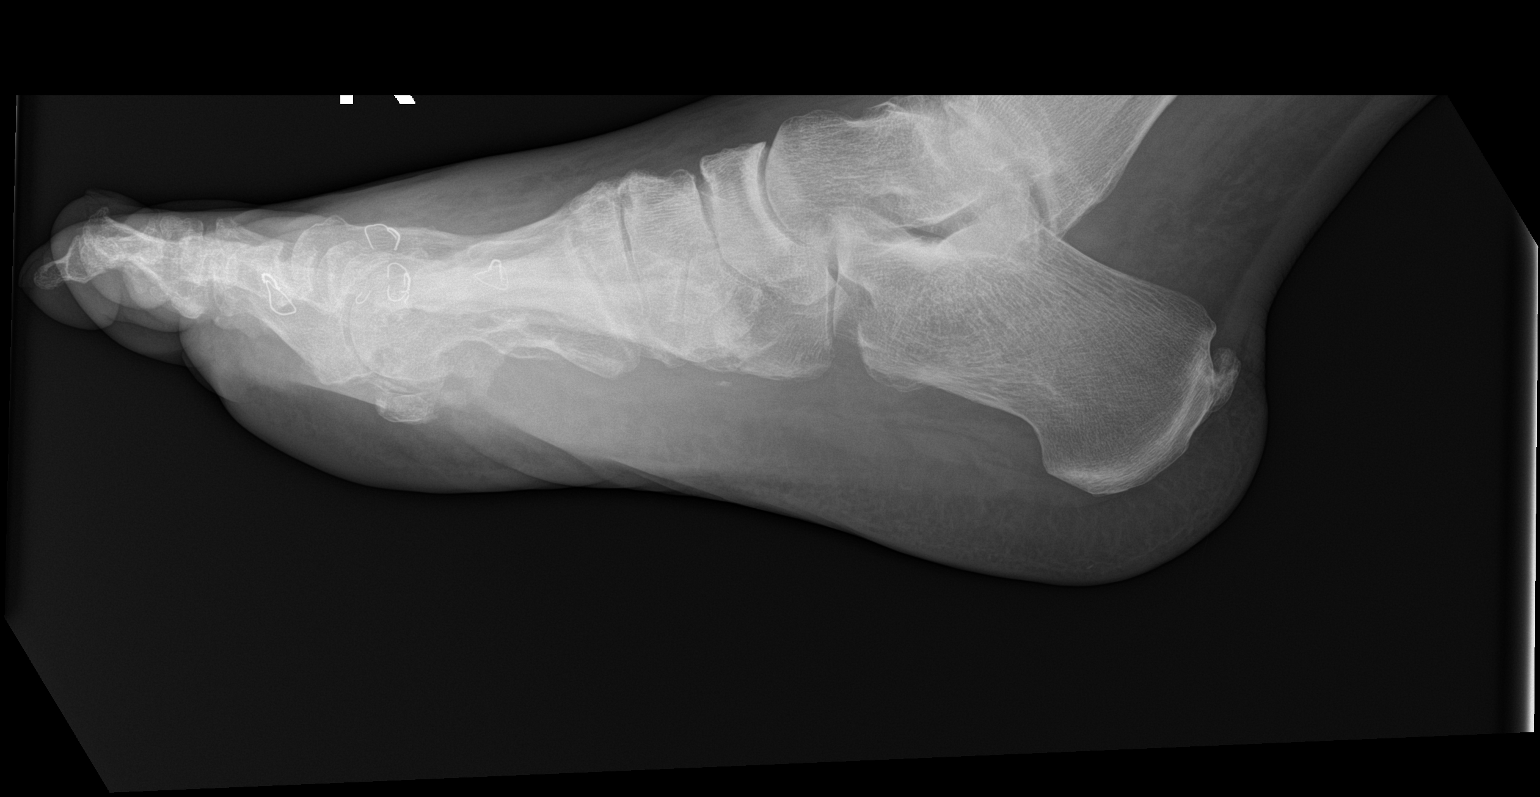

[3 of 3 positions shown; findings below may reference images not displayed]

FINDINGS: Soft tissue swelling medial foot. Negative for fracture or
osteomyelitis

Prior foot surgery. Prior bunionectomy. There is extensive
irregularity of the first metatarsal which could be due to chronic
osteomyelitis which has healed. Moderate degenerative change in
spurring in the first MTP joint. Surgical wires overlying the first
second and third metatarsals and first proximal phalanx.

Widening of the joint space of the second PIP joint with possible
prosthesis. Similar findings in the third PIP joint. Correlate with
prior history.
IMPRESSION: Medial soft tissue swelling. Negative for acute osteomyelitis or
fracture.

Prior bunionectomy and surgery in the foot. Extensive cortical
irregularity of the first metatarsal may be due to prior surgery or
chronic osteomyelitis.

## 2021-03-07 IMAGING — DX DG FOOT COMPLETE 3+V*R*
3 series · 3 of 3 positions shown · non-contrast
Comparison: [DATE]

CLINICAL DATA: Toe pain.  Foot swelling.

EXAM:
LEFT FOOT - COMPLETE 3+ VIEW

[foot ap]
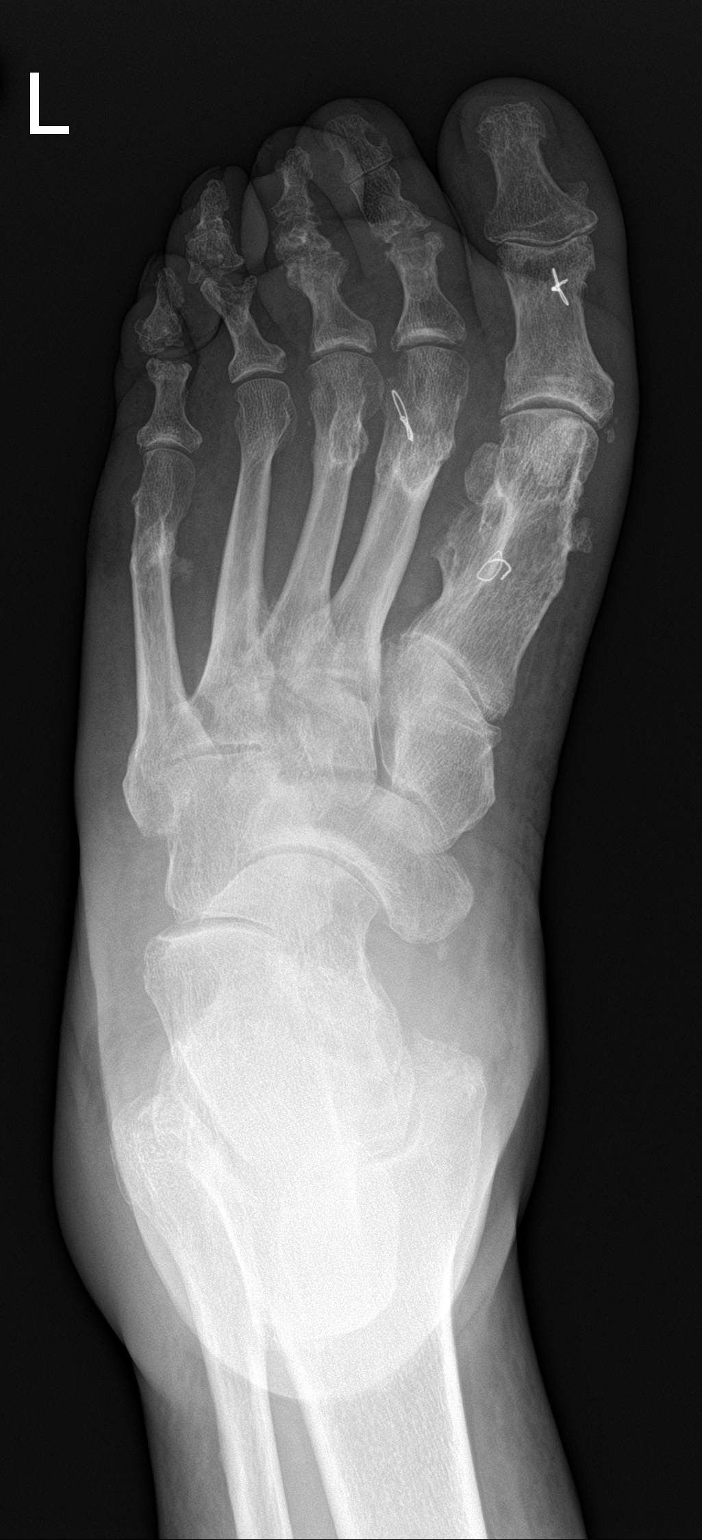

[foot obl]
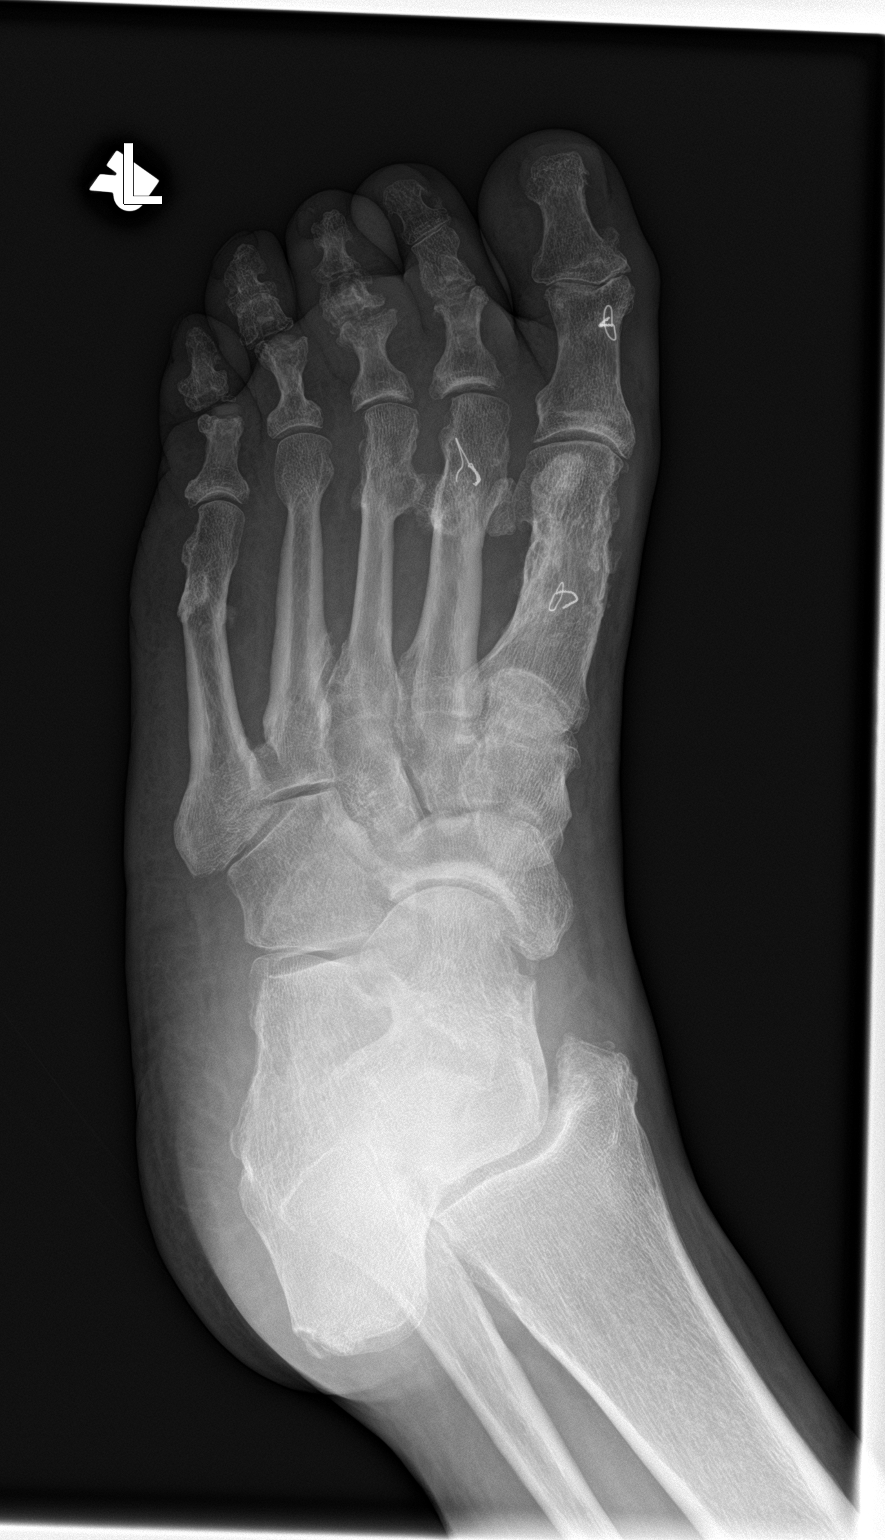

[foot lat]
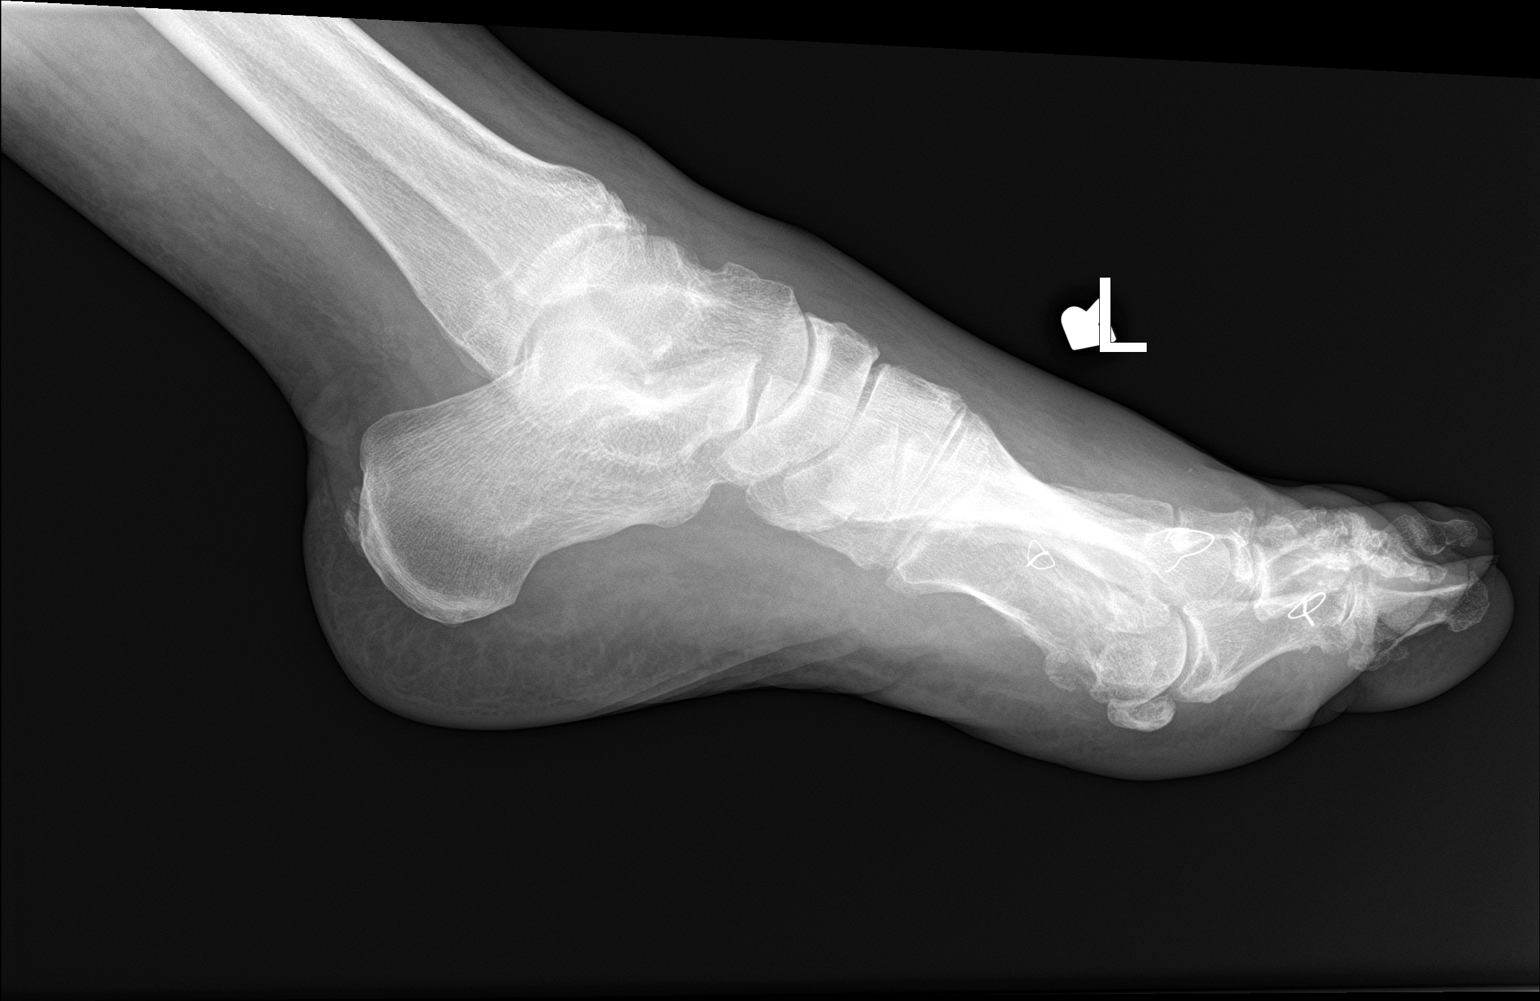

[3 of 3 positions shown; findings below may reference images not displayed]

FINDINGS: There is mild diffuse osteopenia. Postoperative changes from
previous repair of hallux valgus deformity noted. Chronic
deformities involving the distal aspect of the first, second, third,
and fifth metatarsal bones appears stable from the previous exam.
Arthro pathic changes are noted involving the PIP joints of the
second, third, fourth and fifth digits. There are no signs of acute
fracture or dislocation. No focal bone erosions to suggest
osteomyelitis. Dorsal soft tissue swelling is identified overlying
the metatarsal bones.
IMPRESSION: 1. No acute bone abnormality.
2. Chronic deformities involve the first, second, third, and fifth
metatarsal bones.
3. Dorsal soft tissue swelling.

## 2021-03-07 MED ORDER — ACETAMINOPHEN 650 MG RE SUPP: 650.0000 mg | Freq: Four times a day (QID) | RECTAL | Status: DC | PRN

## 2021-03-07 MED ORDER — PIPERACILLIN-TAZOBACTAM 3.375 G IVPB 30 MIN
3.3750 g | Freq: Once | INTRAVENOUS | Status: AC
  Administered 2021-03-07: 3.375 g via INTRAVENOUS
  Filled 2021-03-07: qty 50

## 2021-03-07 MED ORDER — OXYCODONE HCL 5 MG PO TABS
5.0000 mg | ORAL_TABLET | ORAL | Status: DC | PRN
  Administered 2021-03-07 – 2021-03-08 (×2): 5 mg via ORAL
  Filled 2021-03-07 (×2): qty 1

## 2021-03-07 MED ORDER — ACETAMINOPHEN 325 MG PO TABS
650.0000 mg | ORAL_TABLET | Freq: Four times a day (QID) | ORAL | Status: DC | PRN
  Administered 2021-03-07 – 2021-03-10 (×5): 650 mg via ORAL
  Filled 2021-03-07 (×5): qty 2

## 2021-03-07 MED ORDER — INSULIN ASPART 100 UNIT/ML IJ SOLN: 0.0000 [IU] | Freq: Three times a day (TID) | INTRAMUSCULAR | Status: DC

## 2021-03-07 MED ORDER — ENOXAPARIN SODIUM 60 MG/0.6ML IJ SOSY
50.0000 mg | PREFILLED_SYRINGE | INTRAMUSCULAR | Status: DC
  Administered 2021-03-07 – 2021-03-10 (×4): 50 mg via SUBCUTANEOUS
  Filled 2021-03-07 (×4): qty 0.6

## 2021-03-07 MED ORDER — MORPHINE SULFATE (PF) 2 MG/ML IV SOLN
2.0000 mg | Freq: Once | INTRAVENOUS | Status: AC
  Administered 2021-03-07: 2 mg via INTRAVENOUS
  Filled 2021-03-07: qty 1

## 2021-03-07 MED ORDER — SODIUM CHLORIDE 0.9 % IV SOLN
2.0000 g | INTRAVENOUS | Status: DC
  Administered 2021-03-07 – 2021-03-09 (×3): 2 g via INTRAVENOUS
  Filled 2021-03-07 (×3): qty 20

## 2021-03-07 NOTE — H&P (Signed)
History and Physical    Marc Krueger TDD:[id number] DOB: 01-Apr-1956 DOA: 03/07/2021  PCP: Pcp, No  Patient coming from: Home.  Chief Complaint: Foot pain and swelling.  HPI: Marc Krueger is a 65 y.o. male with no significant past medical history presents to the ER with persistent pain in the both feet for the last 3 to 4 weeks.  Patient states his symptoms started about 3 to 4 weeks ago when he had a pedicure done and started off on the third toe on the left foot with swelling and redness which moved proximally to the left foot.  Had come to the ER on February 22, 2021 about 2 weeks ago at that time patient CRP was around 79.  Uric acid was normal.  Patient was empirically treated by primary care physician for possible gout with prednisone.  Despite taking which patient's symptoms worsened and started involving the right foot now which is acutely worsened over the last 48 hours and decided to come to the ER.  ED Course: In the ER x-rays do not show any definite evidence of osteomyelitis.  Labs show elevated creatinine of 1.3 CRP of 18.6 which is improved from 76.6 blood glucose of 187 WBC count of 20.6 and patient was having a temperature of 99 F COVID test negative.  Given worsening symptoms and on exam patient's both feet appear warm to touch but has good pulses and concerning features for cellulitis was started on empiric antibiotics after blood cultures obtained.  Uric acid is around 7.  Review of Systems: As per HPI, rest all negative.   Past Medical History:  Diagnosis Date   Allergy 11-23-1012   seasonal alllergy   Arthritis    osteoarthritis-knees   Sinusitis    occ./occ. ringing in ears    Past Surgical History:  Procedure Laterality Date   foot sugery Bilateral 1980   JOINT REPLACEMENT     LASIK     TOTAL KNEE ARTHROPLASTY Left 12/07/2012   Procedure: LEFT TOTAL KNEE ARTHROPLASTY;  Surgeon: Mauri Pole, MD;  Location: [facility];  Service: Orthopedics;  Laterality: Left;      reports that he has never smoked. He has never used smokeless tobacco. He reports current alcohol use. He reports that he does not use drugs.  No Known Allergies  Family History  Problem Relation Age of Onset   Arthritis Mother    Heart disease Mother    Diabetes Mother    Lung cancer Sister    Arthritis Brother    Diabetes Brother    Arthritis Brother     Prior to Admission medications   Medication Sig Start Date End Date Taking? Authorizing Provider  acetaminophen (TYLENOL) 500 MG tablet Take 500 mg by mouth every 6 (six) hours as needed.    [provider]  predniSONE (DELTASONE) 20 MG tablet Take one tab by mouth twice daily for 4 days, then one daily for 3 days. Take with food. Patient not taking: No sig reported 02/23/21   Kandra Nicolas, MD    Physical Exam: Constitutional: Moderately built and nourished. Vitals:   03/07/21 1513 03/07/21 1541 03/07/21 1600 03/07/21 1700  BP: 139/75  (!) 145/66 133/63  Pulse: (!) 47  97 83  Resp: 16  14 16   Temp:      TempSrc:      SpO2: 100% 100% 100% 100%  Weight:      Height:       Eyes: Anicteric no pallor. ENMT: No  discharge from the ears eyes nose and mouth. Neck: No mass felt.  No neck rigidity. Respiratory: No rhonchi or crepitations. Cardiovascular: S1-S2 heard. Abdomen: Soft nontender bowel sounds present. Musculoskeletal: Bilateral foot swelling with erythema and good pulses left foot third toe appears more erythematous than the rest of the toes. Skin: Both feet appear erythematous.  Warm to touch with left foot third toe appearing more erythematous. Neurologic: Alert awake oriented to time place and person moving all extremities. Psychiatric: Appears normal.  Normal affect.   Labs on Admission: I have personally reviewed following labs and imaging studies  CBC: Recent Labs  Lab 03/07/21 1348  WBC 20.6*  NEUTROABS 17.3*  HGB 14.3  HCT 41.5  MCV 90.6  PLT 448   Basic Metabolic Panel: Recent  Labs  Lab 03/07/21 1348  NA 133*  K 4.6  CL 97*  CO2 25  GLUCOSE 187*  BUN 21  CREATININE 1.30*  CALCIUM 8.9   GFR: Estimated Creatinine Clearance: 71.3 mL/min (A) (by C-G formula based on SCr of 1.3 mg/dL (H)). Liver Function Tests: Recent Labs  Lab 03/07/21 1348  AST 16  ALT 17  ALKPHOS 82  BILITOT 1.7*  PROT 7.7  ALBUMIN 3.8   No results for input(s): LIPASE, AMYLASE in the last 168 hours. No results for input(s): AMMONIA in the last 168 hours. Coagulation Profile: No results for input(s): INR, PROTIME in the last 168 hours. Cardiac Enzymes: No results for input(s): CKTOTAL, CKMB, CKMBINDEX, TROPONINI in the last 168 hours. BNP (last 3 results) No results for input(s): PROBNP in the last 8760 hours. HbA1C: No results for input(s): HGBA1C in the last 72 hours. CBG: No results for input(s): GLUCAP in the last 168 hours. Lipid Profile: No results for input(s): CHOL, HDL, LDLCALC, TRIG, CHOLHDL, LDLDIRECT in the last 72 hours. Thyroid Function Tests: No results for input(s): TSH, T4TOTAL, FREET4, T3FREE, THYROIDAB in the last 72 hours. Anemia Panel: No results for input(s): VITAMINB12, FOLATE, FERRITIN, TIBC, IRON, RETICCTPCT in the last 72 hours. Urine analysis:    Component Value Date/Time   COLORURINE AMBER (A) 03/07/2021 1616   APPEARANCEUR CLEAR 03/07/2021 1616   LABSPEC 1.025 03/07/2021 1616   PHURINE 5.5 03/07/2021 1616   GLUCOSEU 100 (A) 03/07/2021 1616   HGBUR SMALL (A) 03/07/2021 1616   BILIRUBINUR NEGATIVE 03/07/2021 1616   KETONESUR NEGATIVE 03/07/2021 1616   PROTEINUR NEGATIVE 03/07/2021 1616   UROBILINOGEN 0.2 12/02/2012 0910   NITRITE NEGATIVE 03/07/2021 1616   LEUKOCYTESUR NEGATIVE 03/07/2021 1616   Sepsis Labs: @LABRCNTIP (procalcitonin:4,lacticidven:4) ) Recent Results (from the past 240 hour(s))  Resp Panel by RT-PCR (Flu A&B, Covid) Nasopharyngeal Swab     Status: None   Collection Time: 03/07/21  3:35 PM   Specimen: Nasopharyngeal  Swab; Nasopharyngeal(NP) swabs in vial transport medium  Result Value Ref Range Status   SARS Coronavirus 2 by RT PCR NEGATIVE NEGATIVE Final    Comment: (NOTE) SARS-CoV-2 target nucleic acids are NOT DETECTED.  The SARS-CoV-2 RNA is generally detectable in upper respiratory specimens during the acute phase of infection. The lowest concentration of SARS-CoV-2 viral copies this assay can detect is 138 copies/mL. A negative result does not preclude SARS-Cov-2 infection and should not be used as the sole basis for treatment or other patient management decisions. A negative result may occur with  improper specimen collection/handling, submission of specimen other than nasopharyngeal swab, presence of viral mutation(s) within the areas targeted by this assay, and inadequate number of viral copies(<138 copies/mL). A negative  result must be combined with clinical observations, patient history, and epidemiological information. The expected result is Negative.  Fact Sheet for Patients:  EntrepreneurPulse.com.au  Fact Sheet for Healthcare Providers:  IncredibleEmployment.be  This test is no t yet approved or cleared by the [place] FDA and  has been authorized for detection and/or diagnosis of SARS-CoV-2 by FDA under an Emergency Use Authorization (EUA). This EUA will remain  in effect (meaning this test can be used) for the duration of the COVID-19 declaration under Section 564(b)(1) of the Act, 21 U.S.C.section 360bbb-3(b)(1), unless the authorization is terminated  or revoked sooner.       Influenza A by PCR NEGATIVE NEGATIVE Final   Influenza B by PCR NEGATIVE NEGATIVE Final    Comment: (NOTE) The Xpert Xpress SARS-CoV-2/FLU/RSV plus assay is intended as an aid in the diagnosis of influenza from Nasopharyngeal swab specimens and should not be used as a sole basis for treatment. Nasal washings and aspirates are unacceptable for Xpert Xpress  SARS-CoV-2/FLU/RSV testing.  Fact Sheet for Patients: EntrepreneurPulse.com.au  Fact Sheet for Healthcare Providers: IncredibleEmployment.be  This test is not yet approved or cleared by the [place] FDA and has been authorized for detection and/or diagnosis of SARS-CoV-2 by FDA under an Emergency Use Authorization (EUA). This EUA will remain in effect (meaning this test can be used) for the duration of the COVID-19 declaration under Section 564(b)(1) of the Act, 21 U.S.C. section 360bbb-3(b)(1), unless the authorization is terminated or revoked.  Performed at [facility], [address] [place], [place] [address]      Radiological Exams on Admission: DG Chest 2 View  Result Date: 03/07/2021 CLINICAL DATA:  Lower extremity edema. EXAM: CHEST - 2 VIEW COMPARISON:  Chest x-ray 11/26/2018, CT chest 10/10/2014 FINDINGS: The heart and mediastinal contours are within normal limits. No focal consolidation. No pulmonary edema. No pleural effusion. No pneumothorax. No acute osseous abnormality. IMPRESSION: No active cardiopulmonary disease. Electronically Signed   By: Iven Finn M.D.   On: 03/07/2021 16:00   DG Foot Complete Left  Result Date: 03/07/2021 CLINICAL DATA:  Toe pain.  Foot swelling. EXAM: LEFT FOOT - COMPLETE 3+ VIEW COMPARISON:  02/22/21 FINDINGS: There is mild diffuse osteopenia. Postoperative changes from previous repair of hallux valgus deformity noted. Chronic deformities involving the distal aspect of the first, second, third, and fifth metatarsal bones appears stable from the previous exam. Arthro pathic changes are noted involving the PIP joints of the second, third, fourth and fifth digits. There are no signs of acute fracture or dislocation. No focal bone erosions to suggest osteomyelitis. Dorsal soft tissue swelling is identified overlying the metatarsal bones. IMPRESSION: 1. No acute bone abnormality. 2. Chronic  deformities involve the first, second, third, and fifth metatarsal bones. 3. Dorsal soft tissue swelling. Electronically Signed   By: Kerby Moors M.D.   On: 03/07/2021 14:46   DG Foot Complete Right  Result Date: 03/07/2021 CLINICAL DATA:  Toe pain.  Cellulitis EXAM: RIGHT FOOT COMPLETE - 3+ VIEW COMPARISON:  None. FINDINGS: Soft tissue swelling medial foot. Negative for fracture or osteomyelitis Prior foot surgery. Prior bunionectomy. There is extensive irregularity of the first metatarsal which could be due to chronic osteomyelitis which has healed. Moderate degenerative change in spurring in the first MTP joint. Surgical wires overlying the first second and third metatarsals and first proximal phalanx. Widening of the joint space of the second PIP joint with possible prosthesis. Similar findings in the third PIP joint.  Correlate with prior history. IMPRESSION: Medial soft tissue swelling. Negative for acute osteomyelitis or fracture. Prior bunionectomy and surgery in the foot. Extensive cortical irregularity of the first metatarsal may be due to prior surgery or chronic osteomyelitis. Electronically Signed   By: Franchot Gallo M.D.   On: 03/07/2021 14:48    EKG: Independently reviewed.  Normal sinus rhythm PVCs.  Assessment/Plan Principal Problem:   Cellulitis of left foot Active Problems:   Cellulitis of foot    Bilateral foot swelling erythema concerning for cellulitis versus gout.  Given that patient has persistent symptoms despite prednisone and warm to touch with erythema and leukocytosis will treat as cellulitis for now with antibiotics.  Follow cultures we will get MRI. Hyperglycemia likely from recent use of steroids.  Check hemoglobin V6K follow metabolic panel. Elevated creatinine with normal GFR.  Follow metabolic panel.  Given the patient's symptoms of both feet having worsening swelling and concerning for cellulitis will need close monitoring and inpatient status.   DVT  prophylaxis: Lovenox. Code Status: Full code. Family Communication: Discussed with patient. Disposition Plan: Home. Consults called: None. Admission status: Inpatient.   Rise Patience MD [facility] Pager [phone].  If 7PM-7AM, please contact night-coverage [email or web address] Password [facility]  03/07/2021, 7:26 PM

## 2021-03-07 NOTE — ED Notes (Signed)
Patient is being discharged from the Urgent Care and sent to the Emergency Department via POV w/ wife.  . Per M. Ragan, FNP, patient is in need of higher level of care due to BLE edema & redness - need for cardiac labs. Patient is aware and verbalizes understanding of plan of care.  Vitals:   03/07/21 1201  BP: 132/66  Pulse: 66  Resp: 17  Temp: 99.7 F (37.6 C)  SpO2: 99%   Report called to [facility] ED - spoke w/ Roselyn Reef, RN

## 2021-03-07 NOTE — Discharge Instructions (Addendum)
Advised/instructed patient to go to [place] ED now for further evaluation and possible IV antibiotics.  Patient agreed and verbalized understanding of these instructions and this plan of care today.

## 2021-03-07 NOTE — ED Notes (Signed)
Report given to [facility]

## 2021-03-07 NOTE — ED Notes (Signed)
Report given to [facility]. 

## 2021-03-07 NOTE — ED Provider Notes (Signed)
[name]    CSN: [id number] Arrival date & time: 03/07/21  1049      History   Chief Complaint Chief Complaint  Patient presents with   Cellulitis    Left 3rd toe   Foot Swelling    bilateral    HPI Marc Krueger is a 65 y.o. male.   HPI 65 year old male presents with edema and swelling of both feet and left lower leg.  Patient reports daughter has been treating feet with clove essential oil paste since 02/23/2021 on the bottom of feet.  Patient was evaluated and treated here on 02/22/2021 for suspected chronic gout.  Treated with oral prednisone with serological testing performed.  Past Medical History:  Diagnosis Date   Allergy 11-23-1012   seasonal alllergy   Arthritis    osteoarthritis-knees   Sinusitis    occ./occ. 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 Authorizing Provider  acetaminophen (TYLENOL) 500 MG tablet Take 500 mg by mouth every 6 (six) hours as needed.    [provider]  predniSONE (DELTASONE) 20 MG tablet Take one tab by mouth twice daily for 4 days, then one daily for 3 days. Take with food. Patient not taking: Reported on 03/07/2021 02/23/21   Kandra Nicolas, MD    Family History Family History  Problem Relation Age of Onset   Arthritis Mother     Heart disease Mother    Diabetes Mother    Lung cancer Sister    Arthritis Brother    Diabetes Brother    Arthritis Brother     Social History Social History   Tobacco Use   Smoking status: Never   Smokeless tobacco: Never  Vaping Use   Vaping Use: Never used  Substance Use Topics   Alcohol use: Yes    Comment: 1 per week   Drug use: No     Allergies   Patient has no known allergies.   Review of Systems Review of Systems  Cardiovascular:        Bilateral pedal edema x 24 to 36 hours  Skin:  Positive for rash.  All other systems reviewed and are negative.   Physical Exam Triage Vital Signs ED Triage Vitals  Enc Vitals Group     BP 03/07/21 1201 132/66     Pulse Rate 03/07/21 1201 66     Resp 03/07/21 1201 17     Temp 03/07/21 1201 99.7 F (37.6 C)     Temp Source 03/07/21 1201 Oral     SpO2 03/07/21 1201 99 %     Weight 03/07/21 1203 235 lb 0.2 oz (106.6 kg)     Height --      Head Circumference --  Peak Flow --      Pain Score 03/07/21 1202 7     Pain Loc --      Pain Edu? --      Excl. in [place]? --    No data found.  Updated Vital Signs BP 132/66 (BP Location: Right Arm)   Pulse 66   Temp 99.7 F (37.6 C) (Oral)   Resp 17   Wt 235 lb 0.2 oz (106.6 kg)   SpO2 99%   BMI 32.78 kg/m    Physical Exam Vitals and nursing note reviewed.  Constitutional:      General: He is not in acute distress.    Appearance: Normal appearance. He is normal weight. He is ill-appearing.     Comments: Patient is seated comfortably in wheelchair today as he is unable to stand on feet due to bilateral foot pain  HENT:     Head: Normocephalic and atraumatic.     Nose: Nose normal.     Mouth/Throat:     Mouth: Mucous membranes are moist.     Pharynx: Oropharynx is clear.  Eyes:     Extraocular Movements: Extraocular movements intact.     Conjunctiva/sclera: Conjunctivae normal.     Pupils: Pupils are equal, round, and reactive to light.  Cardiovascular:      Rate and Rhythm: Normal rate and regular rhythm.     Heart sounds: Normal heart sounds.     Comments: Frequent ectopic beat noted, no S3-S4, PT/DP faint but present Pulmonary:     Effort: Pulmonary effort is normal.     Breath sounds: Normal breath sounds.     Comments: No adventitious breath sounds noted Musculoskeletal:        General: Normal range of motion.     Cervical back: Normal range of motion and neck supple.  Skin:    General: Skin is warm and dry.     Comments: Bilateral feet: Erythematous, fluctuant, indurated with moderate to significant soft tissue swelling noted  Neurological:     General: No focal deficit present.     Mental Status: He is alert and oriented to person, place, and time. Mental status is at baseline.  Psychiatric:        Mood and Affect: Mood normal.        Behavior: Behavior normal.        Thought Content: Thought content normal.     UC Treatments / Results  Labs (all labs ordered are listed, but only abnormal results are displayed) Labs Reviewed - No data to display  EKG   Radiology No results found.  Procedures Procedures (including critical care time)  Medications Ordered in UC Medications - No data to display  Initial Impression / Assessment and Plan / UC Course  I have reviewed the triage vital signs and the nursing notes.  Pertinent labs & imaging results that were available during my care of the patient were reviewed by me and considered in my medical decision making (see chart for details).     MDM: 1.  Fever; 2.  Pedal edema; 3.  Cellulitis of both feet. Advised/instructed patient to go to [place] ED now for further evaluation and possible IV antibiotics.  Patient agreed and verbalized understanding of these instructions and this plan of care today.  Patient discharged, hemodynamically stable. Final Clinical Impressions(s) / UC Diagnoses   Final diagnoses:  Fever, unspecified  Pedal edema  Cellulitis of both  feet     Discharge Instructions  Advised/instructed patient to go to [place] ED now for further evaluation and possible IV antibiotics.  Patient agreed and verbalized understanding of these instructions and this plan of care today.     ED Prescriptions   None    PDMP not reviewed this encounter.   Eliezer Lofts, [place] 03/07/21 1302

## 2021-03-07 NOTE — ED Notes (Signed)
Urine sent to lab 

## 2021-03-07 NOTE — ED Provider Notes (Signed)
[facility] Provider Note   CSN: [id number] Arrival date & time: 03/07/21  1300     History Chief Complaint  Patient presents with   Foot Pain    Marc Krueger is a 65 y.o. male past medical history significant for bilateral surgery on both feet for hammertoes, bunions years ago who reports with 2 weeks of worsening swelling in bilateral feet.  She was initially evaluated by urgent care, diagnosed with gout and put on a course of oral steroids.  Patient reports that since then the left toe has worsened, began to have purulent drainage.  Patient also has significant redness at the right MTP.  Patient reports that he has pain throughout the entirety of both feet.  Patient reports that he has had this isolated edema across both feet, worsening pain with standing.  Patient does report that he had a pedicure, with potential injury, memory bleed to the left foot around 1 month ago.  Patient has not taken any antibiotics.  Patient reports he has been having some fever, chills.  Patient denies history of heart failure, kidney failure.   Foot Pain Pertinent negatives include no chest pain and no shortness of breath.      Past Medical History:  Diagnosis Date   Allergy [id number]   seasonal alllergy   Arthritis    osteoarthritis-knees   Sinusitis    occ./occ. ringing in ears    Patient Active Problem List   Diagnosis Date Noted   Cellulitis of left foot 03/07/2021   Pulmonary nodules 08/16/2013   Chronic cough 05/10/2013   Expected blood loss anemia 12/08/2012   Obese 12/08/2012   S/P left TKA 12/07/2012   Obesity (BMI 30.0-34.9) 03/19/2012   Post-nasal drip 03/19/2012   Osteoarthritis of both knees 03/19/2012    Past Surgical History:  Procedure Laterality Date   foot sugery Bilateral 1980   JOINT REPLACEMENT     LASIK     TOTAL KNEE ARTHROPLASTY Left 12/07/2012   Procedure: LEFT TOTAL KNEE ARTHROPLASTY;  Surgeon: Mauri Pole, MD;  Location:  [facility];  Service: Orthopedics;  Laterality: Left;       Family History  Problem Relation Age of Onset   Arthritis Mother    Heart disease Mother    Diabetes Mother    Lung cancer Sister    Arthritis Brother    Diabetes Brother    Arthritis Brother     Social History   Tobacco Use   Smoking status: Never   Smokeless tobacco: Never  Vaping Use   Vaping Use: Never used  Substance Use Topics   Alcohol use: Yes    Comment: 1 per week   Drug use: No    Home Medications Prior to Admission medications   Medication Sig Start Date End Date Taking? Authorizing Provider  acetaminophen (TYLENOL) 500 MG tablet Take 500 mg by mouth every 6 (six) hours as needed.    [provider]  predniSONE (DELTASONE) 20 MG tablet Take one tab by mouth twice daily for 4 days, then one daily for 3 days. Take with food. Patient not taking: No sig reported 02/23/21   Kandra Nicolas, MD    Allergies    Patient has no known allergies.  Review of Systems   Review of Systems  Constitutional:  Positive for chills and fever.  Respiratory:  Negative for chest tightness and shortness of breath.   Cardiovascular:  Negative for chest pain.  All other systems reviewed and  are negative.  Physical Exam Updated Vital Signs BP (!) 145/66   Pulse 97   Temp 99.5 F (37.5 C) (Oral)   Resp 14   Ht $R'5\' 11"'Pk$  (1.803 m)   Wt 106.6 kg   SpO2 100%   BMI 32.78 kg/m   Physical Exam Vitals and nursing note reviewed.  Constitutional:      General: He is not in acute distress.    Appearance: Normal appearance.  HENT:     Head: Normocephalic and atraumatic.  Eyes:     General:        Right eye: No discharge.        Left eye: No discharge.  Cardiovascular:     Rate and Rhythm: Normal rate and regular rhythm.     Heart sounds: No murmur heard.   No friction rub. No gallop.  Pulmonary:     Effort: Pulmonary effort is normal.     Breath sounds: Normal breath sounds.  Abdominal:     General: Bowel  sounds are normal.     Palpations: Abdomen is soft.  Skin:    General: Skin is warm and dry.     Capillary Refill: Capillary refill takes less than 2 seconds.     Comments: Redness, swelling, purulent drainage, and notable pore on the dorsal surface of the left third toe.  Redness surrounding the MTP of the right great toe.  Bilateral edema to both feet.  Tenderness to palpation throughout feet.  Onychomycosis to all visible nails.  Neurological:     Mental Status: He is alert and oriented to person, place, and time.  Psychiatric:        Mood and Affect: Mood normal.        Behavior: Behavior normal.    ED Results / Procedures / Treatments   Labs (all labs ordered are listed, but only abnormal results are displayed) Labs Reviewed  CBC WITH DIFFERENTIAL/PLATELET - Abnormal; Notable for the following components:      Result Value   WBC 20.6 (*)    Neutro Abs 17.3 (*)    Monocytes Absolute 2.1 (*)    Abs Immature Granulocytes 0.21 (*)    All other components within normal limits  SEDIMENTATION RATE - Abnormal; Notable for the following components:   Sed Rate 22 (*)    All other components within normal limits  COMPREHENSIVE METABOLIC PANEL - Abnormal; Notable for the following components:   Sodium 133 (*)    Chloride 97 (*)    Glucose, Bld 187 (*)    Creatinine, Ser 1.30 (*)    Total Bilirubin 1.7 (*)    All other components within normal limits  CULTURE, BLOOD (ROUTINE X 2)  CULTURE, BLOOD (ROUTINE X 2)  RESP PANEL BY RT-PCR (FLU A&B, COVID) ARPGX2  URIC ACID  C-REACTIVE PROTEIN  BRAIN NATRIURETIC PEPTIDE  URINALYSIS, ROUTINE W REFLEX MICROSCOPIC    EKG None  Radiology DG Chest 2 View  Result Date: 03/07/2021 CLINICAL DATA:  Lower extremity edema. EXAM: CHEST - 2 VIEW COMPARISON:  Chest x-ray 11/26/2018, CT chest 10/10/2014 FINDINGS: The heart and mediastinal contours are within normal limits. No focal consolidation. No pulmonary edema. No pleural effusion. No  pneumothorax. No acute osseous abnormality. IMPRESSION: No active cardiopulmonary disease. Electronically Signed   By: Iven Finn M.D.   On: 03/07/2021 16:00   DG Foot Complete Left  Result Date: 03/07/2021 CLINICAL DATA:  Toe pain.  Foot swelling. EXAM: LEFT FOOT - COMPLETE 3+ VIEW COMPARISON:  02/22/21 FINDINGS: There is mild diffuse osteopenia. Postoperative changes from previous repair of hallux valgus deformity noted. Chronic deformities involving the distal aspect of the first, second, third, and fifth metatarsal bones appears stable from the previous exam. Arthro pathic changes are noted involving the PIP joints of the second, third, fourth and fifth digits. There are no signs of acute fracture or dislocation. No focal bone erosions to suggest osteomyelitis. Dorsal soft tissue swelling is identified overlying the metatarsal bones. IMPRESSION: 1. No acute bone abnormality. 2. Chronic deformities involve the first, second, third, and fifth metatarsal bones. 3. Dorsal soft tissue swelling. Electronically Signed   By: Kerby Moors M.D.   On: 03/07/2021 14:46   DG Foot Complete Right  Result Date: 03/07/2021 CLINICAL DATA:  Toe pain.  Cellulitis EXAM: RIGHT FOOT COMPLETE - 3+ VIEW COMPARISON:  None. FINDINGS: Soft tissue swelling medial foot. Negative for fracture or osteomyelitis Prior foot surgery. Prior bunionectomy. There is extensive irregularity of the first metatarsal which could be due to chronic osteomyelitis which has healed. Moderate degenerative change in spurring in the first MTP joint. Surgical wires overlying the first second and third metatarsals and first proximal phalanx. Widening of the joint space of the second PIP joint with possible prosthesis. Similar findings in the third PIP joint. Correlate with prior history. IMPRESSION: Medial soft tissue swelling. Negative for acute osteomyelitis or fracture. Prior bunionectomy and surgery in the foot. Extensive cortical irregularity of  the first metatarsal may be due to prior surgery or chronic osteomyelitis. Electronically Signed   By: Franchot Gallo M.D.   On: 03/07/2021 14:48    Procedures Procedures   Medications Ordered in ED Medications  piperacillin-tazobactam (ZOSYN) IVPB 3.375 g (0 g Intravenous Stopped 03/07/21 1611)  morphine 2 MG/ML injection 2 mg (2 mg Intravenous Given 03/07/21 1609)    ED Course  I have reviewed the triage vital signs and the nursing notes.  Pertinent labs & imaging results that were available during my care of the patient were reviewed by me and considered in my medical decision making (see chart for details).  Clinical Course as of 03/07/21 1613  Thu Mar 07, 3468  239 65 year old male here with right and left foot swelling and pain that is been going on for few weeks.  He was put on a course of steroids which may have helped transiently.  No clear fever but he feels chilled.  Left third toe looks infected and there is overlying erythema and swelling of the dorsum of his foot.  Right foot erythema and tenderness at the MTP extending over his foot also.  Getting labs cultures x-ray and likely will need admission. [MB]    Clinical Course User Index [MB] Hayden Rasmussen, MD   MDM Rules/Calculators/A&P                         I discussed this case with my attending physician who cosigned this note including patient's presenting symptoms, physical exam, and planned diagnostics and interventions. Attending physician stated agreement with plan or made changes to plan which were implemented.   Attending physician assessed patient at bedside.  Left third toe with clinical appearance of infection, purulent drainage.  Right great toe MTP clinical appearance of gout versus cellulitis versus intra-articular infection.  Patient with generalized edema, tenderness, swelling of bilateral feet.  X-ray showsquestionable chronic osteo vs. Post-surgical changes on the right foot. Patient with borderline  fever and chills, patient with significant  pain to bilateral lower feet.  Initial lab work-up significant for white blood cell count of 20.6 with left shift, some elevation expected given recent course of steroids, however with systemic symptoms of chills, fever also consistent with infection.  ESR elevated at 22.  No formal diagnosis of diabetes today, however he does have elevated blood glucose today.  We will start IV antibiotics with coverage for diabetic foot infection.  Lower extremity edema favored to be secondary to infection, however patient with some concern for his heart as he was told "something" was abnormal when he was seen in urgent care.  Will evaluate BNP, EKG, chest x-ray, UA.  EKG is remarkable for PVCs, PACs.  Chest x-ray without abnormality.  Will seek admission for IV antibiotics, plus or minus MRI to further evaluate for possible osteomyelitis.  Discussed admission with hospitalist who does agree to admission at this time. Final Clinical Impression(s) / ED Diagnoses Final diagnoses:  None    Rx / DC Orders ED Discharge Orders     None        Dorien Chihuahua 03/07/21 1613    Hayden Rasmussen, MD 03/07/21 1749

## 2021-03-07 NOTE — ED Triage Notes (Signed)
Pain and swelling to both of his feet. He was sent here from [facility] for cellulitis.

## 2021-03-07 NOTE — ED Triage Notes (Addendum)
Left 3rd toe more swollen  Swelling did go down with steroids  Pt's daughter has been treating w/ clove essential oil paste since 02/23/21 on the bottom of feet Edema & swelling noted to both feet & left lower leg No OTC meds

## 2021-03-08 DIAGNOSIS — A419 Sepsis, unspecified organism: Secondary | ICD-10-CM

## 2021-03-08 DIAGNOSIS — E871 Hypo-osmolality and hyponatremia: Secondary | ICD-10-CM

## 2021-03-08 DIAGNOSIS — M25461 Effusion, right knee: Secondary | ICD-10-CM

## 2021-03-08 HISTORY — DX: Effusion, right knee: M25.461

## 2021-03-08 HISTORY — DX: Hypo-osmolality and hyponatremia: E87.1

## 2021-03-08 LAB — BASIC METABOLIC PANEL
Anion gap: 9 (ref 5–15)
BUN: 18 mg/dL (ref 8–23)
CO2: 22 mmol/L (ref 22–32)
Calcium: 8.4 mg/dL — ABNORMAL LOW (ref 8.9–10.3)
Chloride: 98 mmol/L (ref 98–111)
Creatinine, Ser: 1.05 mg/dL (ref 0.61–1.24)
GFR, Estimated: >60 mL/min (ref >60)
Glucose, Bld: 213 mg/dL — ABNORMAL HIGH (ref 70–99)
Potassium: 4 mmol/L (ref 3.5–5.1)
Sodium: 129 mmol/L — ABNORMAL LOW (ref 135–145)

## 2021-03-08 LAB — CBC
HCT: 38 % — ABNORMAL LOW (ref 39.0–52.0)
Hemoglobin: 13.3 g/dL (ref 13.0–17.0)
MCH: 31.9 pg (ref 26.0–34.0)
MCHC: 35 g/dL (ref 30.0–36.0)
MCV: 91.1 fL (ref 80.0–100.0)
Platelets: 286 10*3/uL (ref 150–400)
RBC: 4.17 MIL/uL — ABNORMAL LOW (ref 4.22–5.81)
RDW: 13.2 % (ref 11.5–15.5)
WBC: 18.2 10*3/uL — ABNORMAL HIGH (ref 4.0–10.5)
nRBC: 0 % (ref 0.0–0.2)

## 2021-03-08 LAB — GLUCOSE, CAPILLARY
Glucose-Capillary: 166 mg/dL — ABNORMAL HIGH (ref 70–99)
Glucose-Capillary: 144 mg/dL — ABNORMAL HIGH (ref 70–99)
Glucose-Capillary: 145 mg/dL — ABNORMAL HIGH (ref 70–99)
Glucose-Capillary: 197 mg/dL — ABNORMAL HIGH (ref 70–99)

## 2021-03-08 LAB — SYNOVIAL CELL COUNT + DIFF, W/ CRYSTALS
Eosinophils-Synovial: 0 % (ref 0–1)
Lymphocytes-Synovial Fld: 0 % (ref 0–20)
Monocyte-Macrophage-Synovial Fluid: 6 % — ABNORMAL LOW (ref 50–90)
Neutrophil, Synovial: 94 % — ABNORMAL HIGH (ref 0–25)
WBC, Synovial: UNDETERMINED /mm3 (ref 0–200)

## 2021-03-08 LAB — HIV ANTIBODY (ROUTINE TESTING W REFLEX): HIV Screen 4th Generation wRfx: NONREACTIVE

## 2021-03-08 LAB — HEMOGLOBIN A1C
Hgb A1c MFr Bld: 6.4 % — ABNORMAL HIGH (ref 4.8–5.6)
Mean Plasma Glucose: 136.98 mg/dL

## 2021-03-08 MED ORDER — LIDOCAINE HCL 2 % IJ SOLN
10.0000 mL | Freq: Once | INTRAMUSCULAR | Status: AC
  Administered 2021-03-08: 200 mg

## 2021-03-08 MED ORDER — OXYCODONE HCL 5 MG PO TABS
5.0000 mg | ORAL_TABLET | ORAL | Status: DC | PRN
Start: 2021-03-08 — End: 2021-03-11
  Administered 2021-03-08 – 2021-03-09 (×4): 10 mg via ORAL
  Filled 2021-03-08 (×4): qty 2

## 2021-03-08 MED ORDER — METHYLPREDNISOLONE ACETATE 80 MG/ML IJ SUSP
80.0000 mg | Freq: Once | INTRAMUSCULAR | Status: AC
  Administered 2021-03-08: 80 mg via INTRA_ARTICULAR
  Filled 2021-03-08: qty 1

## 2021-03-08 MED ORDER — LIDOCAINE HCL 2 % IJ SOLN
INTRAMUSCULAR | Status: AC
  Administered 2021-03-08: 200 mg
  Filled 2021-03-08: qty 20

## 2021-03-08 NOTE — Progress Notes (Signed)
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 Pt had already recently made changes to diet and activity and has lost 15 pounds. Encouraged follow up as he has not seen a physician in 7 years.   Thanks,  Tama Headings RN, MSN, BC-ADM Inpatient Diabetes Coordinator Team Pager [phone] (8a-5p)

## 2021-03-08 NOTE — Plan of Care (Signed)
Patient was able to move right leg better post drainage. Problem: Nutrition: Goal: Adequate nutrition will be maintained Outcome: Progressing

## 2021-03-08 NOTE — Progress Notes (Signed)
Marc Krueger  MRN: [id number] DOB/Age: 64-Jun-1957 64 y.o. [facility] Procedure: Intrarticular right knee injection     Subjective: Spoke with lab and cell count unable to be performed but INTRACELLULAR MONOSODIUM URATE CRYSTALS noted on fluid analysis  Vital Signs Temp:  [98.7 F (37.1 C)-101.2 F (38.4 C)] 100 F (37.8 C) (10/14 1207) Pulse Rate:  [83-94] 86 (10/14 1207) Resp:  [16-20] 16 (10/14 1207) BP: (122-147)/(45-73) 122/73 (10/14 1207) SpO2:  [96 %-97 %] 97 % (10/14 1207)  Lab Results Recent Labs    03/07/21 1348 03/08/21 0442  WBC 20.6* 18.2*  HGB 14.3 13.3  HCT 41.5 38.0*  PLT 366 286   BMET Recent Labs    03/07/21 1348 03/08/21 0442  NA 133* 129*  K 4.6 4.0  CL 97* 98  CO2 25 22  GLUCOSE 187* 213*  BUN 21 18  CREATININE 1.30* 1.05  CALCIUM 8.9 8.4*   INR  Date Value Ref Range Status  12/02/2012 1.01 0.00 - 1.49 Final     Exam Right knee still swollen and held in flexed position          A/Plan 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 We have discussed need for primary care establishment to establish ongoing treatment. The injection can certainly cause a transient elevation of his blood sugars. Provided his MRI scans of his feet do not show ant osteomyelitis, no further orthopedic needs identified. Please call for any additional concerns and he can follow up with Dr. Alvan Dame for ongoing management if needed    [facility] PA-C  03/08/2021, 6:39 PM Contact # [phone]

## 2021-03-08 NOTE — Consult Note (Addendum)
Reason for Consult: Right knee effusion rule out septic joint HPI: Marc Krueger is an 65 y.o. male who was admitted to the hospital yesterday following a urgent care visit for increasing pain in his feet and ankles.  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  He has had previous history of multiple foot surgeries from resulting injuries as a young man.  He denied any particular fevers and has not had any erythema however upon admission to urgent care he was noted to have significant foot pain and swelling.  He was admitted for concern of cellulitis.  Over the past 24 hours he has had increasing right knee pain and swelling.  Now his right knee pain severely outweighs his foot and ankle pain.  He is a previous patient of our practice and has undergone a left total knee arthroplasty for degenerative arthritis.  He does have known arthritis about the right knee but has never had any particular surgeries or treatment for such.  He denies any recent injuries but states that he has been on his feet a lot lately.  He denies history of gout.  He also denies history of diabetes however there has been recent laboratory data to suggest he is "prediabetic."  Past Medical History:  Diagnosis Date   Allergy [id number]   seasonal alllergy   Arthritis    osteoarthritis-knees   Sinusitis    occ./occ. 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   Procedure: LEFT TOTAL KNEE ARTHROPLASTY;  Surgeon: Mauri Pole, MD;  Location: [facility];  Service: Orthopedics;  Laterality: Left;    Family History  Problem Relation Age of Onset   Arthritis Mother    Heart disease Mother    Diabetes Mother    Lung cancer Sister    Arthritis Brother    Diabetes Brother    Arthritis Brother     Social History:  reports that he has never smoked. He has  never used smokeless tobacco. He reports current alcohol use. He reports that he does not use drugs.  Allergies: No Known Allergies  Medications: I have reviewed the patient's current medications.  Results for orders placed or performed during the hospital encounter of 03/07/21 (from the past 48 hour(s))  CBC with Differential     Status: Abnormal   Collection Time: 03/07/21  1:48 PM  Result Value Ref Range   WBC 20.6 (H) 4.0 - 10.5 K/uL   RBC 4.58 4.22 - 5.81 MIL/uL   Hemoglobin 14.3 13.0 - 17.0 g/dL   HCT 41.5 39.0 - 52.0 %   MCV 90.6 80.0 - 100.0 fL   MCH 31.2 26.0 - 34.0 pg   MCHC 34.5 30.0 - 36.0 g/dL   RDW 12.9 11.5 - 15.5 %   Platelets 366 150 - 400 K/uL   nRBC 0.0 0.0 - 0.2 %   Neutrophils Relative % 84 %   Neutro Abs 17.3 (H) 1.7 - 7.7 K/uL   Lymphocytes Relative 5 %   Lymphs Abs 1.0 0.7 - 4.0 K/uL   Monocytes Relative 10 %   Monocytes Absolute 2.1 (H) 0.1 - 1.0 K/uL   Eosinophils Relative 0 %   Eosinophils Absolute 0.0 0.0 - 0.5 K/uL   Basophils Relative 0 %   Basophils  Absolute 0.1 0.0 - 0.1 K/uL   Immature Granulocytes 1 %   Abs Immature Granulocytes 0.21 (H) 0.00 - 0.07 K/uL    Comment: Performed at [facility], [address] [place], [place] [address]  Uric acid     Status: None   Collection Time: 03/07/21  1:48 PM  Result Value Ref Range   Uric Acid, Serum 7.0 3.7 - 8.6 mg/dL    Comment: Performed at [facility], [address] [place], [place] [address]  Sedimentation rate     Status: Abnormal   Collection Time: 03/07/21  1:48 PM  Result Value Ref Range   Sed Rate 22 (H) 0 - 16 mm/hr    Comment: Performed at [facility], [address] [place], [place] [address]  C-reactive protein     Status: Abnormal   Collection Time: 03/07/21  1:48 PM  Result Value Ref Range   CRP 18.6 (H) <1.0 mg/dL    Comment: Performed at [facility], [place] [address], [place], [place] [address]  Comprehensive metabolic panel      Status: Abnormal   Collection Time: 03/07/21  1:48 PM  Result Value Ref Range   Sodium 133 (L) 135 - 145 mmol/L   Potassium 4.6 3.5 - 5.1 mmol/L   Chloride 97 (L) 98 - 111 mmol/L   CO2 25 22 - 32 mmol/L   Glucose, Bld 187 (H) 70 - 99 mg/dL    Comment: Glucose reference range applies only to samples taken after fasting for at least 8 hours.   BUN 21 8 - 23 mg/dL   Creatinine, Ser 1.30 (H) 0.61 - 1.24 mg/dL   Calcium 8.9 8.9 - 10.3 mg/dL   Total Protein 7.7 6.5 - 8.1 g/dL   Albumin 3.8 3.5 - 5.0 g/dL   AST 16 15 - 41 U/L   ALT 17 0 - 44 U/L   Alkaline Phosphatase 82 38 - 126 U/L   Total Bilirubin 1.7 (H) 0.3 - 1.2 mg/dL   GFR, Estimated >60 >60 mL/min    Comment: (NOTE) Calculated using the CKD-EPI Creatinine Equation (2021)    Anion gap 11 5 - 15    Comment: Performed at [facility], [address], [place], [place] [address]  Brain natriuretic peptide     Status: Abnormal   Collection Time: 03/07/21  1:48 PM  Result Value Ref Range   B Natriuretic Peptide 217.7 (H) 0.0 - 100.0 pg/mL    Comment: Performed at [facility], [address] [place], [place] [address]  Blood culture (routine x 2)     Status: None (Preliminary result)   Collection Time: 03/07/21  2:02 PM   Specimen: Right Antecubital; Blood  Result Value Ref Range   Specimen Description      RIGHT ANTECUBITAL Performed at [facility], [address] [place], [place] [address]    Special Requests      BOTTLES DRAWN AEROBIC AND ANAEROBIC Blood Culture adequate volume Performed at [facility], [address] [place], [place] [address]    Culture      NO GROWTH < 24 HOURS Performed at [facility], [place] [address], [place], [place] [address]    Report Status PENDING   Blood culture (routine x 2)     Status: None (Preliminary result)   Collection Time: 03/07/21  2:11 PM   Specimen:  Left Antecubital; Blood  Result Value Ref Range   Specimen Description       LEFT ANTECUBITAL Performed at [facility], [address] [place], [place] [address]    Special Requests      BOTTLES DRAWN AEROBIC AND ANAEROBIC Blood Culture adequate volume Performed at [facility], [address] [place], [place] [address]    Culture      NO GROWTH < 24 HOURS Performed at [facility], [place] [address], [place], [place] [address]    Report Status PENDING   Resp Panel by RT-PCR (Flu A&B, Covid) Nasopharyngeal Swab     Status: None   Collection Time: 03/07/21  3:35 PM   Specimen: Nasopharyngeal Swab; Nasopharyngeal(NP) swabs in vial transport medium  Result Value Ref Range   SARS Coronavirus 2 by RT PCR NEGATIVE NEGATIVE    Comment: (NOTE) SARS-CoV-2 target nucleic acids are NOT DETECTED.  The SARS-CoV-2 RNA is generally detectable in upper respiratory specimens during the acute phase of infection. The lowest concentration of SARS-CoV-2 viral copies this assay can detect is 138 copies/mL. A negative result does not preclude SARS-Cov-2 infection and should not be used as the sole basis for treatment or other patient management decisions. A negative result may occur with  improper specimen collection/handling, submission of specimen other than nasopharyngeal swab, presence of viral mutation(s) within the areas targeted by this assay, and inadequate number of viral copies(<138 copies/mL). A negative result must be combined with clinical observations, patient history, and epidemiological information. The expected result is Negative.  Fact Sheet for Patients:  EntrepreneurPulse.com.au  Fact Sheet for Healthcare Providers:  IncredibleEmployment.be  This test is no t yet approved or cleared by the [place] FDA and  has been authorized for detection and/or diagnosis of SARS-CoV-2 by FDA under an Emergency Use Authorization (EUA). This EUA will remain  in effect (meaning this test can be used)  for the duration of the COVID-19 declaration under Section 564(b)(1) of the Act, 21 U.S.C.section 360bbb-3(b)(1), unless the authorization is terminated  or revoked sooner.       Influenza A by PCR NEGATIVE NEGATIVE   Influenza B by PCR NEGATIVE NEGATIVE    Comment: (NOTE) The Xpert Xpress SARS-CoV-2/FLU/RSV plus assay is intended as an aid in the diagnosis of influenza from Nasopharyngeal swab specimens and should not be used as a sole basis for treatment. Nasal washings and aspirates are unacceptable for Xpert Xpress SARS-CoV-2/FLU/RSV testing.  Fact Sheet for Patients: EntrepreneurPulse.com.au  Fact Sheet for Healthcare Providers: IncredibleEmployment.be  This test is not yet approved or cleared by the [place] FDA and has been authorized for detection and/or diagnosis of SARS-CoV-2 by FDA under an Emergency Use Authorization (EUA). This EUA will remain in effect (meaning this test can be used) for the duration of the COVID-19 declaration under Section 564(b)(1) of the Act, 21 U.S.C. section 360bbb-3(b)(1), unless the authorization is terminated or revoked.  Performed at [facility], [address] [place], [place] [address]   Urinalysis, Routine w reflex microscopic Urine, Clean Catch     Status: Abnormal   Collection Time: 03/07/21  4:16 PM  Result Value Ref Range   Color, Urine AMBER (A) YELLOW    Comment: BIOCHEMICALS MAY BE AFFECTED BY COLOR   APPearance CLEAR CLEAR   Specific Gravity, Urine 1.025 1.005 - 1.030   pH 5.5 5.0 - 8.0   Glucose, UA 100 (A) NEGATIVE mg/dL  Hgb urine dipstick SMALL (A) NEGATIVE   Bilirubin Urine NEGATIVE NEGATIVE   Ketones, ur NEGATIVE NEGATIVE mg/dL   Protein, ur NEGATIVE NEGATIVE mg/dL   Nitrite NEGATIVE NEGATIVE   Leukocytes,Ua NEGATIVE NEGATIVE    Comment: Performed at [facility], [address] [place], [place] [address]  Urinalysis, Microscopic (reflex)      Status: Abnormal   Collection Time: 03/07/21  4:16 PM  Result Value Ref Range   RBC / HPF 0-5 0 - 5 RBC/hpf   WBC, UA NONE SEEN 0 - 5 WBC/hpf   Bacteria, UA RARE (A) NONE SEEN   Squamous Epithelial / LPF 0-5 0 - 5    Comment: Performed at [facility], [address] [place], [place] [address]  Glucose, capillary     Status: Abnormal   Collection Time: 03/07/21  8:28 PM  Result Value Ref Range   Glucose-Capillary 299 (H) 70 - 99 mg/dL    Comment: Glucose reference range applies only to samples taken after fasting for at least 8 hours.  Hemoglobin A1c     Status: Abnormal   Collection Time: 03/08/21  4:42 AM  Result Value Ref Range   Hgb A1c MFr Bld 6.4 (H) 4.8 - 5.6 %    Comment: (NOTE) Pre diabetes:          5.7%-6.4%  Diabetes:              >6.4%  Glycemic control for   <7.0% adults with diabetes    Mean Plasma Glucose 136.98 mg/dL    Comment: Performed at [place] [address], [place], [place] [address]  HIV Antibody (routine testing w rflx)     Status: None   Collection Time: 03/08/21  4:42 AM  Result Value Ref Range   HIV Screen 4th Generation wRfx Non Reactive Non Reactive    Comment: Performed at [facility], [place] [address], [place], [place] [address]  Basic metabolic panel     Status: Abnormal   Collection Time: 03/08/21  4:42 AM  Result Value Ref Range   Sodium 129 (L) 135 - 145 mmol/L   Potassium 4.0 3.5 - 5.1 mmol/L   Chloride 98 98 - 111 mmol/L   CO2 22 22 - 32 mmol/L   Glucose, Bld 213 (H) 70 - 99 mg/dL    Comment: Glucose reference range applies only to samples taken after fasting for at least 8 hours.   BUN 18 8 - 23 mg/dL   Creatinine, Ser 1.05 0.61 - 1.24 mg/dL   Calcium 8.4 (L) 8.9 - 10.3 mg/dL   GFR, Estimated >60 >60 mL/min    Comment: (NOTE) Calculated using the CKD-EPI Creatinine Equation (2021)    Anion gap 9 5 - 15    Comment: Performed at [facility], [place] [address] [place], [place]  [address]  CBC     Status: Abnormal   Collection Time: 03/08/21  4:42 AM  Result Value Ref Range   WBC 18.2 (H) 4.0 - 10.5 K/uL   RBC 4.17 (L) 4.22 - 5.81 MIL/uL   Hemoglobin 13.3 13.0 - 17.0 g/dL   HCT 38.0 (L) 39.0 - 52.0 %   MCV 91.1 80.0 - 100.0 fL   MCH 31.9 26.0 - 34.0 pg   MCHC 35.0 30.0 - 36.0 g/dL   RDW 13.2 11.5 - 15.5 %   Platelets 286 150 - 400 K/uL   nRBC 0.0 0.0 - 0.2 %  Comment: Performed at [facility], [place] [address], [place], [place] [address]  Glucose, capillary     Status: Abnormal   Collection Time: 03/08/21  7:49 AM  Result Value Ref Range   Glucose-Capillary 166 (H) 70 - 99 mg/dL    Comment: Glucose reference range applies only to samples taken after fasting for at least 8 hours.  Glucose, capillary     Status: Abnormal   Collection Time: 03/08/21 12:06 PM  Result Value Ref Range   Glucose-Capillary 144 (H) 70 - 99 mg/dL    Comment: Glucose reference range applies only to samples taken after fasting for at least 8 hours.    DG Chest 2 View  Result Date: 03/07/2021 CLINICAL DATA:  Lower extremity edema. EXAM: CHEST - 2 VIEW COMPARISON:  Chest x-ray 11/26/2018, CT chest 10/10/2014 FINDINGS: The heart and mediastinal contours are within normal limits. No focal consolidation. No pulmonary edema. No pleural effusion. No pneumothorax. No acute osseous abnormality. IMPRESSION: No active cardiopulmonary disease. Electronically Signed   By: Iven Finn M.D.   On: 03/07/2021 16:00   DG Foot Complete Left  Result Date: 03/07/2021 CLINICAL DATA:  Toe pain.  Foot swelling. EXAM: LEFT FOOT - COMPLETE 3+ VIEW COMPARISON:  02/22/21 FINDINGS: There is mild diffuse osteopenia. Postoperative changes from previous repair of hallux valgus deformity noted. Chronic deformities involving the distal aspect of the first, second, third, and fifth metatarsal bones appears stable from the previous exam. Arthro pathic changes are noted involving the PIP joints of the  second, third, fourth and fifth digits. There are no signs of acute fracture or dislocation. No focal bone erosions to suggest osteomyelitis. Dorsal soft tissue swelling is identified overlying the metatarsal bones. IMPRESSION: 1. No acute bone abnormality. 2. Chronic deformities involve the first, second, third, and fifth metatarsal bones. 3. Dorsal soft tissue swelling. Electronically Signed   By: Kerby Moors M.D.   On: 03/07/2021 14:46   DG Foot Complete Right  Result Date: 03/07/2021 CLINICAL DATA:  Toe pain.  Cellulitis EXAM: RIGHT FOOT COMPLETE - 3+ VIEW COMPARISON:  None. FINDINGS: Soft tissue swelling medial foot. Negative for fracture or osteomyelitis Prior foot surgery. Prior bunionectomy. There is extensive irregularity of the first metatarsal which could be due to chronic osteomyelitis which has healed. Moderate degenerative change in spurring in the first MTP joint. Surgical wires overlying the first second and third metatarsals and first proximal phalanx. Widening of the joint space of the second PIP joint with possible prosthesis. Similar findings in the third PIP joint. Correlate with prior history. IMPRESSION: Medial soft tissue swelling. Negative for acute osteomyelitis or fracture. Prior bunionectomy and surgery in the foot. Extensive cortical irregularity of the first metatarsal may be due to prior surgery or chronic osteomyelitis. Electronically Signed   By: Franchot Gallo M.D.   On: 03/07/2021 14:48    ROS: Patient denies any recent fevers chills or night sweats.  He does state that he did have some low-level temperatures overnight which were treated with acetaminophen.  He denies recent cough productive cough headaches fevers chills prior to his admission.  Physical Exam:   Mr. Allena Katz is a healthy-appearing 65 year old gentleman who is evaluated in his hospital bed.  He holds his right knee in a very guarded fashion is noted to have a tense effusion.  There is no obvious  erythema.  No streaking.  He has diffuse tenderness to palpation and pain is increased on attempts at range of motion.  He is  also noted to have some characteristic chronic swelling and edema about his feet and ankles.  He has well-healed surgical incisions about his toes from prior hammertoe deformity surgery.  While there is no obvious erythema there are some chronic changes of just chronic edema about his feet and ankles.  No cellulitis or streaking is noted today on exam. Vitals Temp:  [98.7 F (37.1 C)-101.2 F (38.4 C)] 100 F (37.8 C) (10/14 1207) Pulse Rate:  [47-97] 86 (10/14 1207) Resp:  [14-20] 16 (10/14 1207) BP: (122-147)/(45-75) 122/73 (10/14 1207) SpO2:  [96 %-100 %] 97 % (10/14 1207) Body mass index is 32.78 kg/m.  Assessment/Plan: Impression: Right knee pain and effusion with differential to include advanced degenerative arthritis versus gouty arthropathy versus a septic joint Treatment: His current examination does not have the appearance of a septic joint.  With his effusion however we have discussed obtaining an aspirate and sent into the lab for evaluation.  We discussed the risks and benefits and he wished to proceed.  Following a local anesthetic to the superior lateral aspect of his knee of 2% Xylocaine a joint aspirate was performed.  Approximately 50 cc of a yellow cloudy aspirate was obtained.  It did not have the look of a true septic joint but more consistent with a gouty arthropathy.  The aspirate was sent to the lab and I did put approximately 10 cc of plain Xylocaine back into the knee for a local analgesic effect.  We will follow along with the aspirates from the knee.  If it does appear to be more consistent with a gouty arthropathy he may certainly benefit from an intra-articular corticosteroid injection.  We will follow-up following the lab studies.  Olivia Mackie Shuford for Dr. Justice Britain 03/08/2021, 1:07 PM  Contact # [phone] Above note reviewed and in  complete agreement with assessment and plan.  Knee aspiration revealed evidence of monosodium urate crystals.  Plan for intra-articular corticosteroid injection for symptomatic relief.  Follow-up with Dr. Alvan Dame on an out patient basis as needed  Marin Shutter MD

## 2021-03-08 NOTE — Progress Notes (Signed)
PROGRESS NOTE    Marc Krueger  XTK:[id number] DOB: 1956-02-17 DOA: 03/07/2021 PCP: Pcp, No   Brief Narrative: Marc Krueger is a 65 y.o. male with no known medical history. Patient presented secondary to foot pain/swelling which has worsened over the last 3-4 weeks. Concern for cellulitis on admission with x-ray concerning for possible osteomyelitis. Ceftriaxone and Zosyn IV initiated. Blood cultures obtained.   Assessment & Plan:   Principal Problem:   Cellulitis of left foot Active Problems:   Cellulitis of foot   Cellulitis of bilateral feet Concern for possible osteomyelitis. Initial source appears to be left third toe. Associated fevers, chills, leukocytosis. CRP of 18.6 (down from 76.6 in September). Improving with IV antibiotics. MRI ordered and pending. Blood cultures pending -Follow-up MRI -Continue Ceftriaxone/Zosyn since there is improvement in presentation -Follow-up blood cultures  Right knee pain/swelling Concerning for infected joint but could also be a gout flare. -[facility] consulted for knee arthrocentesis with fluid analysis/culture -Analgesics prn -Antibiotics as mentioned above  Sepsis Did not meet criteria on admission but now has fever with associated leukocytosis. Blood cultures pending -Continue antibiotics as mentioned above -Follow-up blood cultures  Hyperglycemia Hemoglobin A1C of 6.4%. Patient has been working on diet and weight loss. He reports about a 15 lb intentional weight loss in the last month. He is not agreeable to considering metformin at this time. Will recommend continued diet/weight loss in addition to obtaining a PCP for continued monitoring/management.  Elevated creatinine Baseline creatinine of 1.1. Slightly elevated on admission but back to baseline  Hyponatremia Mild. Asymptomatic.  -CMP in AM   DVT prophylaxis: Lovenox Code Status:   Code Status: Full Code Family Communication: None at bedside Disposition Plan:  Discharge home likely in 3+ days pending possible transition to oral antibiotics, management of right knee disease   Consultants:  Orthopedic ([facility])  Procedures:  None  Antimicrobials: Ceftriaxone IV Zosyn IV    Subjective: Significant right knee pain. Feels better when flexed. Also with some right ankle pain. Feet erythema is improved from adimssion.  Objective: Vitals:   03/07/21 1938 03/07/21 2328 03/08/21 0334 03/08/21 0723  BP: 122/68 (!) 134/45 (!) 147/61 128/69  Pulse: 93 94 91 83  Resp: 20 20 20 18   Temp: 99.2 F (37.3 C) 99.9 F (37.7 C) (!) 101.2 F (38.4 C) 98.7 F (37.1 C)  TempSrc:  Oral  Oral  SpO2: 97% 96% 96% 96%  Weight:      Height:        Intake/Output Summary (Last 24 hours) at 03/08/2021 1047 Last data filed at 03/08/2021 1000 Gross per 24 hour  Intake 240 ml  Output 900 ml  Net -660 ml   Filed Weights   03/07/21 1305  Weight: 106.6 kg    Examination:  General exam: Appears calm and comfortable Respiratory system: Clear to auscultation. Respiratory effort normal. Cardiovascular system: S1 & S2 heard, RRR. No murmurs, rubs, gallops or clicks. Gastrointestinal system: Abdomen is nondistended, soft and nontender. No organomegaly or masses felt. Normal bowel sounds heard. Central nervous system: Alert and oriented. No focal neurological deficits. Musculoskeletal: Right knee with significant effusion and associated tenderness, especially laterally. Able to flex knee to about 90 degrees but unable to extend knee straight secondary to severe pain. Some right ankle effusion noted. Dorsum of right and left feet with some mild erythema. Left third to with rubor. 2+ bilateral DP pulses Skin: No cyanosis. No rashes Psychiatry: Judgement and insight appear normal. Mood & affect appropriate.  Data Reviewed: I have personally reviewed following labs and imaging studies  CBC Lab Results  Component Value Date   WBC 18.2 (H) 03/08/2021    RBC 4.17 (L) 03/08/2021   HGB 13.3 03/08/2021   HCT 38.0 (L) 03/08/2021   MCV 91.1 03/08/2021   MCH 31.9 03/08/2021   PLT 286 03/08/2021   MCHC 35.0 03/08/2021   RDW 13.2 03/08/2021   LYMPHSABS 1.0 03/07/2021   MONOABS 2.1 (H) 03/07/2021   EOSABS 0.0 03/07/2021   BASOSABS 0.1 45/80/9983     Last metabolic panel Lab Results  Component Value Date   NA 129 (L) 03/08/2021   K 4.0 03/08/2021   CL 98 03/08/2021   CO2 22 03/08/2021   BUN 18 03/08/2021   CREATININE 1.05 03/08/2021   GLUCOSE 213 (H) 03/08/2021   GFRNONAA >60 03/08/2021   GFRAA 86 (L) 12/09/2012   CALCIUM 8.4 (L) 03/08/2021   PROT 7.7 03/07/2021   ALBUMIN 3.8 03/07/2021   BILITOT 1.7 (H) 03/07/2021   ALKPHOS 82 03/07/2021   AST 16 03/07/2021   ALT 17 03/07/2021   ANIONGAP 9 03/08/2021    CBG (last 3)  Recent Labs    03/07/21 2028 03/08/21 0749  GLUCAP 299* 166*     GFR: Estimated Creatinine Clearance: 88.3 mL/min (by C-G formula based on SCr of 1.05 mg/dL).  Coagulation Profile: No results for input(s): INR, PROTIME in the last 168 hours.  Recent Results (from the past 240 hour(s))  Blood culture (routine x 2)     Status: None (Preliminary result)   Collection Time: 03/07/21  2:02 PM   Specimen: Right Antecubital; Blood  Result Value Ref Range Status   Specimen Description   Final    RIGHT ANTECUBITAL Performed at [facility], [address] [place], [place] [address]    Special Requests   Final    BOTTLES DRAWN AEROBIC AND ANAEROBIC Blood Culture adequate volume Performed at [facility], [address] [place], [place] [address]    Culture   Final    NO GROWTH < 24 HOURS Performed at [facility], [place] [address] [place], [place] [address]    Report Status PENDING  Incomplete  Blood culture (routine x 2)     Status: None (Preliminary result)   Collection Time: 03/07/21  2:11 PM   Specimen: Left Antecubital; Blood  Result Value Ref Range Status   Specimen  Description   Final    LEFT ANTECUBITAL Performed at [facility], [address] [place], [place] [address]    Special Requests   Final    BOTTLES DRAWN AEROBIC AND ANAEROBIC Blood Culture adequate volume Performed at [facility], [address] [place], [place] [address]    Culture   Final    NO GROWTH < 24 HOURS Performed at [facility], [place] [address] [place], [place] [address]    Report Status PENDING  Incomplete  Resp Panel by RT-PCR (Flu A&B, Covid) Nasopharyngeal Swab     Status: None   Collection Time: 03/07/21  3:35 PM   Specimen: Nasopharyngeal Swab; Nasopharyngeal(NP) swabs in vial transport medium  Result Value Ref Range Status   SARS Coronavirus 2 by RT PCR NEGATIVE NEGATIVE Final    Comment: (NOTE) SARS-CoV-2 target nucleic acids are NOT DETECTED.  The SARS-CoV-2 RNA is generally detectable in upper respiratory specimens during the acute phase of infection. The lowest concentration of SARS-CoV-2  viral copies this assay can detect is 138 copies/mL. A negative result does not preclude SARS-Cov-2 infection and should not be used as the sole basis for treatment or other patient management decisions. A negative result may occur with  improper specimen collection/handling, submission of specimen other than nasopharyngeal swab, presence of viral mutation(s) within the areas targeted by this assay, and inadequate number of viral copies(<138 copies/mL). A negative result must be combined with clinical observations, patient history, and epidemiological information. The expected result is Negative.  Fact Sheet for Patients:  EntrepreneurPulse.com.au  Fact Sheet for Healthcare Providers:  IncredibleEmployment.be  This test is no t yet approved or cleared by the [place] FDA and  has been authorized for detection and/or diagnosis of SARS-CoV-2 by FDA under an Emergency Use Authorization (EUA). This  EUA will remain  in effect (meaning this test can be used) for the duration of the COVID-19 declaration under Section 564(b)(1) of the Act, 21 U.S.C.section 360bbb-3(b)(1), unless the authorization is terminated  or revoked sooner.       Influenza A by PCR NEGATIVE NEGATIVE Final   Influenza B by PCR NEGATIVE NEGATIVE Final    Comment: (NOTE) The Xpert Xpress SARS-CoV-2/FLU/RSV plus assay is intended as an aid in the diagnosis of influenza from Nasopharyngeal swab specimens and should not be used as a sole basis for treatment. Nasal washings and aspirates are unacceptable for Xpert Xpress SARS-CoV-2/FLU/RSV testing.  Fact Sheet for Patients: EntrepreneurPulse.com.au  Fact Sheet for Healthcare Providers: IncredibleEmployment.be  This test is not yet approved or cleared by the [place] FDA and has been authorized for detection and/or diagnosis of SARS-CoV-2 by FDA under an Emergency Use Authorization (EUA). This EUA will remain in effect (meaning this test can be used) for the duration of the COVID-19 declaration under Section 564(b)(1) of the Act, 21 U.S.C. section 360bbb-3(b)(1), unless the authorization is terminated or revoked.  Performed at [facility], [address], [place], [place] [address]         Radiology Studies: DG Chest 2 View  Result Date: 03/07/2021 CLINICAL DATA:  Lower extremity edema. EXAM: CHEST - 2 VIEW COMPARISON:  Chest x-ray 11/26/2018, CT chest 10/10/2014 FINDINGS: The heart and mediastinal contours are within normal limits. No focal consolidation. No pulmonary edema. No pleural effusion. No pneumothorax. No acute osseous abnormality. IMPRESSION: No active cardiopulmonary disease. Electronically Signed   By: Iven Finn M.D.   On: 03/07/2021 16:00   DG Foot Complete Left  Result Date: 03/07/2021 CLINICAL DATA:  Toe pain.  Foot swelling. EXAM: LEFT FOOT - COMPLETE 3+ VIEW COMPARISON:   02/22/21 FINDINGS: There is mild diffuse osteopenia. Postoperative changes from previous repair of hallux valgus deformity noted. Chronic deformities involving the distal aspect of the first, second, third, and fifth metatarsal bones appears stable from the previous exam. Arthro pathic changes are noted involving the PIP joints of the second, third, fourth and fifth digits. There are no signs of acute fracture or dislocation. No focal bone erosions to suggest osteomyelitis. Dorsal soft tissue swelling is identified overlying the metatarsal bones. IMPRESSION: 1. No acute bone abnormality. 2. Chronic deformities involve the first, second, third, and fifth metatarsal bones. 3. Dorsal soft tissue swelling. Electronically Signed   By: Kerby Moors M.D.   On: 03/07/2021 14:46   DG Foot Complete Right  Result Date: 03/07/2021 CLINICAL DATA:  Toe pain.  Cellulitis EXAM: RIGHT FOOT COMPLETE - 3+ VIEW COMPARISON:  None. FINDINGS: Soft tissue swelling medial foot.  Negative for fracture or osteomyelitis Prior foot surgery. Prior bunionectomy. There is extensive irregularity of the first metatarsal which could be due to chronic osteomyelitis which has healed. Moderate degenerative change in spurring in the first MTP joint. Surgical wires overlying the first second and third metatarsals and first proximal phalanx. Widening of the joint space of the second PIP joint with possible prosthesis. Similar findings in the third PIP joint. Correlate with prior history. IMPRESSION: Medial soft tissue swelling. Negative for acute osteomyelitis or fracture. Prior bunionectomy and surgery in the foot. Extensive cortical irregularity of the first metatarsal may be due to prior surgery or chronic osteomyelitis. Electronically Signed   By: Franchot Gallo M.D.   On: 03/07/2021 14:48      Scheduled Meds:  enoxaparin (LOVENOX) injection  50 mg Subcutaneous Q24H   Continuous Infusions:  cefTRIAXone (ROCEPHIN)  IV 2 g (03/07/21 2107)      LOS: 1 day     Cordelia Poche, MD [facility] 03/08/2021, 10:47 AM  If 7PM-7AM, please contact night-coverage [email or web address]

## 2021-03-09 ENCOUNTER — Inpatient Hospital Stay (HOSPITAL_COMMUNITY): Payer: Self-pay

## 2021-03-09 DIAGNOSIS — M869 Osteomyelitis, unspecified: Secondary | ICD-10-CM

## 2021-03-09 LAB — CBC
HCT: 41.9 % (ref 39.0–52.0)
Hemoglobin: 13.9 g/dL (ref 13.0–17.0)
MCH: 31.1 pg (ref 26.0–34.0)
MCHC: 33.2 g/dL (ref 30.0–36.0)
MCV: 93.7 fL (ref 80.0–100.0)
Platelets: 300 10*3/uL (ref 150–400)
RBC: 4.47 MIL/uL (ref 4.22–5.81)
RDW: 13 % (ref 11.5–15.5)
WBC: 16.9 10*3/uL — ABNORMAL HIGH (ref 4.0–10.5)
nRBC: 0 % (ref 0.0–0.2)

## 2021-03-09 LAB — COMPREHENSIVE METABOLIC PANEL
ALT: 17 U/L (ref 0–44)
AST: 15 U/L (ref 15–41)
Albumin: 3.3 g/dL — ABNORMAL LOW (ref 3.5–5.0)
Alkaline Phosphatase: 78 U/L (ref 38–126)
Anion gap: 10 (ref 5–15)
BUN: 21 mg/dL (ref 8–23)
CO2: 24 mmol/L (ref 22–32)
Calcium: 8.9 mg/dL (ref 8.9–10.3)
Chloride: 99 mmol/L (ref 98–111)
Creatinine, Ser: 1.28 mg/dL — ABNORMAL HIGH (ref 0.61–1.24)
GFR, Estimated: >60 mL/min (ref >60)
Glucose, Bld: 208 mg/dL — ABNORMAL HIGH (ref 70–99)
Potassium: 4.7 mmol/L (ref 3.5–5.1)
Sodium: 133 mmol/L — ABNORMAL LOW (ref 135–145)
Total Bilirubin: 1 mg/dL (ref 0.3–1.2)
Total Protein: 7.3 g/dL (ref 6.5–8.1)

## 2021-03-09 LAB — GLUCOSE, CAPILLARY
Glucose-Capillary: 180 mg/dL — ABNORMAL HIGH (ref 70–99)
Glucose-Capillary: 342 mg/dL — ABNORMAL HIGH (ref 70–99)
Glucose-Capillary: 174 mg/dL — ABNORMAL HIGH (ref 70–99)
Glucose-Capillary: 257 mg/dL — ABNORMAL HIGH (ref 70–99)

## 2021-03-09 IMAGING — MR MR FOOT*R* W/O CM
5 series · 37 of 40 positions shown · non-contrast
Comparison: X-ray [DATE]

CLINICAL DATA: Right foot pain and swelling.  Cellulitis

EXAM:
MRI OF THE RIGHT FOREFOOT WITHOUT CONTRAST
TECHNIQUE: Multiplanar, multisequence MR imaging of the right forefoot was
performed. No intravenous contrast was administered.

[Series 3: T1 · coronal · right · 3.0mm · 0.47mm/px · 8 of 45 slices shown (1 of 2)]
[im 1/45]
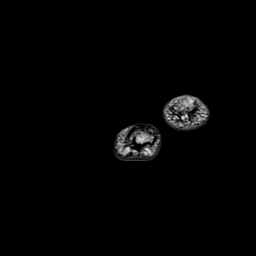
[im 5/45]
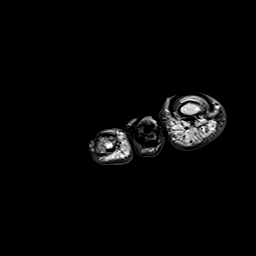
[im 15/45]
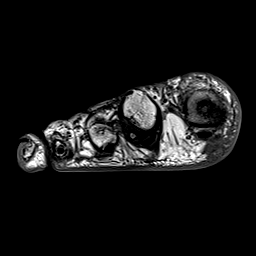
[im 20/45]
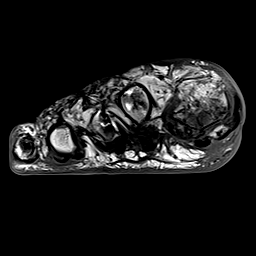
[im 25/45]
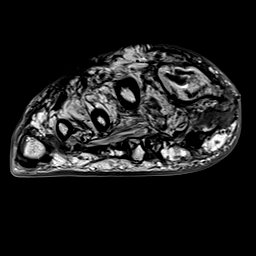
[im 30/45]
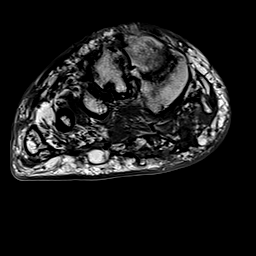
[im 40/45]
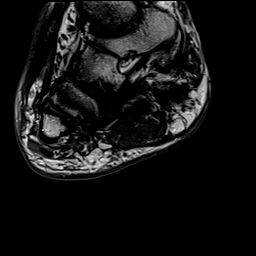
[im 45/45]
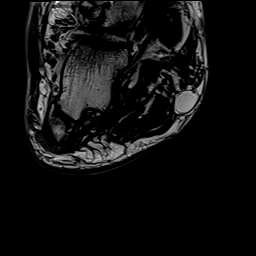

[Series 4: T2 fat-sat · coronal · right · 3.0mm · 0.38mm/px · 9 of 45 slices shown (1 of 2)]
[im 1/45]
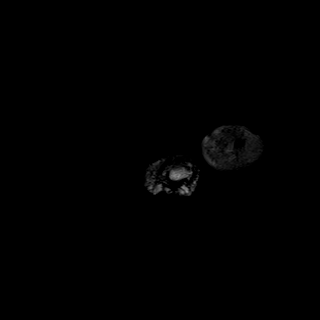
[im 5/45]
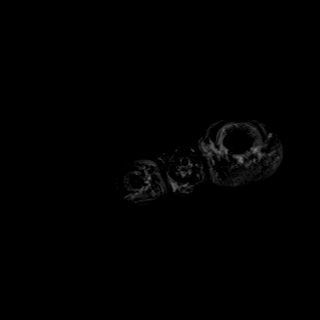
[im 10/45]
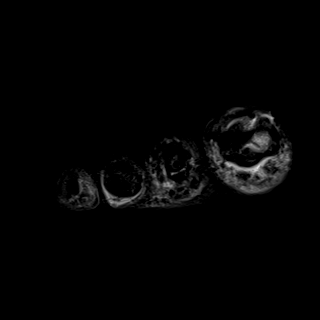
[im 15/45]
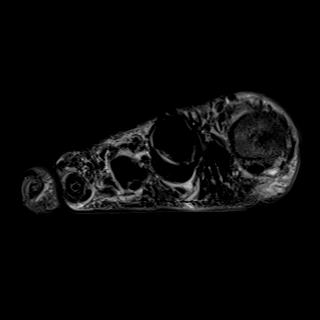
[im 20/45]
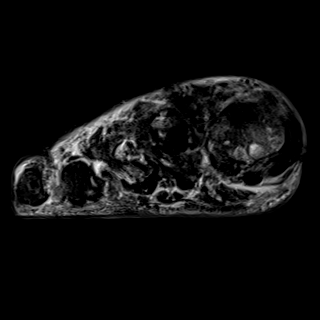
[im 25/45]
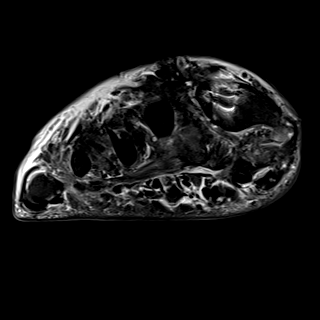
[im 30/45]
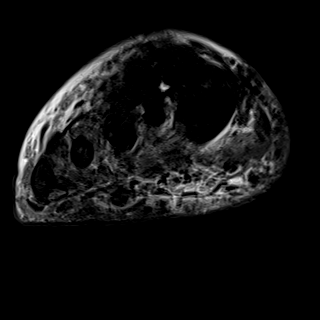
[im 40/45]
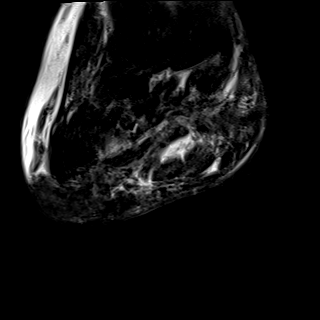
[im 45/45]
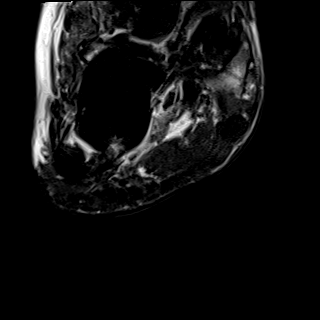

[Series 5: T2 fat-sat · axial · right · 3.0mm · 0.70mm/px · z∈[-58,+22]mm · 6 of 25 slices shown (2 of 2)]
[im 1/25]
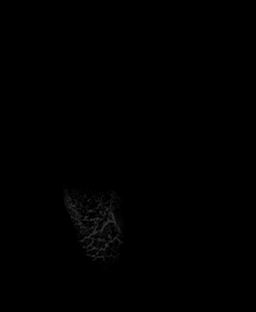
[im 5/25]
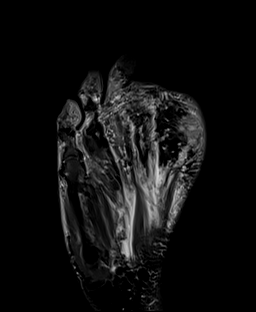
[im 10/25]
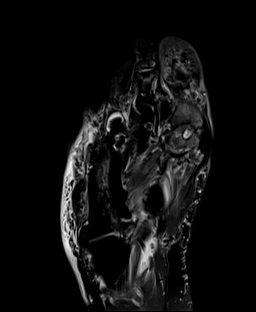
[im 15/25]
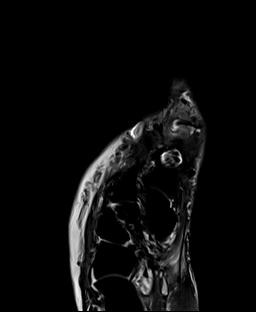
[im 20/25]
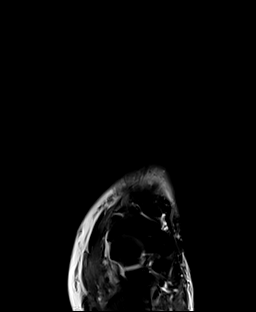
[im 25/25]
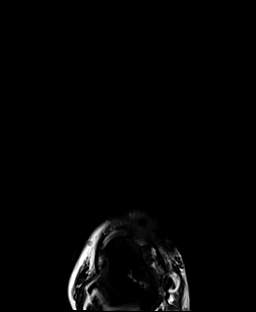

[Series 6: T1 · axial · right · 3.0mm · 0.70mm/px · z∈[-58,+22]mm · 6 of 25 slices shown (2 of 2)]
[im 1/25]
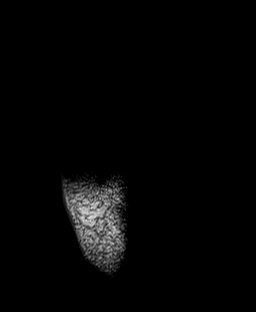
[im 5/25]
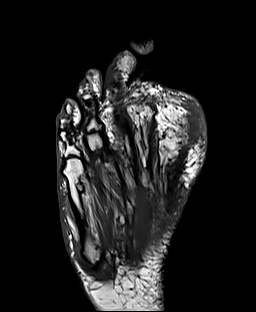
[im 10/25]
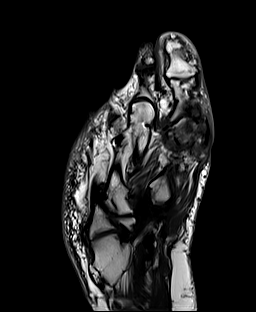
[im 15/25]
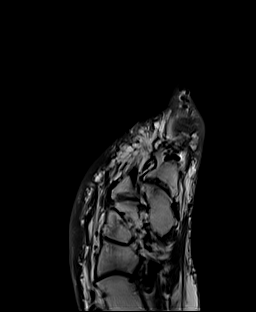
[im 20/25]
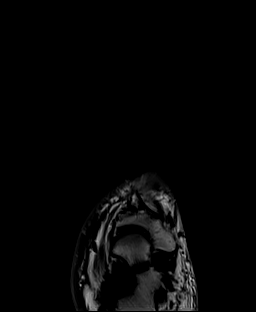
[im 25/25]
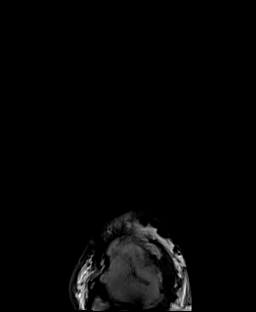

[Series 8: STIR · sagittal · right · 3.0mm · 0.35mm/px · 8 of 35 slices shown]
[im 1/35]
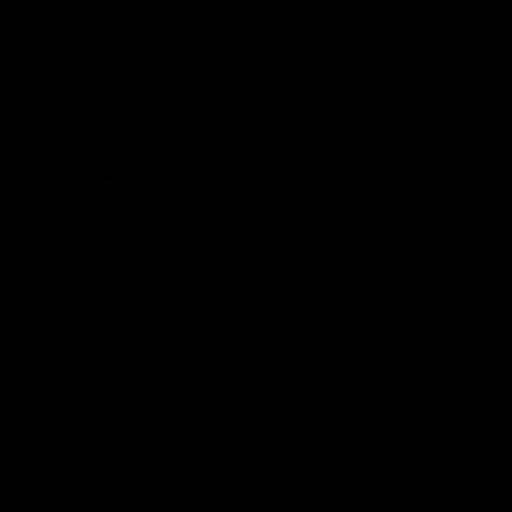
[im 5/35]
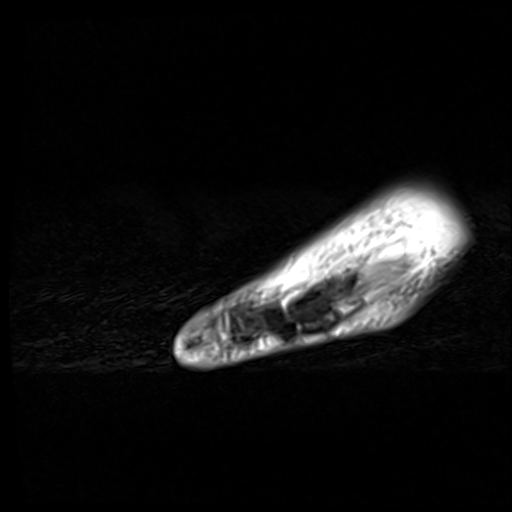
[im 10/35]
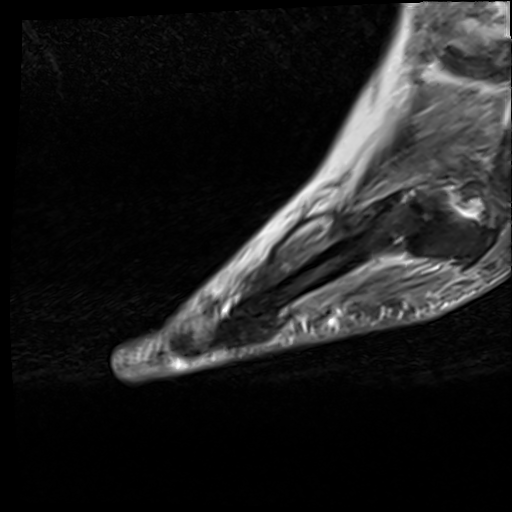
[im 15/35]
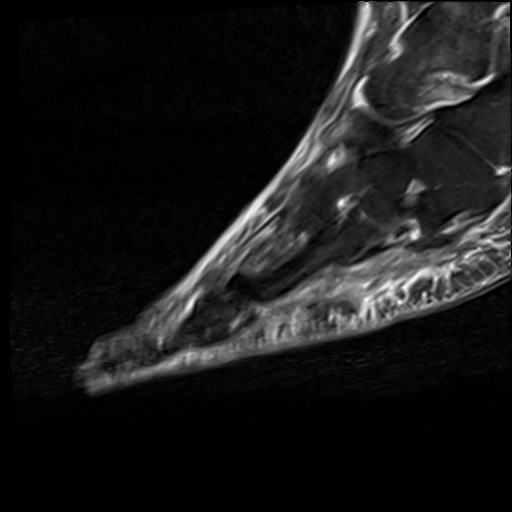
[im 20/35]
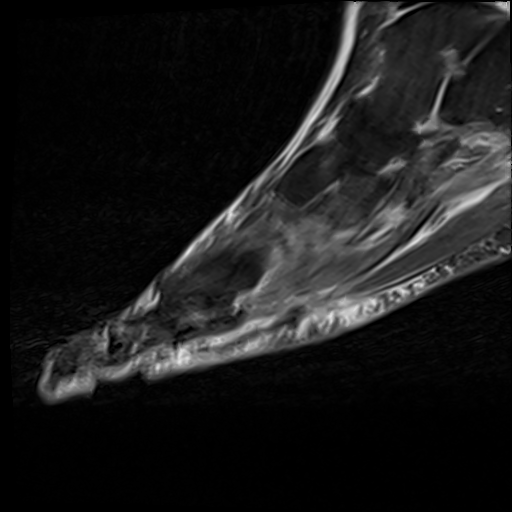
[im 25/35]
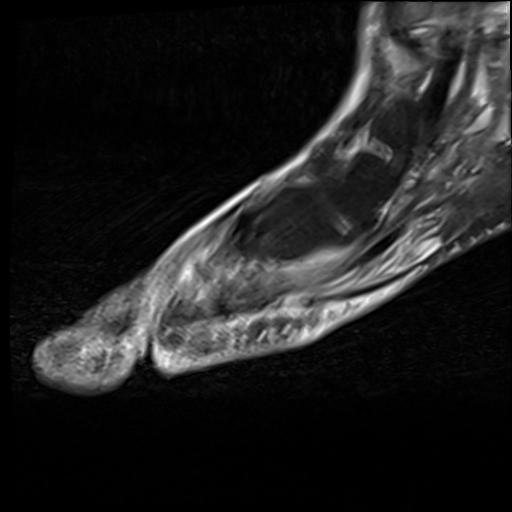
[im 30/35]
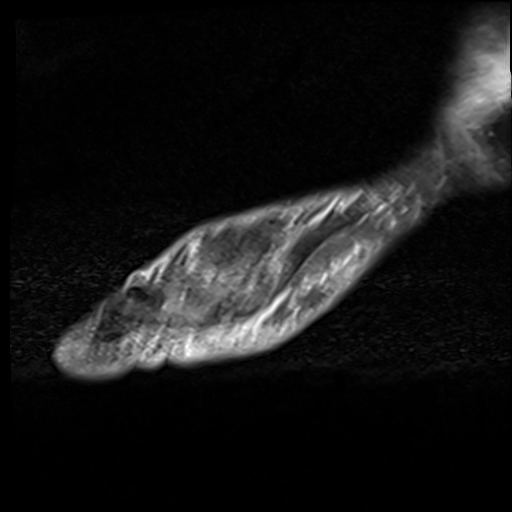
[im 35/35]
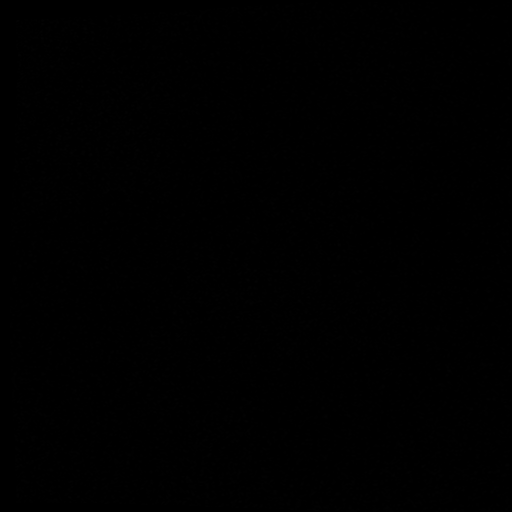

[37 of 40 positions shown; findings below may reference images not displayed]

FINDINGS: Bones/Joint/Cartilage

Extensive postsurgical changes throughout the right foot including
bunionectomy and multiple hammertoe corrections. Multifocal areas of
susceptibility artifact related to the prior surgeries. Chronic
appearing deformity of the first metatarsal. Patchy bone marrow
edema throughout the first metatarsal head and neck as well as
within the base of the great toe proximal phalanx, which is likely
degenerative/reactive. Multifocal subchondral cystic changes along
both sides of the joint. No obvious sites of acute bone destruction.

No acute fracture. No dislocation. Advanced degenerative changes
throughout the right foot, most pronounced at the first MTP joint.

Ligaments

Intact Lisfranc ligament.  Grossly intact collateral ligaments.

Muscles and Tendons

Chronic denervation changes of the intrinsic foot musculature. No
apparent tenosynovial fluid collection.

Soft tissues

No focal soft tissue ulceration. Diffuse subcutaneous edema
throughout the foot. No organized fluid collections.
IMPRESSION: 1. Patchy bone marrow edema throughout the first metatarsal head and
neck as well as within the base of the great toe proximal phalanx,
which is likely degenerative/reactive. Changes related to a
superimposed infectious osteitis are not entirely excluded. No
obvious sites of acute bone destruction to suggest acute
osteomyelitis.
2. Diffuse subcutaneous edema throughout the foot, which may reflect
cellulitis. No organized fluid collections.
3. Extensive postsurgical changes and advanced degenerative changes
throughout the right foot.

## 2021-03-09 IMAGING — MR MR FOOT*L* W/O CM
5 series · 40 of 40 positions shown · non-contrast
Comparison: X-ray [DATE], [DATE]

CLINICAL DATA: Left foot pain and swelling.  Cellulitis.

EXAM:
MRI OF THE LEFT FOOT WITHOUT CONTRAST
TECHNIQUE: Multiplanar, multisequence MR imaging of the left forefoot was
performed. No intravenous contrast was administered.

[Series 5: T1 · coronal · left · 3.0mm · 0.47mm/px · 11 of 39 slices shown (1 of 2)]
[im 1/39]
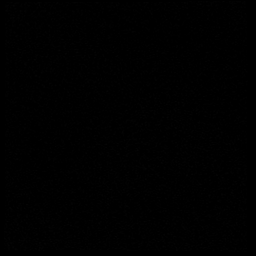
[im 4/39]
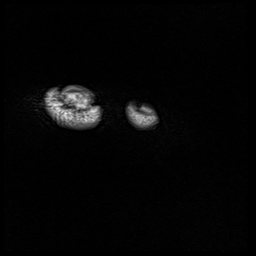
[im 8/39]
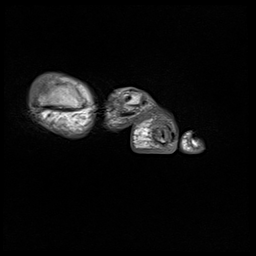
[im 12/39]
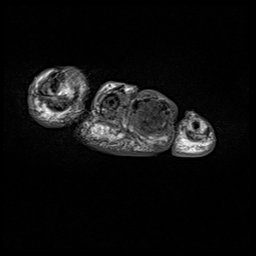
[im 16/39]
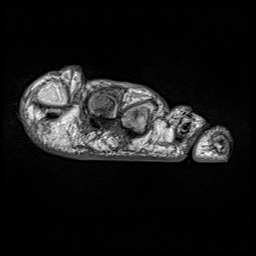
[im 20/39]
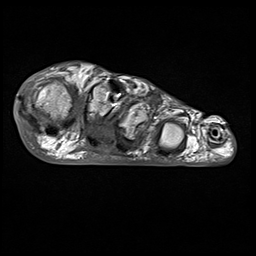
[im 23/39]
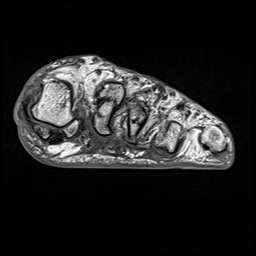
[im 27/39]
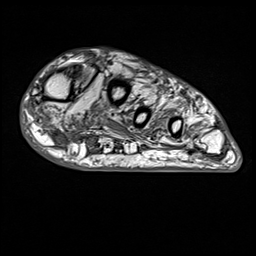
[im 31/39]
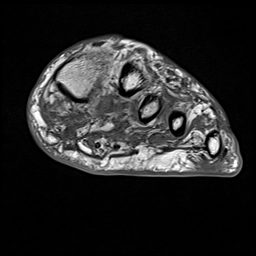
[im 35/39]
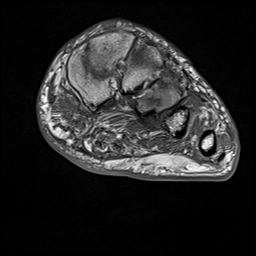
[im 39/39]
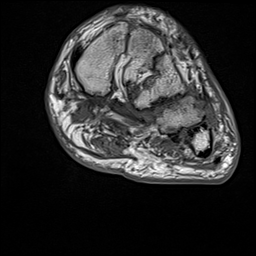

[Series 6: T2 fat-sat · coronal · left · 3.0mm · 0.38mm/px · 10 of 38 slices shown (1 of 2)]
[im 1/38]
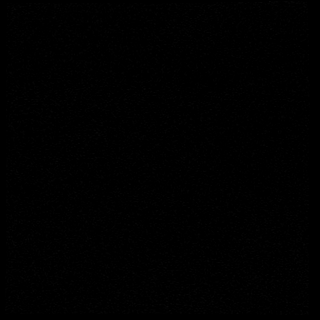
[im 5/38]
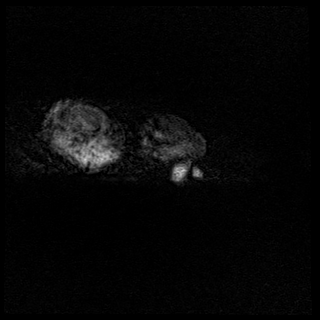
[im 9/38]
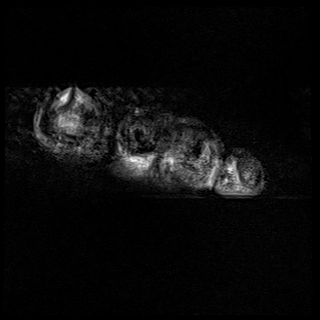
[im 13/38]
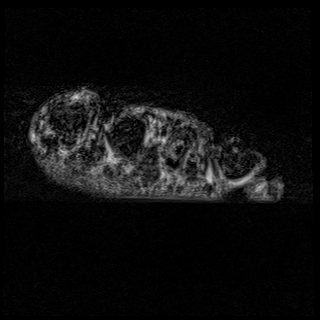
[im 17/38]
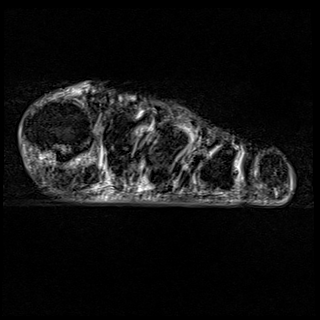
[im 21/38]
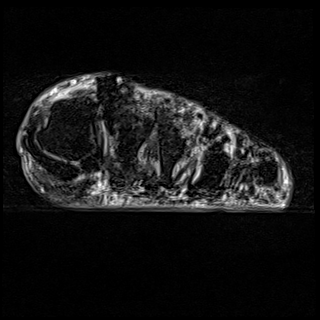
[im 25/38]
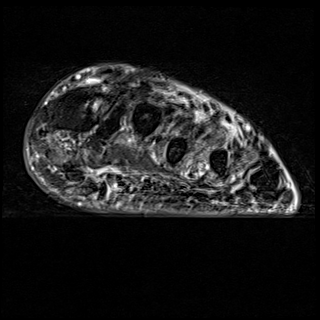
[im 29/38]
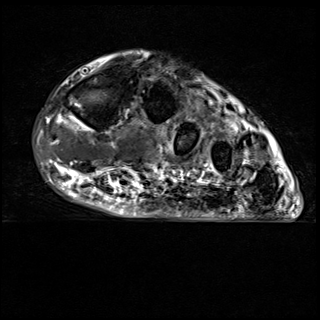
[im 33/38]
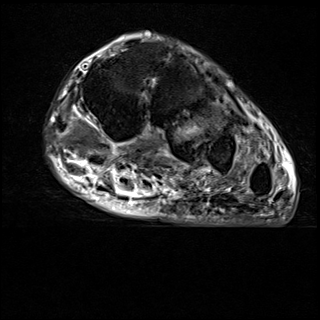
[im 38/38]
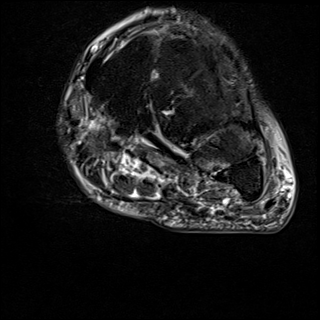

[Series 7: T2 fat-sat · axial · left · 3.0mm · 0.70mm/px · z∈[-59,+18]mm · 6 of 25 slices shown (2 of 2)]
[im 1/25]
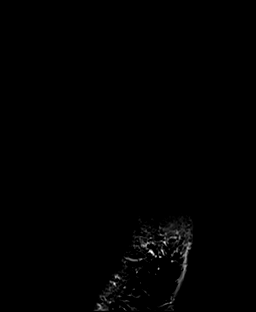
[im 5/25]
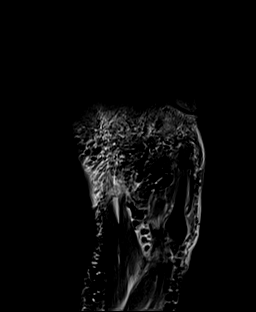
[im 10/25]
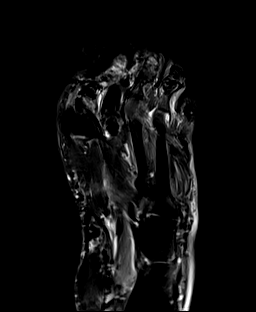
[im 15/25]
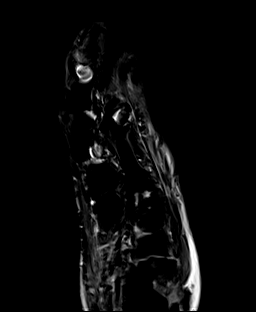
[im 20/25]
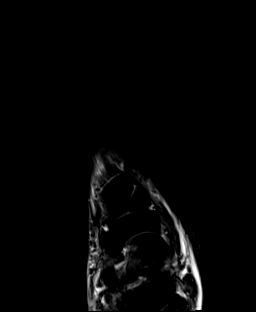
[im 25/25]
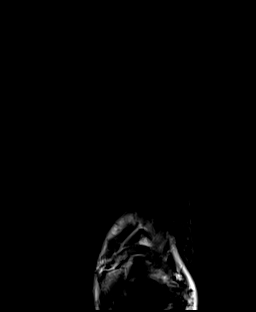

[Series 8: T1 · axial · left · 3.0mm · 0.70mm/px · z∈[-59,+18]mm · 6 of 25 slices shown (2 of 2)]
[im 1/25]
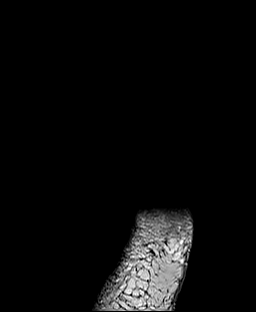
[im 5/25]
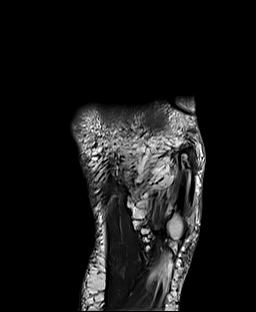
[im 10/25]
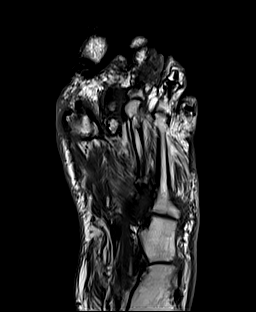
[im 15/25]
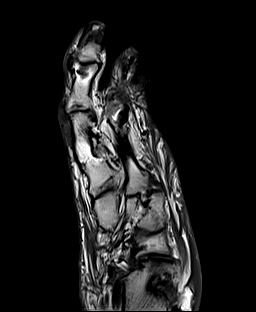
[im 20/25]
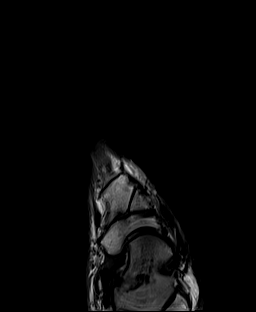
[im 25/25]
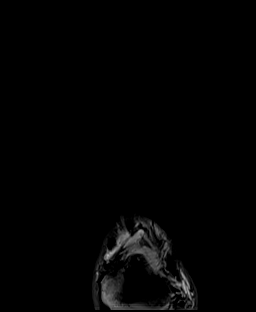

[Series 9: STIR · sagittal · left · 3.0mm · 0.35mm/px · 7 of 28 slices shown]
[im 1/28]
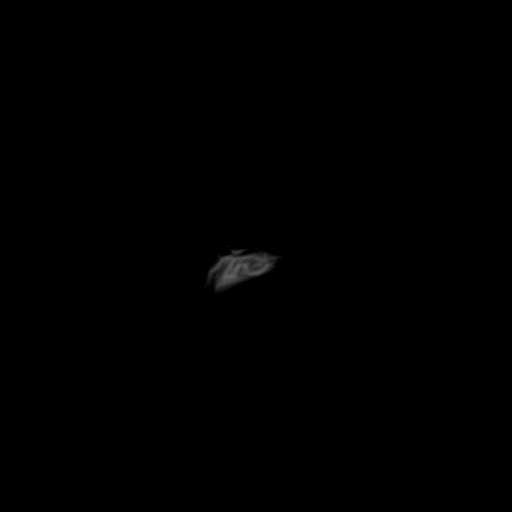
[im 5/28]
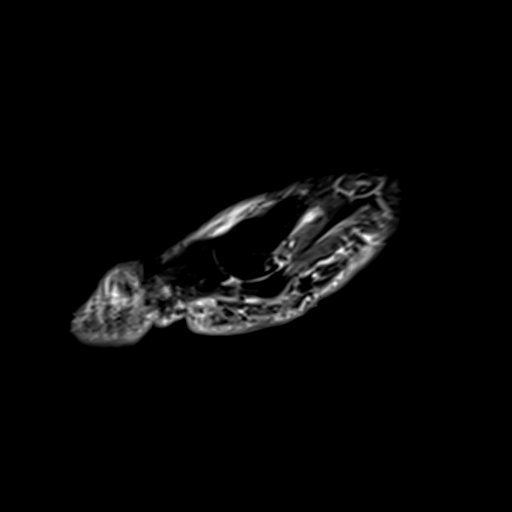
[im 10/28]
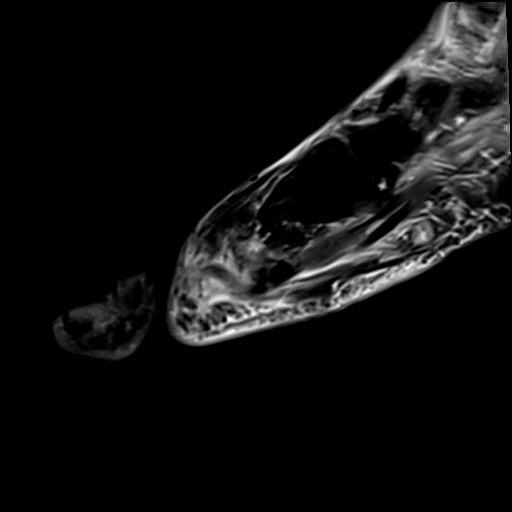
[im 14/28]
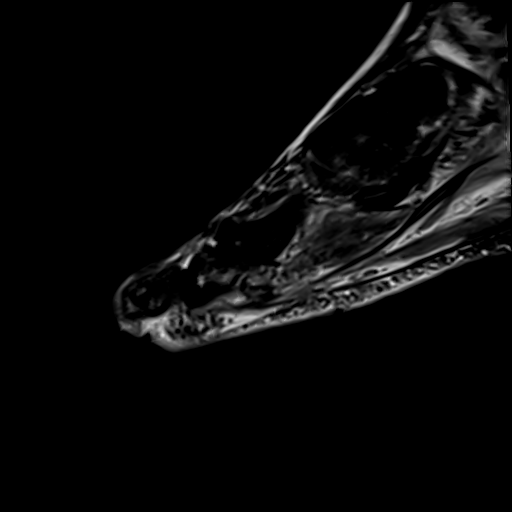
[im 19/28]
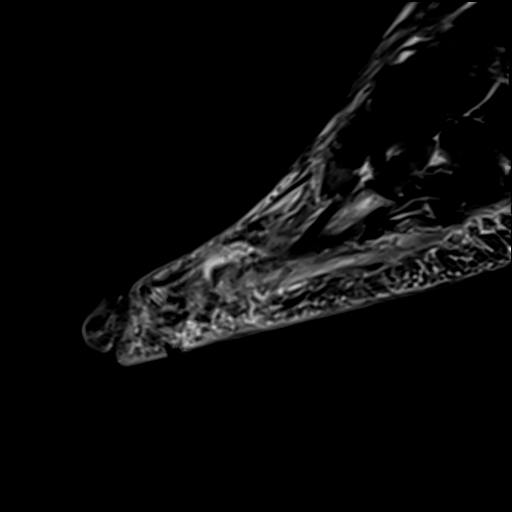
[im 23/28]
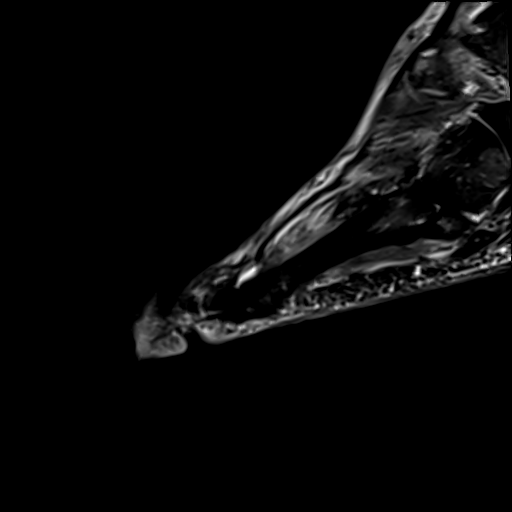
[im 28/28]
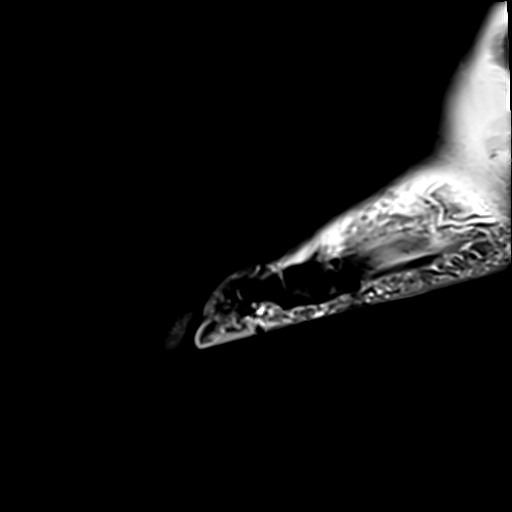

[40 of 40 positions shown; findings below may reference images not displayed]

FINDINGS: Bones/Joint/Cartilage

Bone marrow edema with low T1 signal within the left third toe
centered at the middle phalanx (series 8, images 15-16), suspicious
for acute osteomyelitis. Multifocal areas of susceptibility artifact
within the left foot including the first metatarsal, great toe
proximal phalanx, second metatarsal and involving the second through
fifth toes compatible with prior surgery. Additional areas of bone
marrow edema including the third metatarsal head, third metatarsal
base, and fourth metatarsal base are felt to represent reactive or
stress related changes given the preservation of the T1 bone marrow
signal. No acute fracture is identified. No dislocation. Advanced
degenerative changes throughout the foot.

Ligaments

Intact Lisfranc ligament. Collateral ligaments remain grossly
intact.

Muscles and Tendons

Chronic denervation changes of the intrinsic foot musculature. No
significant tenosynovial fluid collection.

Soft tissues

Prominent soft tissue swelling of the third toe with edema. No
well-defined erosion. Mild diffuse swelling throughout the forefoot.
No organized fluid collection.
IMPRESSION: 1. Bone marrow signal changes within the left third toe centered at
the middle phalanx are suspicious for acute osteomyelitis.
2. Soft tissue swelling and probable cellulitis involving the third
toe.
3. Additional areas of bone marrow edema including the third
metatarsal head, third metatarsal base, and fourth metatarsal base
are felt to represent reactive or stress related changes.
4. Extensive postsurgical changes and advanced degenerative changes
throughout the left foot.

## 2021-03-09 MED ORDER — COLCHICINE 0.6 MG PO TABS
0.6000 mg | ORAL_TABLET | Freq: Two times a day (BID) | ORAL | Status: AC
  Administered 2021-03-09 (×2): 0.6 mg via ORAL
  Filled 2021-03-09 (×2): qty 1

## 2021-03-09 MED ORDER — INSULIN ASPART 100 UNIT/ML IJ SOLN
0.0000 [IU] | Freq: Three times a day (TID) | INTRAMUSCULAR | Status: DC
  Administered 2021-03-09 – 2021-03-10 (×2): 3 [IU] via SUBCUTANEOUS
  Administered 2021-03-10: 11 [IU] via SUBCUTANEOUS
  Administered 2021-03-10: 5 [IU] via SUBCUTANEOUS
  Administered 2021-03-11: 3 [IU] via SUBCUTANEOUS

## 2021-03-09 MED ORDER — COLCHICINE 0.6 MG PO TABS
0.6000 mg | ORAL_TABLET | Freq: Every day | ORAL | Status: DC
  Administered 2021-03-10 – 2021-03-11 (×2): 0.6 mg via ORAL
  Filled 2021-03-09 (×3): qty 1

## 2021-03-09 NOTE — Progress Notes (Signed)
PROGRESS NOTE    Marc Krueger  GYI:[id number] DOB: [birth year] DOA: 03/07/2021 PCP: Pcp, No   Brief Narrative: Marc Krueger is a 65 y.o. male with no known medical history. Patient presented secondary to foot pain/swelling which has worsened over the last 3-4 weeks. Concern for cellulitis on admission with x-ray concerning for possible osteomyelitis. Ceftriaxone and Zosyn IV initiated. Blood cultures obtained.   Assessment & Plan:   Principal Problem:   Cellulitis of left foot Active Problems:   Cellulitis of foot   Sepsis ([place])   Effusion of right knee   Hyponatremia   Cellulitis of bilateral feet Concern for possible osteomyelitis. Initial source appears to be left third toe. Associated fevers, chills, leukocytosis. CRP of 18.6 (down from 76.6 in September). Improving with IV antibiotics. Blood cultures no growth to date. MRI significant for features concerning for left third toe osteomyelitis. -Continue Ceftriaxone since there is improvement in presentation -Follow-up blood cultures -Infectious disease/podiatry consultations -PT eval  Gout flare Aspirated right knee fluid consistent with gout flare, although no WBC available. Orthopedic surgery injected lidocaine/solu-medrol on 10/14 -Start Colchicine -Analgesics prn  Sepsis Did not meet criteria on admission but now has fever with associated leukocytosis. Blood cultures with no growth to date. Secondary to left foot infection. -Continue antibiotics as mentioned above  Prediabetes Hemoglobin A1C of 6.4%. Patient has been working on diet and weight loss. He reports about a 15 lb intentional weight loss in the last month. He is not agreeable to considering metformin at this time. Will recommend continued diet/weight loss in addition to obtaining a PCP for continued monitoring/management. -SSI  CKD stage II Baseline creatinine of 1.1. Stable  Hyponatremia Mild. Asymptomatic. Improved.   DVT prophylaxis:  Lovenox Code Status:   Code Status: Full Code Family Communication: None at bedside Disposition Plan: Discharge home likely in 2-5 days pending ID/podiatry recommendations   Consultants:  Orthopedic Rosanne Gutting) Podiatry Infectious disease  Procedures:  ARTHROCENTESIS (03/08/2021) KNEE INJECTION (03/08/2021)  Antimicrobials: Ceftriaxone IV Zosyn IV    Subjective: Knee pain is improved since aspiration/injection. Having trouble ambulating  Objective: Vitals:   03/08/21 0723 03/08/21 1207 03/08/21 2005 03/09/21 0518  BP: 128/69 122/73 114/77 (!) 119/93  Pulse: 83 86 (!) 39 86  Resp: 18 16 20 20   Temp: 98.7 F (37.1 C) 100 F (37.8 C) 98.8 F (37.1 C) 98.1 F (36.7 C)  TempSrc: Oral Oral Oral Oral  SpO2: 96% 97% 96% 98%  Weight:      Height:        Intake/Output Summary (Last 24 hours) at 03/09/2021 1303 Last data filed at 03/09/2021 0500 Gross per 24 hour  Intake 200 ml  Output 650 ml  Net -450 ml    Filed Weights   03/07/21 1305  Weight: 106.6 kg    Examination:  General exam: Appears calm and comfortable Respiratory system: Clear to auscultation. Respiratory effort normal. Cardiovascular system: S1 & S2 heard, RRR. No murmurs, rubs, gallops or clicks. Gastrointestinal system: Abdomen is nondistended, soft and nontender. No organomegaly or masses felt. Normal bowel sounds heard. Central nervous system: Alert and oriented. No focal neurological deficits. Musculoskeletal: Right knee with mild-moderate effusion, mild warmth, no significant tenderness. No erythema of feet noted Skin: No cyanosis. No rashes Psychiatry: Judgement and insight appear normal. Mood & affect appropriate.     Data Reviewed: I have personally reviewed following labs and imaging studies  CBC Lab Results  Component Value Date   WBC 16.9 (H) 03/09/2021   RBC  4.47 03/09/2021   HGB 13.9 03/09/2021   HCT 41.9 03/09/2021   MCV 93.7 03/09/2021   MCH 31.1 03/09/2021   PLT 300  03/09/2021   MCHC 33.2 03/09/2021   RDW 13.0 03/09/2021   LYMPHSABS 1.0 03/07/2021   MONOABS 2.1 (H) 03/07/2021   EOSABS 0.0 03/07/2021   BASOSABS 0.1 37/02/6268     Last metabolic panel Lab Results  Component Value Date   NA 133 (L) 03/09/2021   K 4.7 03/09/2021   CL 99 03/09/2021   CO2 24 03/09/2021   BUN 21 03/09/2021   CREATININE 1.28 (H) 03/09/2021   GLUCOSE 208 (H) 03/09/2021   GFRNONAA >60 03/09/2021   GFRAA 86 (L) 12/09/2012   CALCIUM 8.9 03/09/2021   PROT 7.3 03/09/2021   ALBUMIN 3.3 (L) 03/09/2021   BILITOT 1.0 03/09/2021   ALKPHOS 78 03/09/2021   AST 15 03/09/2021   ALT 17 03/09/2021   ANIONGAP 10 03/09/2021    CBG (last 3)  Recent Labs    03/08/21 2008 03/09/21 0754 03/09/21 1204  GLUCAP 197* 180* 342*      GFR: Estimated Creatinine Clearance: 72.4 mL/min (A) (by C-G formula based on SCr of 1.28 mg/dL (H)).  Coagulation Profile: No results for input(s): INR, PROTIME in the last 168 hours.  Recent Results (from the past 240 hour(s))  Blood culture (routine x 2)     Status: None (Preliminary result)   Collection Time: 03/07/21  2:02 PM   Specimen: Right Antecubital; Blood  Result Value Ref Range Status   Specimen Description   Final    RIGHT ANTECUBITAL Performed at [facility], [address] [place], [place] [address]    Special Requests   Final    BOTTLES DRAWN AEROBIC AND ANAEROBIC Blood Culture adequate volume Performed at [facility], [address] [place], [place] [address]    Culture   Final    NO GROWTH 2 DAYS Performed at [facility], [place] [address] [place], [place] [address]    Report Status PENDING  Incomplete  Blood culture (routine x 2)     Status: None (Preliminary result)   Collection Time: 03/07/21  2:11 PM   Specimen: Left Antecubital; Blood  Result Value Ref Range Status   Specimen Description   Final    LEFT ANTECUBITAL Performed at [facility], [address]  [place], [place] [address]    Special Requests   Final    BOTTLES DRAWN AEROBIC AND ANAEROBIC Blood Culture adequate volume Performed at [facility], [address] [place], [place] [address]    Culture   Final    NO GROWTH 2 DAYS Performed at [facility], [place] [address], [place], [place] [address]    Report Status PENDING  Incomplete  Resp Panel by RT-PCR (Flu A&B, Covid) Nasopharyngeal Swab     Status: None   Collection Time: 03/07/21  3:35 PM   Specimen: Nasopharyngeal Swab; Nasopharyngeal(NP) swabs in vial transport medium  Result Value Ref Range Status   SARS Coronavirus 2 by RT PCR NEGATIVE NEGATIVE Final    Comment: (NOTE) SARS-CoV-2 target nucleic acids are NOT DETECTED.  The SARS-CoV-2 RNA is generally detectable in upper respiratory specimens during the acute phase of infection. The lowest concentration of SARS-CoV-2 viral copies this assay can detect is 138 copies/mL. A negative result does not preclude SARS-Cov-2 infection and should not be used as the sole basis  for treatment or other patient management decisions. A negative result may occur with  improper specimen collection/handling, submission of specimen other than nasopharyngeal swab, presence of viral mutation(s) within the areas targeted by this assay, and inadequate number of viral copies(<138 copies/mL). A negative result must be combined with clinical observations, patient history, and epidemiological information. The expected result is Negative.  Fact Sheet for Patients:  EntrepreneurPulse.com.au  Fact Sheet for Healthcare Providers:  IncredibleEmployment.be  This test is no t yet approved or cleared by the [place] FDA and  has been authorized for detection and/or diagnosis of SARS-CoV-2 by FDA under an Emergency Use Authorization (EUA). This EUA will remain  in effect (meaning this test can be used) for the duration of the COVID-19 declaration  under Section 564(b)(1) of the Act, 21 U.S.C.section 360bbb-3(b)(1), unless the authorization is terminated  or revoked sooner.       Influenza A by PCR NEGATIVE NEGATIVE Final   Influenza B by PCR NEGATIVE NEGATIVE Final    Comment: (NOTE) The Xpert Xpress SARS-CoV-2/FLU/RSV plus assay is intended as an aid in the diagnosis of influenza from Nasopharyngeal swab specimens and should not be used as a sole basis for treatment. Nasal washings and aspirates are unacceptable for Xpert Xpress SARS-CoV-2/FLU/RSV testing.  Fact Sheet for Patients: EntrepreneurPulse.com.au  Fact Sheet for Healthcare Providers: IncredibleEmployment.be  This test is not yet approved or cleared by the [place] FDA and has been authorized for detection and/or diagnosis of SARS-CoV-2 by FDA under an Emergency Use Authorization (EUA). This EUA will remain in effect (meaning this test can be used) for the duration of the COVID-19 declaration under Section 564(b)(1) of the Act, 21 U.S.C. section 360bbb-3(b)(1), unless the authorization is terminated or revoked.  Performed at [facility], [address] [place], [place] [address]   Body fluid culture w Gram Stain (indicate joint specimen source)     Status: None (Preliminary result)   Collection Time: 03/08/21  1:07 PM   Specimen: KNEE; Body Fluid  Result Value Ref Range Status   Specimen Description   Final    KNEE Performed at [facility], [place] [address], [place], [place] [address]    Special Requests   Final    Normal Performed at [facility], [place] [address], [place], [place] [address]    Gram Stain   Final    ABUNDANT WBC PRESENT, PREDOMINANTLY PMN NO ORGANISMS SEEN    Culture   Final    NO GROWTH < 24 HOURS Performed at [place] [address], [place], [place] [address]    Report Status PENDING  Incomplete         Radiology  Studies: DG Chest 2 View  Result Date: 03/07/2021 CLINICAL DATA:  Lower extremity edema. EXAM: CHEST - 2 VIEW COMPARISON:  Chest x-ray 11/26/2018, CT chest 10/10/2014 FINDINGS: The heart and mediastinal contours are within normal limits. No focal consolidation. No pulmonary edema. No pleural effusion. No pneumothorax. No acute osseous abnormality. IMPRESSION: No active cardiopulmonary disease. Electronically Signed   By: Iven Finn M.D.   On: 03/07/2021 16:00   MR FOOT RIGHT WO CONTRAST  Result Date: 03/09/2021 CLINICAL DATA:  Right foot pain and swelling.  Cellulitis EXAM: MRI OF THE RIGHT FOREFOOT WITHOUT CONTRAST TECHNIQUE: Multiplanar, multisequence MR imaging of the right forefoot was performed. No intravenous contrast was administered. COMPARISON:  X-ray 03/07/2021 FINDINGS: Bones/Joint/Cartilage Extensive postsurgical changes throughout the right foot including bunionectomy  and multiple hammertoe corrections. Multifocal areas of susceptibility artifact related to the prior surgeries. Chronic appearing deformity of the first metatarsal. Patchy bone marrow edema throughout the first metatarsal head and neck as well as within the base of the great toe proximal phalanx, which is likely degenerative/reactive. Multifocal subchondral cystic changes along both sides of the joint. No obvious sites of acute bone destruction. No acute fracture. No dislocation. Advanced degenerative changes throughout the right foot, most pronounced at the first MTP joint. Ligaments Intact Lisfranc ligament.  Grossly intact collateral ligaments. Muscles and Tendons Chronic denervation changes of the intrinsic foot musculature. No apparent tenosynovial fluid collection. Soft tissues No focal soft tissue ulceration. Diffuse subcutaneous edema throughout the foot. No organized fluid collections. IMPRESSION: 1. Patchy bone marrow edema throughout the first metatarsal head and neck as well as within the base of the great toe  proximal phalanx, which is likely degenerative/reactive. Changes related to a superimposed infectious osteitis are not entirely excluded. No obvious sites of acute bone destruction to suggest acute osteomyelitis. 2. Diffuse subcutaneous edema throughout the foot, which may reflect cellulitis. No organized fluid collections. 3. Extensive postsurgical changes and advanced degenerative changes throughout the right foot. Electronically Signed   By: Davina Poke D.O.   On: 03/09/2021 12:29   MR FOOT LEFT WO CONTRAST  Result Date: 03/09/2021 CLINICAL DATA:  Left foot pain and swelling.  Cellulitis. EXAM: MRI OF THE LEFT FOOT WITHOUT CONTRAST TECHNIQUE: Multiplanar, multisequence MR imaging of the left forefoot was performed. No intravenous contrast was administered. COMPARISON:  X-ray 03/07/2021, 02/22/2021 FINDINGS: Bones/Joint/Cartilage Bone marrow edema with low T1 signal within the left third toe centered at the middle phalanx (series 8, images 15-16), suspicious for acute osteomyelitis. Multifocal areas of susceptibility artifact within the left foot including the first metatarsal, great toe proximal phalanx, second metatarsal and involving the second through fifth toes compatible with prior surgery. Additional areas of bone marrow edema including the third metatarsal head, third metatarsal base, and fourth metatarsal base are felt to represent reactive or stress related changes given the preservation of the T1 bone marrow signal. No acute fracture is identified. No dislocation. Advanced degenerative changes throughout the foot. Ligaments Intact Lisfranc ligament. Collateral ligaments remain grossly intact. Muscles and Tendons Chronic denervation changes of the intrinsic foot musculature. No significant tenosynovial fluid collection. Soft tissues Prominent soft tissue swelling of the third toe with edema. No well-defined erosion. Mild diffuse swelling throughout the forefoot. No organized fluid collection.  IMPRESSION: 1. Bone marrow signal changes within the left third toe centered at the middle phalanx are suspicious for acute osteomyelitis. 2. Soft tissue swelling and probable cellulitis involving the third toe. 3. Additional areas of bone marrow edema including the third metatarsal head, third metatarsal base, and fourth metatarsal base are felt to represent reactive or stress related changes. 4. Extensive postsurgical changes and advanced degenerative changes throughout the left foot. Electronically Signed   By: Davina Poke D.O.   On: 03/09/2021 12:23   DG Foot Complete Left  Result Date: 03/07/2021 CLINICAL DATA:  Toe pain.  Foot swelling. EXAM: LEFT FOOT - COMPLETE 3+ VIEW COMPARISON:  02/22/21 FINDINGS: There is mild diffuse osteopenia. Postoperative changes from previous repair of hallux valgus deformity noted. Chronic deformities involving the distal aspect of the first, second, third, and fifth metatarsal bones appears stable from the previous exam. Arthro pathic changes are noted involving the PIP joints of the second, third, fourth and fifth digits. There are no signs of acute  fracture or dislocation. No focal bone erosions to suggest osteomyelitis. Dorsal soft tissue swelling is identified overlying the metatarsal bones. IMPRESSION: 1. No acute bone abnormality. 2. Chronic deformities involve the first, second, third, and fifth metatarsal bones. 3. Dorsal soft tissue swelling. Electronically Signed   By: Kerby Moors M.D.   On: 03/07/2021 14:46   DG Foot Complete Right  Result Date: 03/07/2021 CLINICAL DATA:  Toe pain.  Cellulitis EXAM: RIGHT FOOT COMPLETE - 3+ VIEW COMPARISON:  None. FINDINGS: Soft tissue swelling medial foot. Negative for fracture or osteomyelitis Prior foot surgery. Prior bunionectomy. There is extensive irregularity of the first metatarsal which could be due to chronic osteomyelitis which has healed. Moderate degenerative change in spurring in the first MTP joint.  Surgical wires overlying the first second and third metatarsals and first proximal phalanx. Widening of the joint space of the second PIP joint with possible prosthesis. Similar findings in the third PIP joint. Correlate with prior history. IMPRESSION: Medial soft tissue swelling. Negative for acute osteomyelitis or fracture. Prior bunionectomy and surgery in the foot. Extensive cortical irregularity of the first metatarsal may be due to prior surgery or chronic osteomyelitis. Electronically Signed   By: Franchot Gallo M.D.   On: 03/07/2021 14:48      Scheduled Meds:  colchicine  0.6 mg Oral BID   Followed by   Derrill Memo ON 03/10/2021] colchicine  0.6 mg Oral Daily   enoxaparin (LOVENOX) injection  50 mg Subcutaneous Q24H   Continuous Infusions:  cefTRIAXone (ROCEPHIN)  IV Stopped (03/08/21 2158)     LOS: 2 days     Cordelia Poche, MD [facility] 03/09/2021, 1:03 PM  If 7PM-7AM, please contact night-coverage [email or web address]

## 2021-03-09 NOTE — Consult Note (Signed)
[facility]                                                                                        Patient Identification: Patient Name: Marc Krueger MRN: [id number] [place] Date: 03/07/2021  1:07 PM Today's Date: 03/09/2021 Reason for consult: concern for osteomyelitis   Requesting provider: Gardenia Phlegm   Principal Problem:   Cellulitis of left foot Active Problems:   Cellulitis of foot   Sepsis ([place])   Effusion of right knee   Hyponatremia   Antibiotics: ceftriaxone 10/14 -current   Lines/Tubes:  Assessment # Left 3rd toe cellulitis in the setting of recent pedicure   H/o prior surgeries in bilateral feet for hammertoes and bunion at the age of 89 with implants present   # Bilateral Foot swelling /pain ( Left >>Rt) including Rt knee: Acute Gout flare with MSU crystals in Rt knee # Left TKA - no issues  # Prediabetes  # Tinea pedis # Leukocytosis- likely reactive in the setting of intraarticular injection of steroids on 10/14  Comments: Clinical presentation is suggestive of acute gout with multiple joint pain and swelling in the setting of recent increased red meat intake. Synovial fluid from Rt knee is positive for MSM crystals. He has significant improvement in the pain and swelling in the rt great toes, left 3rd toes and rt knee on colchicine and IV abtx. He did mention he had a prick in his left 3rd toe during a pedicure before development of left foot pain and swelling and possibly developed some cellulitis which improved with IV abtx. MRI findings noted with concerns for osteomyelitis in the setting of degenerative and post surgical changes  and could be an overread given MRI are highly sensitive.  Agree with Podiatry with Gout being a more likely diagnosis than Osteomyelitis   Recommendations  Will change ceftriaxone to Linezolid. Can complete 10 days of Linezolid for cellulitis   Repeat CBC tomorrow to make sure down trending. Suspect this is reactive Patient will need to fu with PCP/Rheumatology OP for Gout management  Lamisil cream for possible tinea pedis for 12 weeks  ID will sign off for now. Please call with questions or new concerns.   Rest of the management as per the primary team. Please call with questions or concerns.  Thank you for the consult  [name], MD Infectious Disease Physician [facility] Infect[facility] Disease [address] [place], [place] [address] Phone: [phone]  Fax: [phone]  __________________________________________________________________________________________________________ HPI and Hospital Course: 65 year old male with PMH of bilateral surgery on both feet for hammer toes and bunion several years ago who presented to the ED on 10/13 with worsening swelling in bilateral feet for 2 weeks. She was initially evaluated by UC on 9/30 and was started on a course of steroids for possible gout. He had a significant improvement in the pain and swelling of left foot on prednisone. However, it started right back after stopping steroids and the left 3rd toe started having significant pain and redness including rt great toe. Also had subjective fevers and chills.  Of note,  he had a pedicure with potential injury with bleed in the left 3rd toe region 2 weeks prior the development of left foot pain and swelling.   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 Denies smoking, alcohol occasionally and denies IVDU. He also tells me he had almost 8 lbs of steak a week prior to left foot pain and swelling and thinks that might have precipitated the gout episode. Denies prior h/o Gout He tells me he has implants in his bilateral feet after surgeries done in his bilateral feet for hammer toes and bunion at the age of 66.    At ED, febrile, WBC 20.6, a1c 6.4  He started having increasing rt knee pain and swelling during  hospitalization for which rt knee joint was aspirated with positive crystals. No WBC count available. SF cx 10/14 No organisms in gram stain and cx no growth in 2 days  10/13 Blood cx no growth to date Seen by Orthopedics and Podiatry who believe this is likely a gout flare He also had an intraarticular injection of depomedrol in rt knee on 10/14 ' ROS: General- Denies loss of appetite and loss of weight HEENT - Denies headache, blurry vision, neck pain, sinus pain Chest - Denies any chest pain, SOB or cough CVS- Denies any dizziness/lightheadedness, syncopal attacks, palpitations Abdomen- Denies any nausea, vomiting, abdominal pain, hematochezia and diarrhea Neuro - Denies any weakness, numbness, tingling sensation Psych - Denies any changes in mood irritability or depressive symptoms GU- Denies any burning, dysuria, hematuria or increased frequency of urination Skin - denies any rashes/lesions MSK - Rt great toe, left 3rd toe and rt knee pain and swelling resolved   Past Medical History:  Diagnosis Date   Allergy [id number]   seasonal alllergy   Arthritis    osteoarthritis-knees   Sinusitis    occ./occ. ringing in ears   Past Surgical History:  Procedure Laterality Date   foot sugery Bilateral 1980   pt states he had hammertoes and bunions   JOINT REPLACEMENT     LASIK     TOTAL KNEE ARTHROPLASTY Left 12/07/2012   Procedure: LEFT TOTAL KNEE ARTHROPLASTY;  Surgeon: Mauri Pole, MD;  Location: [facility];  Service: Orthopedics;  Laterality: Left;    Scheduled Meds:  colchicine  0.6 mg Oral BID   Followed by   Derrill Memo ON 03/10/2021] colchicine  0.6 mg Oral Daily   enoxaparin (LOVENOX) injection  50 mg Subcutaneous Q24H   insulin aspart  0-15 Units Subcutaneous TID WC   Continuous Infusions:  cefTRIAXone (ROCEPHIN)  IV Stopped (03/08/21 2158)   PRN Meds:.acetaminophen **OR** acetaminophen, oxyCODONE  No Known Allergies  Social History   Socioeconomic History   Marital  status: Married    Spouse name: Not on file   Number of children: Not on file   Years of education: Not on file   Highest education level: Not on file  Occupational History   Not on file  Tobacco Use   Smoking status: Never   Smokeless tobacco: Never  Vaping Use   Vaping Use: Never used  Substance and Sexual Activity   Alcohol use: Yes    Comment: 1 per week   Drug use: No   Sexual activity: Yes  Other Topics Concern   Not on file  Social History Narrative   Not on file   Social Determinants of Health   Financial Resource Strain: Not on file  Food Insecurity: Not on file  Transportation Needs: Not on file  Physical Activity: Not on file  Stress: Not on file  Social Connections: Not on file  Intimate Partner Violence: Not on file   Family History  Problem Relation Age of Onset   Arthritis Mother    Heart disease Mother    Diabetes Mother    Lung cancer Sister    Arthritis Brother    Diabetes Brother    Arthritis Brother     Vitals BP 124/79 (BP Location: Left Arm)   Pulse 82   Temp 98.5 F (36.9 C) (Oral)   Resp 16   Ht 5\' 11"  (1.803 m)   Wt 106.6 kg   SpO2 96%   BMI 32.78 kg/m    Physical Exam Constitutional:  Not in acute distress, appears comfortable     Comments:   Cardiovascular:     Rate and Rhythm: Normal rate and regular rhythm.     Heart sounds: No murmur heard.   Pulmonary:     Effort: Pulmonary effort is normal.     Comments:   Abdominal:     Palpations: Abdomen is soft.     Tenderness: Non tender and non distended   Musculoskeletal:        General: No swelling or tenderness. Fungal infection in toe nails  RT great toe - no swelling tenderness and erythema  Left 3rd to - no swelling, tenderness and erythema   Skin:    Comments: No lesions or rashes   Neurological:     General: No focal deficit present.   Psychiatric:        Mood and Affect: Mood normal.    Pertinent Microbiology Results for orders placed or performed  during the hospital encounter of 03/07/21  Blood culture (routine x 2)     Status: None (Preliminary result)   Collection Time: 03/07/21  2:02 PM   Specimen: Right Antecubital; Blood  Result Value Ref Range Status   Specimen Description   Final    RIGHT ANTECUBITAL Performed at [facility], [address] [place], [place] [address]    Special Requests   Final    BOTTLES DRAWN AEROBIC AND ANAEROBIC Blood Culture adequate volume Performed at [facility], [address] [place], [place] [address]    Culture   Final    NO GROWTH 3 DAYS Performed at [facility], [place] [address] [place], [place] [address]    Report Status PENDING  Incomplete  Blood culture (routine x 2)     Status: None (Preliminary result)   Collection Time: 03/07/21  2:11 PM   Specimen: Left Antecubital; Blood  Result Value Ref Range Status   Specimen Description   Final    LEFT ANTECUBITAL Performed at [facility], [address] [place], [place] [address]    Special Requests   Final    BOTTLES DRAWN AEROBIC AND ANAEROBIC Blood Culture adequate volume Performed at [facility], [address] [place], [place] [address]    Culture   Final    NO GROWTH 3 DAYS Performed at [facility], [place] [address] [place], [place] [address]    Report Status PENDING  Incomplete  Resp Panel by RT-PCR (Flu A&B, Covid) Nasopharyngeal Swab     Status: None   Collection Time: 03/07/21  3:35 PM   Specimen: Nasopharyngeal Swab; Nasopharyngeal(NP) swabs in vial transport medium  Result Value Ref Range Status   SARS Coronavirus 2 by RT PCR  NEGATIVE NEGATIVE Final    Comment: (NOTE) SARS-CoV-2 target nucleic acids are NOT DETECTED.  The SARS-CoV-2 RNA is generally detectable in upper respiratory specimens during the acute phase of infection. The lowest concentration of SARS-CoV-2 viral copies this assay can detect is 138 copies/mL. A negative result does not preclude  SARS-Cov-2 infection and should not be used as the sole basis for treatment or other patient management decisions. A negative result may occur with  improper specimen collection/handling, submission of specimen other than nasopharyngeal swab, presence of viral mutation(s) within the areas targeted by this assay, and inadequate number of viral copies(<138 copies/mL). A negative result must be combined with clinical observations, patient history, and epidemiological information. The expected result is Negative.  Fact Sheet for Patients:  EntrepreneurPulse.com.au  Fact Sheet for Healthcare Providers:  IncredibleEmployment.be  This test is no t yet approved or cleared by the [place] FDA and  has been authorized for detection and/or diagnosis of SARS-CoV-2 by FDA under an Emergency Use Authorization (EUA). This EUA will remain  in effect (meaning this test can be used) for the duration of the COVID-19 declaration under Section 564(b)(1) of the Act, 21 U.S.C.section 360bbb-3(b)(1), unless the authorization is terminated  or revoked sooner.       Influenza A by PCR NEGATIVE NEGATIVE Final   Influenza B by PCR NEGATIVE NEGATIVE Final    Comment: (NOTE) The Xpert Xpress SARS-CoV-2/FLU/RSV plus assay is intended as an aid in the diagnosis of influenza from Nasopharyngeal swab specimens and should not be used as a sole basis for treatment. Nasal washings and aspirates are unacceptable for Xpert Xpress SARS-CoV-2/FLU/RSV testing.  Fact Sheet for Patients: EntrepreneurPulse.com.au  Fact Sheet for Healthcare Providers: IncredibleEmployment.be  This test is not yet approved or cleared by the [place] FDA and has been authorized for detection and/or diagnosis of SARS-CoV-2 by FDA under an Emergency Use Authorization (EUA). This EUA will remain in effect (meaning this test can be used) for the duration of  the COVID-19 declaration under Section 564(b)(1) of the Act, 21 U.S.C. section 360bbb-3(b)(1), unless the authorization is terminated or revoked.  Performed at [facility], [address] [place], [place] [address]   Body fluid culture w Gram Stain (indicate joint specimen source)     Status: None (Preliminary result)   Collection Time: 03/08/21  1:07 PM   Specimen: KNEE; Body Fluid  Result Value Ref Range Status   Specimen Description   Final    KNEE Performed at [facility], [place] [address], [place], [place] [address]    Special Requests   Final    Normal Performed at [facility], [place] [address], [place], [place] [address]    Gram Stain   Final    ABUNDANT WBC PRESENT, PREDOMINANTLY PMN NO ORGANISMS SEEN    Culture   Final    NO GROWTH 2 DAYS Performed at [facility], [place] [address], [place], [place] [address]    Report Status PENDING  Incomplete   Pertinent Lab seen by me: CBC Latest Ref Rng & Units 03/10/2021 03/09/2021 03/08/2021  WBC 4.0 - 10.5 K/uL 24.4(H) 16.9(H) 18.2(H)  Hemoglobin 13.0 - 17.0 g/dL 13.1 13.9 13.3  Hematocrit 39.0 - 52.0 % 38.5(L) 41.9 38.0(L)  Platelets 150 - 400 K/uL 357 300 286   CMP Latest Ref Rng & Units 03/09/2021 03/08/2021 03/07/2021  Glucose 70 - 99 mg/dL 208(H) 213(H) 187(H)  BUN 8 - 23 mg/dL 21  18 21  Creatinine 0.61 - 1.24 mg/dL 1.28(H) 1.05 1.30(H)  Sodium 135 - 145 mmol/L 133(L) 129(L) 133(L)  Potassium 3.5 - 5.1 mmol/L 4.7 4.0 4.6  Chloride 98 - 111 mmol/L 99 98 97(L)  CO2 22 - 32 mmol/L 24 22 25   Calcium 8.9 - 10.3 mg/dL 8.9 8.4(L) 8.9  Total Protein 6.5 - 8.1 g/dL 7.3 - 7.7  Total Bilirubin 0.3 - 1.2 mg/dL 1.0 - 1.7(H)  Alkaline Phos 38 - 126 U/L 78 - 82  AST 15 - 41 U/L 15 - 16  ALT 0 - 44 U/L 17 - 17     Pertinent Imagings/Other Imagings Plain films and CT images have been personally visualized and interpreted; radiology reports have been reviewed. Decision  making incorporated into the Impression / Recommendations.  Left Foot MRI 03/09/21 IMPRESSION: 1. Bone marrow signal changes within the left third toe centered at the middle phalanx are suspicious for acute osteomyelitis. 2. Soft tissue swelling and probable cellulitis involving the third toe. 3. Additional areas of bone marrow edema including the third metatarsal head, third metatarsal base, and fourth metatarsal base are felt to represent reactive or stress related changes. 4. Extensive postsurgical changes and advanced degenerative changes throughout the left foot.    Rt Foot MRI 03/09/21 IMPRESSION: 1. Patchy bone marrow edema throughout the first metatarsal head and neck as well as within the base of the great toe proximal phalanx, which is likely degenerative/reactive. Changes related to a superimposed infectious osteitis are not entirely excluded. No obvious sites of acute bone destruction to suggest acute osteomyelitis. 2. Diffuse subcutaneous edema throughout the foot, which may reflect cellulitis. No organized fluid collections. 3. Extensive postsurgical changes and advanced degenerative changes throughout the right foot.    I spent more than 70  minutes for this patient encounter including review of prior medical records/discussing diagnostics and treatment plan with the patient/family/coordinate care with primary/other specialits with greater than 50% of time in face to face encounter.   Electronically signed by:   [name], MD Infectious Disease Physician [facility] for Infectious Disease Pager: [phone]

## 2021-03-10 DIAGNOSIS — L03116 Cellulitis of left lower limb: Secondary | ICD-10-CM

## 2021-03-10 LAB — GLUCOSE, CAPILLARY
Glucose-Capillary: 235 mg/dL — ABNORMAL HIGH (ref 70–99)
Glucose-Capillary: 307 mg/dL — ABNORMAL HIGH (ref 70–99)
Glucose-Capillary: 170 mg/dL — ABNORMAL HIGH (ref 70–99)
Glucose-Capillary: 257 mg/dL — ABNORMAL HIGH (ref 70–99)

## 2021-03-10 LAB — CBC
HCT: 38.5 % — ABNORMAL LOW (ref 39.0–52.0)
Hemoglobin: 13.1 g/dL (ref 13.0–17.0)
MCH: 30.9 pg (ref 26.0–34.0)
MCHC: 34 g/dL (ref 30.0–36.0)
MCV: 90.8 fL (ref 80.0–100.0)
Platelets: 357 10*3/uL (ref 150–400)
RBC: 4.24 MIL/uL (ref 4.22–5.81)
RDW: 13 % (ref 11.5–15.5)
WBC: 24.4 10*3/uL — ABNORMAL HIGH (ref 4.0–10.5)
nRBC: 0 % (ref 0.0–0.2)

## 2021-03-10 MED ORDER — LINEZOLID 600 MG PO TABS
600.0000 mg | ORAL_TABLET | Freq: Two times a day (BID) | ORAL | Status: DC
  Administered 2021-03-10 – 2021-03-11 (×3): 600 mg via ORAL
  Filled 2021-03-10 (×3): qty 1

## 2021-03-10 MED ORDER — TRAZODONE HCL 50 MG PO TABS
50.0000 mg | ORAL_TABLET | Freq: Once | ORAL | Status: AC
  Administered 2021-03-10: 50 mg via ORAL
  Filled 2021-03-10: qty 1

## 2021-03-10 MED ORDER — TERBINAFINE HCL 1 % EX CREA
TOPICAL_CREAM | Freq: Two times a day (BID) | CUTANEOUS | Status: DC
  Administered 2021-03-10: 1 via TOPICAL
  Filled 2021-03-10 (×2): qty 12

## 2021-03-10 NOTE — Consult Note (Signed)
  Subjective:  Patient ID: Marc Krueger, male    DOB: 19-Jun-1955,  MRN: [id number]  A 65 y.o. male without any past medical history.  Patient presents with concern for left foot pain that progressed over the last 3 to 4 weeks.  Patient has not had a history of gout.  The x-ray was concerning for osteomyelitis versus gouty arthritis.  Patient had an MRI done for which also said it could possibly be osteomyelitis.  Podiatry was consulted to evaluate it further. Objective:   Vitals:   03/09/21 2030 03/10/21 0447  BP: (!) 145/83 124/79  Pulse: 93 82  Resp: 20 16  Temp: 98.6 F (37 C) 98.5 F (36.9 C)  SpO2: 95% 96%   General AA&O x3. Normal mood and affect.  Vascular Dorsalis pedis and posterior tibial pulses 2/4 bilat. Brisk capillary refill to all digits. Pedal hair present.  Neurologic Epicritic sensation grossly intact.  Dermatologic No ulceration noted no erythema noted.  No red hot swollen joint noted to any of the bilateral feet.  No foot and ankle issues noted.  Hammertoe deformities noted.  Orthopedic: MMT 5/5 in dorsiflexion, plantarflexion, inversion, and eversion. Normal joint ROM without pain or crepitus.  1. Patchy bone marrow edema throughout the first metatarsal head and neck as well as within the base of the great toe proximal phalanx, which is likely degenerative/reactive. Changes related to a superimposed infectious osteitis are not entirely excluded. No obvious sites of acute bone destruction to suggest acute osteomyelitis. 2. Diffuse subcutaneous edema throughout the foot, which may reflect cellulitis. No organized fluid collections. 3. Extensive postsurgical changes and advanced degenerative changes throughout the right foot.  IMPRESSION: 1. Bone marrow signal changes within the left third toe centered at the middle phalanx are suspicious for acute osteomyelitis. 2. Soft tissue swelling and probable cellulitis involving the third toe. 3. Additional areas of  bone marrow edema including the third metatarsal head, third metatarsal base, and fourth metatarsal base are felt to represent reactive or stress related changes. 4. Extensive postsurgical changes and advanced degenerative changes throughout the left foot.  Assessment & Plan:  Patient was evaluated and treated and all questions answered.  Bilateral foot left greater than right  gout flare now resolving -All questions and concerns were discussed with the patient in extensive detail. -At this time clinically patient does not have any clinical signs of infection or any chronic ulceration that could have led to infection.  He does not have any red nests associated with the digit.  This likely may have been a gout flare as patient has a increase in red meat diet. -He has been controlling his diet since the flareup. -I would hold off on antibiotics at this time. -Patient will need work-up for gout as well as follow-up with rheumatology in an outpatient setting. -He is okay to be discharged from podiatric standpoint with colchicine and allopurinol for short-term management followed by follow-up with rheumatologist for long-term management. -Podiatry to sign off  Felipa Furnace, DPM  Accessible via secure chat for questions or concerns.

## 2021-03-10 NOTE — Progress Notes (Signed)
PROGRESS NOTE    Marc Krueger  QIH:[id number] DOB: 08-26-1955 DOA: 03/07/2021 PCP: Pcp, No   Brief Narrative: Marc Krueger is a 65 y.o. male with no known medical history. Patient presented secondary to foot pain/swelling which has worsened over the last 3-4 weeks. Concern for cellulitis on admission with x-ray concerning for possible osteomyelitis. Ceftriaxone and Zosyn IV initiated. Blood cultures obtained.   Assessment & Plan:   Principal Problem:   Cellulitis of left foot Active Problems:   Cellulitis of foot   Sepsis ([place])   Effusion of right knee   Hyponatremia   Cellulitis of bilateral feet Concern for possible osteomyelitis. Initial source appears to be left third toe. Associated fevers, chills, leukocytosis. CRP of 18.6 (down from 76.6 in September). Improving with IV antibiotics. Blood cultures no growth to date. MRI significant for features concerning for left third toe osteomyelitis. -Continue Ceftriaxone since there is improvement in presentation -Follow-up blood cultures -Infectious disease/podiatry consulted on 10/15; recommendations pending -PT evaluation pending  Gout flare Aspirated right knee fluid consistent with gout flare, although no WBC available. Orthopedic surgery injected lidocaine/solu-medrol on 10/14 -Continue Colchicine 0.6 mg daily; can continue a few days after flare has resolved -Analgesics prn  Leukocytosis Initial downtrend with spike this morning. Possibly related to recent steroid knee injection. No obvious signs for worsening infection. -CBC daily  Sepsis Did not meet criteria on admission but now has fever with associated leukocytosis. Blood cultures with no growth to date. Secondary to left foot infection. -Continue antibiotics as mentioned above  Prediabetes Hemoglobin A1C of 6.4%. Patient has been working on diet and weight loss. He reports about a 15 lb intentional weight loss in the last month. He is not agreeable to  considering metformin at this time. Will recommend continued diet/weight loss in addition to obtaining a PCP for continued monitoring/management. -SSI  CKD stage II Baseline creatinine of 1.1. Stable  Hyponatremia Mild. Asymptomatic. Improved.   DVT prophylaxis: Lovenox Code Status:   Code Status: Full Code Family Communication: None at bedside Disposition Plan: Discharge home likely in 2-5 days pending ID/podiatry recommendations   Consultants:  Orthopedic Rosanne Gutting) Podiatry Infectious disease  Procedures:  ARTHROCENTESIS (03/08/2021) KNEE INJECTION (03/08/2021)  Antimicrobials: Ceftriaxone IV Zosyn IV    Subjective: Knee pain is significantly improved. No other concerns.  Objective: Vitals:   03/09/21 0518 03/09/21 1430 03/09/21 2030 03/10/21 0447  BP: (!) 119/93 132/67 (!) 145/83 124/79  Pulse: 86 89 93 82  Resp: 20 19 20 16   Temp: 98.1 F (36.7 C) 98.9 F (37.2 C) 98.6 F (37 C) 98.5 F (36.9 C)  TempSrc: Oral Oral Oral Oral  SpO2: 98% 95% 95% 96%  Weight:      Height:        Intake/Output Summary (Last 24 hours) at 03/10/2021 1056 Last data filed at 03/10/2021 0457 Gross per 24 hour  Intake --  Output 950 ml  Net -950 ml    Filed Weights   03/07/21 1305  Weight: 106.6 kg    Examination:  General exam: Appears calm and comfortable  Respiratory system: Clear to auscultation. Respiratory effort normal. Cardiovascular system: S1 & S2 heard, RRR. No murmurs, rubs, gallops or clicks. Gastrointestinal system: Abdomen is nondistended, soft and nontender. No organomegaly or masses felt. Normal bowel sounds heard. Central nervous system: Alert and oriented. No focal neurological deficits. Musculoskeletal: No edema. No calf tenderness. Left knee with mild effusion and no tenderness or erythema. No erythema of bilateral feet Skin: No cyanosis.  No rashes Psychiatry: Judgement and insight appear normal. Mood & affect appropriate.     Data  Reviewed: I have personally reviewed following labs and imaging studies  CBC Lab Results  Component Value Date   WBC 24.4 (H) 03/10/2021   RBC 4.24 03/10/2021   HGB 13.1 03/10/2021   HCT 38.5 (L) 03/10/2021   MCV 90.8 03/10/2021   MCH 30.9 03/10/2021   PLT 357 03/10/2021   MCHC 34.0 03/10/2021   RDW 13.0 03/10/2021   LYMPHSABS 1.0 03/07/2021   MONOABS 2.1 (H) 03/07/2021   EOSABS 0.0 03/07/2021   BASOSABS 0.1 71/69/6789     Last metabolic panel Lab Results  Component Value Date   NA 133 (L) 03/09/2021   K 4.7 03/09/2021   CL 99 03/09/2021   CO2 24 03/09/2021   BUN 21 03/09/2021   CREATININE 1.28 (H) 03/09/2021   GLUCOSE 208 (H) 03/09/2021   GFRNONAA >60 03/09/2021   GFRAA 86 (L) 12/09/2012   CALCIUM 8.9 03/09/2021   PROT 7.3 03/09/2021   ALBUMIN 3.3 (L) 03/09/2021   BILITOT 1.0 03/09/2021   ALKPHOS 78 03/09/2021   AST 15 03/09/2021   ALT 17 03/09/2021   ANIONGAP 10 03/09/2021    CBG (last 3)  Recent Labs    03/09/21 1636 03/09/21 2035 03/10/21 0738  GLUCAP 174* 257* 235*      GFR: Estimated Creatinine Clearance: 72.4 mL/min (A) (by C-G formula based on SCr of 1.28 mg/dL (H)).  Coagulation Profile: No results for input(s): INR, PROTIME in the last 168 hours.  Recent Results (from the past 240 hour(s))  Blood culture (routine x 2)     Status: None (Preliminary result)   Collection Time: 03/07/21  2:02 PM   Specimen: Right Antecubital; Blood  Result Value Ref Range Status   Specimen Description   Final    RIGHT ANTECUBITAL Performed at [facility], [address] [place], [place] [address]    Special Requests   Final    BOTTLES DRAWN AEROBIC AND ANAEROBIC Blood Culture adequate volume Performed at [facility], [address] [place], [place] [address]    Culture   Final    NO GROWTH 3 DAYS Performed at [facility], [place] [address] [place], [place] [address]    Report Status PENDING  Incomplete  Blood culture  (routine x 2)     Status: None (Preliminary result)   Collection Time: 03/07/21  2:11 PM   Specimen: Left Antecubital; Blood  Result Value Ref Range Status   Specimen Description   Final    LEFT ANTECUBITAL Performed at [facility], [address] [place], [place] [address]    Special Requests   Final    BOTTLES DRAWN AEROBIC AND ANAEROBIC Blood Culture adequate volume Performed at [facility], [address] [place], [place] [address]    Culture   Final    NO GROWTH 3 DAYS Performed at [facility], [place] [address] [place], [place] [address]    Report Status PENDING  Incomplete  Resp Panel by RT-PCR (Flu A&B, Covid) Nasopharyngeal Swab     Status: None   Collection Time: 03/07/21  3:35 PM   Specimen: Nasopharyngeal Swab; Nasopharyngeal(NP) swabs in vial transport medium  Result Value Ref Range Status   SARS Coronavirus 2 by RT PCR NEGATIVE NEGATIVE Final    Comment: (NOTE) SARS-CoV-2 target nucleic acids are NOT DETECTED.  The SARS-CoV-2  RNA is generally detectable in upper respiratory specimens during the acute phase of infection. The lowest concentration of SARS-CoV-2 viral copies this assay can detect is 138 copies/mL. A negative result does not preclude SARS-Cov-2 infection and should not be used as the sole basis for treatment or other patient management decisions. A negative result may occur with  improper specimen collection/handling, submission of specimen other than nasopharyngeal swab, presence of viral mutation(s) within the areas targeted by this assay, and inadequate number of viral copies(<138 copies/mL). A negative result must be combined with clinical observations, patient history, and epidemiological information. The expected result is Negative.  Fact Sheet for Patients:  EntrepreneurPulse.com.au  Fact Sheet for Healthcare Providers:  IncredibleEmployment.be  This test is no t yet approved  or cleared by the [place] FDA and  has been authorized for detection and/or diagnosis of SARS-CoV-2 by FDA under an Emergency Use Authorization (EUA). This EUA will remain  in effect (meaning this test can be used) for the duration of the COVID-19 declaration under Section 564(b)(1) of the Act, 21 U.S.C.section 360bbb-3(b)(1), unless the authorization is terminated  or revoked sooner.       Influenza A by PCR NEGATIVE NEGATIVE Final   Influenza B by PCR NEGATIVE NEGATIVE Final    Comment: (NOTE) The Xpert Xpress SARS-CoV-2/FLU/RSV plus assay is intended as an aid in the diagnosis of influenza from Nasopharyngeal swab specimens and should not be used as a sole basis for treatment. Nasal washings and aspirates are unacceptable for Xpert Xpress SARS-CoV-2/FLU/RSV testing.  Fact Sheet for Patients: EntrepreneurPulse.com.au  Fact Sheet for Healthcare Providers: IncredibleEmployment.be  This test is not yet approved or cleared by the [place] FDA and has been authorized for detection and/or diagnosis of SARS-CoV-2 by FDA under an Emergency Use Authorization (EUA). This EUA will remain in effect (meaning this test can be used) for the duration of the COVID-19 declaration under Section 564(b)(1) of the Act, 21 U.S.C. section 360bbb-3(b)(1), unless the authorization is terminated or revoked.  Performed at [facility], [address] [place], [place] [address]   Body fluid culture w Gram Stain (indicate joint specimen source)     Status: None (Preliminary result)   Collection Time: 03/08/21  1:07 PM   Specimen: KNEE; Body Fluid  Result Value Ref Range Status   Specimen Description   Final    KNEE Performed at [facility], [place] [address], [place], [place] [address]    Special Requests   Final    Normal Performed at [facility], [place] [address], [place], [place] [address]    Gram  Stain   Final    ABUNDANT WBC PRESENT, PREDOMINANTLY PMN NO ORGANISMS SEEN    Culture   Final    NO GROWTH < 24 HOURS Performed at [facility] [address] [place], [place] [address]    Report Status PENDING  Incomplete         Radiology Studies: MR FOOT RIGHT WO CONTRAST  Result Date: 03/09/2021 CLINICAL DATA:  Right foot pain and swelling.  Cellulitis EXAM: MRI OF THE RIGHT FOREFOOT WITHOUT CONTRAST TECHNIQUE: Multiplanar, multisequence MR imaging of the right forefoot was performed. No intravenous contrast was administered. COMPARISON:  X-ray 03/07/2021 FINDINGS: Bones/Joint/Cartilage Extensive postsurgical changes throughout the right foot including bunionectomy and multiple hammertoe corrections. Multifocal areas of susceptibility artifact related to the prior surgeries. Chronic appearing deformity of the first metatarsal. Patchy bone marrow edema throughout the first  metatarsal head and neck as well as within the base of the great toe proximal phalanx, which is likely degenerative/reactive. Multifocal subchondral cystic changes along both sides of the joint. No obvious sites of acute bone destruction. No acute fracture. No dislocation. Advanced degenerative changes throughout the right foot, most pronounced at the first MTP joint. Ligaments Intact Lisfranc ligament.  Grossly intact collateral ligaments. Muscles and Tendons Chronic denervation changes of the intrinsic foot musculature. No apparent tenosynovial fluid collection. Soft tissues No focal soft tissue ulceration. Diffuse subcutaneous edema throughout the foot. No organized fluid collections. IMPRESSION: 1. Patchy bone marrow edema throughout the first metatarsal head and neck as well as within the base of the great toe proximal phalanx, which is likely degenerative/reactive. Changes related to a superimposed infectious osteitis are not entirely excluded. No obvious sites of acute bone destruction to suggest acute  osteomyelitis. 2. Diffuse subcutaneous edema throughout the foot, which may reflect cellulitis. No organized fluid collections. 3. Extensive postsurgical changes and advanced degenerative changes throughout the right foot. Electronically Signed   By: Davina Poke D.O.   On: 03/09/2021 12:29   MR FOOT LEFT WO CONTRAST  Result Date: 03/09/2021 CLINICAL DATA:  Left foot pain and swelling.  Cellulitis. EXAM: MRI OF THE LEFT FOOT WITHOUT CONTRAST TECHNIQUE: Multiplanar, multisequence MR imaging of the left forefoot was performed. No intravenous contrast was administered. COMPARISON:  X-ray 03/07/2021, 02/22/2021 FINDINGS: Bones/Joint/Cartilage Bone marrow edema with low T1 signal within the left third toe centered at the middle phalanx (series 8, images 15-16), suspicious for acute osteomyelitis. Multifocal areas of susceptibility artifact within the left foot including the first metatarsal, great toe proximal phalanx, second metatarsal and involving the second through fifth toes compatible with prior surgery. Additional areas of bone marrow edema including the third metatarsal head, third metatarsal base, and fourth metatarsal base are felt to represent reactive or stress related changes given the preservation of the T1 bone marrow signal. No acute fracture is identified. No dislocation. Advanced degenerative changes throughout the foot. Ligaments Intact Lisfranc ligament. Collateral ligaments remain grossly intact. Muscles and Tendons Chronic denervation changes of the intrinsic foot musculature. No significant tenosynovial fluid collection. Soft tissues Prominent soft tissue swelling of the third toe with edema. No well-defined erosion. Mild diffuse swelling throughout the forefoot. No organized fluid collection. IMPRESSION: 1. Bone marrow signal changes within the left third toe centered at the middle phalanx are suspicious for acute osteomyelitis. 2. Soft tissue swelling and probable cellulitis involving  the third toe. 3. Additional areas of bone marrow edema including the third metatarsal head, third metatarsal base, and fourth metatarsal base are felt to represent reactive or stress related changes. 4. Extensive postsurgical changes and advanced degenerative changes throughout the left foot. Electronically Signed   By: Davina Poke D.O.   On: 03/09/2021 12:23      Scheduled Meds:  colchicine  0.6 mg Oral Daily   enoxaparin (LOVENOX) injection  50 mg Subcutaneous Q24H   insulin aspart  0-15 Units Subcutaneous TID WC   Continuous Infusions:  cefTRIAXone (ROCEPHIN)  IV 2 g (03/09/21 2125)     LOS: 3 days     Cordelia Poche, MD [facility] 03/10/2021, 10:56 AM  If 7PM-7AM, please contact night-coverage [email or web address]

## 2021-03-11 ENCOUNTER — Other Ambulatory Visit (HOSPITAL_COMMUNITY): Payer: Self-pay

## 2021-03-11 LAB — GLUCOSE, CAPILLARY
Glucose-Capillary: 190 mg/dL — ABNORMAL HIGH (ref 70–99)
Glucose-Capillary: 228 mg/dL — ABNORMAL HIGH (ref 70–99)

## 2021-03-11 LAB — CBC
HCT: 39.3 % (ref 39.0–52.0)
Hemoglobin: 13.2 g/dL (ref 13.0–17.0)
MCH: 31.1 pg (ref 26.0–34.0)
MCHC: 33.6 g/dL (ref 30.0–36.0)
MCV: 92.5 fL (ref 80.0–100.0)
Platelets: 389 10*3/uL (ref 150–400)
RBC: 4.25 MIL/uL (ref 4.22–5.81)
RDW: 13.1 % (ref 11.5–15.5)
WBC: 20 10*3/uL — ABNORMAL HIGH (ref 4.0–10.5)
nRBC: 0 % (ref 0.0–0.2)

## 2021-03-11 LAB — BODY FLUID CULTURE W GRAM STAIN
Culture: NO GROWTH
Special Requests: NORMAL

## 2021-03-11 MED ORDER — OXYCODONE HCL 5 MG PO TABS: 5.0000 mg | ORAL_TABLET | Freq: Four times a day (QID) | ORAL | 0 refills | Status: DC | PRN

## 2021-03-11 MED ORDER — TERBINAFINE HCL 1 % EX CREA: TOPICAL_CREAM | Freq: Two times a day (BID) | CUTANEOUS | 0 refills | Status: AC

## 2021-03-11 MED ORDER — COLCHICINE 0.6 MG PO TABS: 0.6000 mg | ORAL_TABLET | Freq: Every day | ORAL | 0 refills | Status: DC

## 2021-03-11 MED ORDER — LINEZOLID 600 MG PO TABS: 600.0000 mg | ORAL_TABLET | Freq: Two times a day (BID) | ORAL | 0 refills | Status: AC

## 2021-03-11 NOTE — Discharge Instructions (Addendum)
Marc Krueger,  You were in the hospital with concern for a foot infection. You were started on antibiotics. While here, you developed worsening knee pain which turned out to be gout. You were treated with aspiration of fluid and injection of steroids. I will discharge you on colchicine. With regard to your left foot/toe, it seems you might have had an infection but the podiatrist believes your MRI results are related to gout rather than an infection in your bone. Please follow-up with a PCP.

## 2021-03-11 NOTE — Discharge Summary (Signed)
Physician Discharge Summary  Marc Krueger QBV:[id number] DOB: [birth year] DOA: 03/07/2021  PCP: Pcp, No  Admit date: 03/07/2021 Discharge date: 03/11/2021  Admitted From: Home Disposition: Home  Recommendations for Outpatient Follow-up:  Follow up with PCP in 1 week (appointment made) Please obtain BMP/CBC in one week Please follow up on the following pending results: None  Home Health: None Equipment/Devices: None  Discharge Condition: Stable CODE STATUS: Full code Diet recommendation: Regular diet, carb modified  Brief/Interim Summary:  Admission HPI written by Rise Patience, MD   HPI: Marc Krueger is a 65 y.o. male with no significant past medical history presents to the ER with persistent pain in the both feet for the last 3 to 4 weeks.  Patient states his symptoms started about 3 to 4 weeks ago when he had a pedicure done and started off on the third toe on the left foot with swelling and redness which moved proximally to the left foot.  Had come to the ER on February 22, 2021 about 2 weeks ago at that time patient CRP was around 79.  Uric acid was normal.  Patient was empirically treated by primary care physician for possible gout with prednisone.  Despite taking which patient's symptoms worsened and started involving the right foot now which is acutely worsened over the last 48 hours and decided to come to the ER.   Hospital course:  Cellulitis of bilateral feet Concern for possible osteomyelitis. Initial source appears to be left third toe. Associated fevers, chills, leukocytosis. CRP of 18.6 (down from 76.6 in September). Improving with IV antibiotics. Blood cultures no growth to date. MRI significant for features concerning for left third toe osteomyelitis. Podiatry consulted and, per their recommendations, unlikely osteomyelitis and more likely inflammation from gout. ID consulted and have recommended linezolid on discharge.   Gout flare Aspirated right  knee fluid consistent with gout flare, although no WBC available. Orthopedic surgery injected lidocaine/solu-medrol on 10/14. Colchicine started with continued improvement of symptoms. Discharged on colchicine.   Leukocytosis Initial downtrend with transient spike likely related to corticosteroid knee injection. Trended down prior to discharge.   Sepsis Did not meet criteria on admission but now has fever with associated leukocytosis. Blood cultures with no growth to date. Secondary to left foot infection.   Prediabetes Hemoglobin A1C of 6.4%. Patient has been working on diet and weight loss. He reports about a 15 lb intentional weight loss in the last month. He is not agreeable to considering metformin at this time. Will recommend continued diet/weight loss in addition to obtaining a PCP for continued monitoring/management.   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 Stable   Hyponatremia Mild. Asymptomatic. Improved.   Discharge Diagnoses:  Principal Problem:   Cellulitis of left foot Active Problems:   Cellulitis of foot   Sepsis ([place])   Effusion of right knee   Hyponatremia    Discharge Instructions   Allergies as of 03/11/2021   No Known Allergies      Medication List     STOP taking these medications    predniSONE 20 MG tablet Commonly known as: DELTASONE       TAKE these medications    acetaminophen 500 MG tablet Commonly known as: TYLENOL Take 500 mg by mouth every 6 (six) hours as needed for moderate pain.   colchicine 0.6 MG tablet Take 1 tablet (0.6 mg total) by mouth daily. Start taking on: March 12, 2021   FISH OIL PO Take 2 capsules by  mouth in the morning and at bedtime.   linezolid 600 MG tablet Commonly known as: ZYVOX Take 1 tablet (600 mg total) by mouth every 12 (twelve) hours for 6 days.   MULTIVITAMIN ADULTS PO Take 2 capsules by mouth in the morning and at bedtime.   oxyCODONE 5 MG immediate release tablet Commonly known as:  Oxy IR/ROXICODONE Take 1 tablet (5 mg total) by mouth every 6 (six) hours as needed for severe pain or moderate pain.   terbinafine 1 % cream Commonly known as: LAMISIL Apply topically 2 (two) times daily. Apply to foot        Follow-up [place]. Go on 03/14/2021.   Specialty: Internal Medicine Why: Thursday at 1:30 for your hospital follow up appointment.  This is next door to [facility].  Call if you need to reschedule Contact information: [place] [phone]               No Known Allergies  Consultations: Podiatry Infectious disease   Procedures/Studies: DG Chest 2 View  Result Date: 03/07/2021 CLINICAL DATA:  Lower extremity edema. EXAM: CHEST - 2 VIEW COMPARISON:  Chest x-ray 11/26/2018, CT chest 10/10/2014 FINDINGS: The heart and mediastinal contours are within normal limits. No focal consolidation. No pulmonary edema. No pleural effusion. No pneumothorax. No acute osseous abnormality. IMPRESSION: No active cardiopulmonary disease. Electronically Signed   By: Iven Finn M.D.   On: 03/07/2021 16:00   MR FOOT RIGHT WO CONTRAST  Result Date: 03/09/2021 CLINICAL DATA:  Right foot pain and swelling.  Cellulitis EXAM: MRI OF THE RIGHT FOREFOOT WITHOUT CONTRAST TECHNIQUE: Multiplanar, multisequence MR imaging of the right forefoot was performed. No intravenous contrast was administered. COMPARISON:  X-ray 03/07/2021 FINDINGS: Bones/Joint/Cartilage Extensive postsurgical changes throughout the right foot including bunionectomy and multiple hammertoe corrections. Multifocal areas of susceptibility artifact related to the prior surgeries. Chronic appearing deformity of the first metatarsal. Patchy bone marrow edema throughout the first metatarsal head and neck as well as within the base of the great toe proximal phalanx, which is likely degenerative/reactive. Multifocal  subchondral cystic changes along both sides of the joint. No obvious sites of acute bone destruction. No acute fracture. No dislocation. Advanced degenerative changes throughout the right foot, most pronounced at the first MTP joint. Ligaments Intact Lisfranc ligament.  Grossly intact collateral ligaments. Muscles and Tendons Chronic denervation changes of the intrinsic foot musculature. No apparent tenosynovial fluid collection. Soft tissues No focal soft tissue ulceration. Diffuse subcutaneous edema throughout the foot. No organized fluid collections. IMPRESSION: 1. Patchy bone marrow edema throughout the first metatarsal head and neck as well as within the base of the great toe proximal phalanx, which is likely degenerative/reactive. Changes related to a superimposed infectious osteitis are not entirely excluded. No obvious sites of acute bone destruction to suggest acute osteomyelitis. 2. Diffuse subcutaneous edema throughout the foot, which may reflect cellulitis. No organized fluid collections. 3. Extensive postsurgical changes and advanced degenerative changes throughout the right foot. Electronically Signed   By: Davina Poke D.O.   On: 03/09/2021 12:29   MR FOOT LEFT WO CONTRAST  Result Date: 03/09/2021 CLINICAL DATA:  Left foot pain and swelling.  Cellulitis. EXAM: MRI OF THE LEFT FOOT WITHOUT CONTRAST TECHNIQUE: Multiplanar, multisequence MR imaging of the left forefoot was performed. No intravenous contrast was administered. COMPARISON:  X-ray 03/07/2021, 02/22/2021 FINDINGS: Bones/Joint/Cartilage Bone marrow edema with low T1 signal within  the left third toe centered at the middle phalanx (series 8, images 15-16), suspicious for acute osteomyelitis. Multifocal areas of susceptibility artifact within the left foot including the first metatarsal, great toe proximal phalanx, second metatarsal and involving the second through fifth toes compatible with prior surgery. Additional areas of bone marrow  edema including the third metatarsal head, third metatarsal base, and fourth metatarsal base are felt to represent reactive or stress related changes given the preservation of the T1 bone marrow signal. No acute fracture is identified. No dislocation. Advanced degenerative changes throughout the foot. Ligaments Intact Lisfranc ligament. Collateral ligaments remain grossly intact. Muscles and Tendons Chronic denervation changes of the intrinsic foot musculature. No significant tenosynovial fluid collection. Soft tissues Prominent soft tissue swelling of the third toe with edema. No well-defined erosion. Mild diffuse swelling throughout the forefoot. No organized fluid collection. IMPRESSION: 1. Bone marrow signal changes within the left third toe centered at the middle phalanx are suspicious for acute osteomyelitis. 2. Soft tissue swelling and probable cellulitis involving the third toe. 3. Additional areas of bone marrow edema including the third metatarsal head, third metatarsal base, and fourth metatarsal base are felt to represent reactive or stress related changes. 4. Extensive postsurgical changes and advanced degenerative changes throughout the left foot. Electronically Signed   By: Davina Poke D.O.   On: 03/09/2021 12:23   DG Foot Complete Left  Result Date: 03/07/2021 CLINICAL DATA:  Toe pain.  Foot swelling. EXAM: LEFT FOOT - COMPLETE 3+ VIEW COMPARISON:  02/22/21 FINDINGS: There is mild diffuse osteopenia. Postoperative changes from previous repair of hallux valgus deformity noted. Chronic deformities involving the distal aspect of the first, second, third, and fifth metatarsal bones appears stable from the previous exam. Arthro pathic changes are noted involving the PIP joints of the second, third, fourth and fifth digits. There are no signs of acute fracture or dislocation. No focal bone erosions to suggest osteomyelitis. Dorsal soft tissue swelling is identified overlying the metatarsal bones.  IMPRESSION: 1. No acute bone abnormality. 2. Chronic deformities involve the first, second, third, and fifth metatarsal bones. 3. Dorsal soft tissue swelling. Electronically Signed   By: Kerby Moors M.D.   On: 03/07/2021 14:46   DG Foot Complete Left  Result Date: 02/22/2021 CLINICAL DATA:  Left foot pain and swelling, no known injury, gout EXAM: LEFT FOOT - COMPLETE 3+ VIEW COMPARISON:  None. FINDINGS: No fracture or dislocation of the left foot. Postoperative findings of bunion and hammertoe osteotomy. Probable chronic fracture deformity of the distal left fifth metatarsal. Mild arthrosis of the metatarsophalangeal joints and midfoot. Diffuse soft tissue edema about the forefoot. IMPRESSION: 1.  No fracture or dislocation of the left foot. 2.  No radiographic evidence of erosive arthropathy. 3.  Diffuse soft tissue edema about the forefoot. Electronically Signed   By: Delanna Ahmadi M.D.   On: 02/22/2021 16:11   DG Foot Complete Right  Result Date: 03/07/2021 CLINICAL DATA:  Toe pain.  Cellulitis EXAM: RIGHT FOOT COMPLETE - 3+ VIEW COMPARISON:  None. FINDINGS: Soft tissue swelling medial foot. Negative for fracture or osteomyelitis Prior foot surgery. Prior bunionectomy. There is extensive irregularity of the first metatarsal which could be due to chronic osteomyelitis which has healed. Moderate degenerative change in spurring in the first MTP joint. Surgical wires overlying the first second and third metatarsals and first proximal phalanx. Widening of the joint space of the second PIP joint with possible prosthesis. Similar findings in the third PIP joint. Correlate with  prior history. IMPRESSION: Medial soft tissue swelling. Negative for acute osteomyelitis or fracture. Prior bunionectomy and surgery in the foot. Extensive cortical irregularity of the first metatarsal may be due to prior surgery or chronic osteomyelitis. Electronically Signed   By: Franchot Gallo M.D.   On: 03/07/2021 14:48      Subjective: No issues this morning. Able to ambulate better today.  Discharge Exam: Vitals:   03/10/21 2048 03/11/21 0453  BP: 129/70 129/71  Pulse: 78 62  Resp: 20 20  Temp: 98.7 F (37.1 C) 97.8 F (36.6 C)  SpO2: 94% 96%   Vitals:   03/10/21 0447 03/10/21 1509 03/10/21 2048 03/11/21 0453  BP: 124/79 139/78 129/70 129/71  Pulse: 82 73 78 62  Resp: 16 18 20 20   Temp: 98.5 F (36.9 C) 97.7 F (36.5 C) 98.7 F (37.1 C) 97.8 F (36.6 C)  TempSrc: Oral Oral Oral Oral  SpO2: 96% 95% 94% 96%  Weight:      Height:        General: Pt is alert, awake, not in acute distress Cardiovascular: RRR, S1/S2 +, no rubs, no gallops Respiratory: CTA bilaterally, no wheezing, no rhonchi Abdominal: Soft, NT, ND, bowel sounds + Extremities: Some right foot/ankle edema, no erythema. Right knee with mild effusion, no tenderness and no erythema    The results of significant diagnostics from this hospitalization (including imaging, microbiology, ancillary and laboratory) are listed below for reference.     Microbiology: Recent Results (from the past 240 hour(s))  Blood culture (routine x 2)     Status: None (Preliminary result)   Collection Time: 03/07/21  2:02 PM   Specimen: Right Antecubital; Blood  Result Value Ref Range Status   Specimen Description   Final    RIGHT ANTECUBITAL Performed at [facility], [address] [place], [place] [address]    Special Requests   Final    BOTTLES DRAWN AEROBIC AND ANAEROBIC Blood Culture adequate volume Performed at [facility], [address] [place], [place] [address]    Culture   Final    NO GROWTH 4 DAYS Performed at [facility], [place] [address] [place], [place] [address]    Report Status PENDING  Incomplete  Blood culture (routine x 2)     Status: None (Preliminary result)   Collection Time: 03/07/21  2:11 PM   Specimen: Left Antecubital; Blood  Result Value Ref Range Status   Specimen  Description   Final    LEFT ANTECUBITAL Performed at [facility], [address] [place], [place] [address]    Special Requests   Final    BOTTLES DRAWN AEROBIC AND ANAEROBIC Blood Culture adequate volume Performed at [facility], [address] [place], [place] [address]    Culture   Final    NO GROWTH 4 DAYS Performed at [facility], [place] [address] [place], [place] [address]    Report Status PENDING  Incomplete  Resp Panel by RT-PCR (Flu A&B, Covid) Nasopharyngeal Swab     Status: None   Collection Time: 03/07/21  3:35 PM   Specimen: Nasopharyngeal Swab; Nasopharyngeal(NP) swabs in vial transport medium  Result Value Ref Range Status   SARS Coronavirus 2 by RT PCR NEGATIVE NEGATIVE Final    Comment: (NOTE) SARS-CoV-2 target nucleic acids are NOT DETECTED.  The SARS-CoV-2 RNA is generally detectable in upper respiratory specimens during the acute phase of infection. The  lowest concentration of SARS-CoV-2 viral copies this assay can detect is 138 copies/mL. A negative result does not preclude SARS-Cov-2 infection and should not be used as the sole basis for treatment or other patient management decisions. A negative result may occur with  improper specimen collection/handling, submission of specimen other than nasopharyngeal swab, presence of viral mutation(s) within the areas targeted by this assay, and inadequate number of viral copies(<138 copies/mL). A negative result must be combined with clinical observations, patient history, and epidemiological information. The expected result is Negative.  Fact Sheet for Patients:  EntrepreneurPulse.com.au  Fact Sheet for Healthcare Providers:  IncredibleEmployment.be  This test is no t yet approved or cleared by the [place] FDA and  has been authorized for detection and/or diagnosis of SARS-CoV-2 by FDA under an Emergency Use Authorization (EUA). This EUA  will remain  in effect (meaning this test can be used) for the duration of the COVID-19 declaration under Section 564(b)(1) of the Act, 21 U.S.C.section 360bbb-3(b)(1), unless the authorization is terminated  or revoked sooner.       Influenza A by PCR NEGATIVE NEGATIVE Final   Influenza B by PCR NEGATIVE NEGATIVE Final    Comment: (NOTE) The Xpert Xpress SARS-CoV-2/FLU/RSV plus assay is intended as an aid in the diagnosis of influenza from Nasopharyngeal swab specimens and should not be used as a sole basis for treatment. Nasal washings and aspirates are unacceptable for Xpert Xpress SARS-CoV-2/FLU/RSV testing.  Fact Sheet for Patients: EntrepreneurPulse.com.au  Fact Sheet for Healthcare Providers: IncredibleEmployment.be  This test is not yet approved or cleared by the [place] FDA and has been authorized for detection and/or diagnosis of SARS-CoV-2 by FDA under an Emergency Use Authorization (EUA). This EUA will remain in effect (meaning this test can be used) for the duration of the COVID-19 declaration under Section 564(b)(1) of the Act, 21 U.S.C. section 360bbb-3(b)(1), unless the authorization is terminated or revoked.  Performed at [facility], [address] [place], [place] [address]   Body fluid culture w Gram Stain (indicate joint specimen source)     Status: None (Preliminary result)   Collection Time: 03/08/21  1:07 PM   Specimen: KNEE; Body Fluid  Result Value Ref Range Status   Specimen Description   Final    KNEE Performed at [facility], [place] [address], [place], [place] [address]    Special Requests   Final    Normal Performed at [facility], [place] [address], [place],  [address]    Gram Stain   Final    ABUNDANT WBC PRESENT, PREDOMINANTLY PMN NO ORGANISMS SEEN    Culture   Final    NO GROWTH 3 DAYS Performed at [facility], [place] [address] [place],  [address]    Report Status PENDING  Incomplete     Labs: BNP (last 3 results) Recent Labs    03/07/21 1348  BNP 448.1*   Basic Metabolic Panel: Recent Labs  Lab 03/07/21 1348 03/08/21 0442 03/09/21 0521  NA 133* 129* 133*  K 4.6 4.0 4.7  CL 97* 98 99  CO2 25 22 24   GLUCOSE 187* 213* 208*  BUN 21 18 21   CREATININE 1.30* 1.05 1.28*  CALCIUM 8.9 8.4* 8.9   Liver Function Tests: Recent Labs  Lab 03/07/21 1348 03/09/21 0521  AST 16 15  ALT 17 17  ALKPHOS 82 78  BILITOT 1.7* 1.0  PROT 7.7 7.3  ALBUMIN 3.8 3.3*  No results for input(s): LIPASE, AMYLASE in the last 168 hours. No results for input(s): AMMONIA in the last 168 hours. CBC: Recent Labs  Lab 03/07/21 1348 03/08/21 0442 03/09/21 0521 03/10/21 0434 03/11/21 0512  WBC 20.6* 18.2* 16.9* 24.4* 20.0*  NEUTROABS 17.3*  --   --   --   --   HGB 14.3 13.3 13.9 13.1 13.2  HCT 41.5 38.0* 41.9 38.5* 39.3  MCV 90.6 91.1 93.7 90.8 92.5  PLT 366 286 300 357 389   Cardiac Enzymes: No results for input(s): CKTOTAL, CKMB, CKMBINDEX, TROPONINI in the last 168 hours. BNP: Invalid input(s): POCBNP CBG: Recent Labs  Lab 03/10/21 0738 03/10/21 1154 03/10/21 1658 03/10/21 2051 03/11/21 0736  GLUCAP 235* 307* 170* 257* 190*   D-Dimer No results for input(s): DDIMER in the last 72 hours. Hgb A1c No results for input(s): HGBA1C in the last 72 hours. Lipid Profile No results for input(s): CHOL, HDL, LDLCALC, TRIG, CHOLHDL, LDLDIRECT in the last 72 hours. Thyroid function studies No results for input(s): TSH, T4TOTAL, T3FREE, THYROIDAB in the last 72 hours.  Invalid input(s): FREET3 Anemia work up No results for input(s): VITAMINB12, FOLATE, FERRITIN, TIBC, IRON, RETICCTPCT in the last 72 hours. Urinalysis    Component Value Date/Time   COLORURINE AMBER (A) 03/07/2021 1616   APPEARANCEUR CLEAR 03/07/2021 1616   LABSPEC 1.025 03/07/2021 1616   PHURINE 5.5 03/07/2021 1616   GLUCOSEU 100 (A)  03/07/2021 1616   HGBUR SMALL (A) 03/07/2021 1616   BILIRUBINUR NEGATIVE 03/07/2021 1616   KETONESUR NEGATIVE 03/07/2021 1616   PROTEINUR NEGATIVE 03/07/2021 1616   UROBILINOGEN 0.2 12/02/2012 0910   NITRITE NEGATIVE 03/07/2021 1616   LEUKOCYTESUR NEGATIVE 03/07/2021 1616   Sepsis Labs Invalid input(s): PROCALCITONIN,  WBC,  LACTICIDVEN Microbiology Recent Results (from the past 240 hour(s))  Blood culture (routine x 2)     Status: None (Preliminary result)   Collection Time: 03/07/21  2:02 PM   Specimen: Right Antecubital; Blood  Result Value Ref Range Status   Specimen Description   Final    RIGHT ANTECUBITAL Performed at [facility], [address] [place], [place] [address]    Special Requests   Final    BOTTLES DRAWN AEROBIC AND ANAEROBIC Blood Culture adequate volume Performed at [facility], [address] [place], [place] [address]    Culture   Final    NO GROWTH 4 DAYS Performed at [facility], [place] [address], [place], [place] [address]    Report Status PENDING  Incomplete  Blood culture (routine x 2)     Status: None (Preliminary result)   Collection Time: 03/07/21  2:11 PM   Specimen: Left Antecubital; Blood  Result Value Ref Range Status   Specimen Description   Final    LEFT ANTECUBITAL Performed at [facility], [address] [place], [place] [address]    Special Requests   Final    BOTTLES DRAWN AEROBIC AND ANAEROBIC Blood Culture adequate volume Performed at [facility], [address] [place], [place] [address]    Culture   Final    NO GROWTH 4 DAYS Performed at [facility], [place] [address], [place], [place] [address]    Report Status PENDING  Incomplete  Resp Panel by RT-PCR (Flu A&B, Covid) Nasopharyngeal Swab     Status: None   Collection Time: 03/07/21  3:35 PM   Specimen: Nasopharyngeal Swab; Nasopharyngeal(NP) swabs  in vial transport medium  Result Value Ref Range Status   SARS  Coronavirus 2 by RT PCR NEGATIVE NEGATIVE Final    Comment: (NOTE) SARS-CoV-2 target nucleic acids are NOT DETECTED.  The SARS-CoV-2 RNA is generally detectable in upper respiratory specimens during the acute phase of infection. The lowest concentration of SARS-CoV-2 viral copies this assay can detect is 138 copies/mL. A negative result does not preclude SARS-Cov-2 infection and should not be used as the sole basis for treatment or other patient management decisions. A negative result may occur with  improper specimen collection/handling, submission of specimen other than nasopharyngeal swab, presence of viral mutation(s) within the areas targeted by this assay, and inadequate number of viral copies(<138 copies/mL). A negative result must be combined with clinical observations, patient history, and epidemiological information. The expected result is Negative.  Fact Sheet for Patients:  EntrepreneurPulse.com.au  Fact Sheet for Healthcare Providers:  IncredibleEmployment.be  This test is no t yet approved or cleared by the [place] FDA and  has been authorized for detection and/or diagnosis of SARS-CoV-2 by FDA under an Emergency Use Authorization (EUA). This EUA will remain  in effect (meaning this test can be used) for the duration of the COVID-19 declaration under Section 564(b)(1) of the Act, 21 U.S.C.section 360bbb-3(b)(1), unless the authorization is terminated  or revoked sooner.       Influenza A by PCR NEGATIVE NEGATIVE Final   Influenza B by PCR NEGATIVE NEGATIVE Final    Comment: (NOTE) The Xpert Xpress SARS-CoV-2/FLU/RSV plus assay is intended as an aid in the diagnosis of influenza from Nasopharyngeal swab specimens and should not be used as a sole basis for treatment. Nasal washings and aspirates are unacceptable for Xpert Xpress SARS-CoV-2/FLU/RSV testing.  Fact Sheet for  Patients: EntrepreneurPulse.com.au  Fact Sheet for Healthcare Providers: IncredibleEmployment.be  This test is not yet approved or cleared by the [place] FDA and has been authorized for detection and/or diagnosis of SARS-CoV-2 by FDA under an Emergency Use Authorization (EUA). This EUA will remain in effect (meaning this test can be used) for the duration of the COVID-19 declaration under Section 564(b)(1) of the Act, 21 U.S.C. section 360bbb-3(b)(1), unless the authorization is terminated or revoked.  Performed at [facility], [address] [place], [place] [address]   Body fluid culture w Gram Stain (indicate joint specimen source)     Status: None (Preliminary result)   Collection Time: 03/08/21  1:07 PM   Specimen: KNEE; Body Fluid  Result Value Ref Range Status   Specimen Description   Final    KNEE Performed at [facility], [place] [address], [place], [place] [address]    Special Requests   Final    Normal Performed at [facility], [place] [address] [place], [place] [address]    Gram Stain   Final    ABUNDANT WBC PRESENT, PREDOMINANTLY PMN NO ORGANISMS SEEN    Culture   Final    NO GROWTH 3 DAYS Performed at [facility], [place] [address], [place], [place] [address]    Report Status PENDING  Incomplete     Time coordinating discharge: 35 minutes  SIGNED:   Cordelia Poche, MD [facility] 03/11/2021, 11:34 AM

## 2021-03-11 NOTE — TOC Transition Note (Signed)
Transition of Care [facility]) - CM/SW Discharge Note   Patient Details  Name: Jasani Dolney MRN: [id number] Date of Birth: [birth year]  Transition of Care [facility]) CM/SW Contact:  Trish Mage, LCSW Phone Number: 03/11/2021, 10:11 AM   Clinical Narrative:   Secured an appointment for patient per MD request.  Thursday at [facility]. TOC will continue to follow during the course of hospitalization.     Final next level of care: Home/Self Care Barriers to Discharge: No Barriers Identified   Patient Goals and CMS Choice        Discharge Placement                       Discharge Plan and Services                                     Social Determinants of Health (SDOH) Interventions     Readmission Risk Interventions No flowsheet data found.

## 2021-03-12 LAB — CULTURE, BLOOD (ROUTINE X 2)
Culture: NO GROWTH
Culture: NO GROWTH
Special Requests: ADEQUATE
Special Requests: ADEQUATE

## 2021-03-14 ENCOUNTER — Encounter: Payer: Self-pay | Admitting: Nurse Practitioner

## 2021-03-14 ENCOUNTER — Other Ambulatory Visit: Payer: Self-pay

## 2021-03-14 ENCOUNTER — Ambulatory Visit (INDEPENDENT_AMBULATORY_CARE_PROVIDER_SITE_OTHER): Payer: Self-pay | Admitting: Nurse Practitioner

## 2021-03-14 VITALS — BP 144/90 | HR 96 | Temp 98.1°F | Ht 71.0 in | Wt 215.0 lb

## 2021-03-14 DIAGNOSIS — M25561 Pain in right knee: Secondary | ICD-10-CM

## 2021-03-14 DIAGNOSIS — L03116 Cellulitis of left lower limb: Secondary | ICD-10-CM

## 2021-03-14 DIAGNOSIS — M109 Gout, unspecified: Secondary | ICD-10-CM

## 2021-03-14 DIAGNOSIS — R7303 Prediabetes: Secondary | ICD-10-CM

## 2021-03-14 DIAGNOSIS — Z0000 Encounter for general adult medical examination without abnormal findings: Secondary | ICD-10-CM

## 2021-03-14 LAB — POCT URINALYSIS DIP (CLINITEK)
Bilirubin, UA: NEGATIVE
Blood, UA: NEGATIVE
Glucose, UA: NEGATIVE mg/dL
Ketones, POC UA: NEGATIVE mg/dL
Leukocytes, UA: NEGATIVE
Nitrite, UA: NEGATIVE
POC PROTEIN,UA: NEGATIVE
Spec Grav, UA: >=1.03 — AB (ref 1.010–1.025)
Urobilinogen, UA: 0.2 E.U./dL
pH, UA: 5 (ref 5.0–8.0)

## 2021-03-14 LAB — GLUCOSE, POCT (MANUAL RESULT ENTRY): POC Glucose: 216 mg/dl — AB (ref 70–99)

## 2021-03-14 NOTE — Patient Instructions (Signed)
You were seen today in the [facility] to establish care. Labs were collected, results will be available via MyChart or, if abnormal, you will be contacted by clinic staff. Please follow up in 6 mths for reevaluation.

## 2021-03-14 NOTE — Progress Notes (Signed)
[name], [place]  [address] Phone:  [phone]   Fax:  [phone] Subjective:   Patient ID: Marc Krueger, male    DOB: 06/16/1955, 65 y.o.   MRN: [id number]  Chief Complaint  Patient presents with   [facility] admission 03/07/2021 - 03/11/2021; Cellulitis of left foot A1C 03/08/21: 6.4   HPI Marc Krueger 65 y.o. male with history of  has a past medical history of Allergy (11-23-1012), Arthritis, and Sinusitis. To the [facility] for follow up s/p ED evaluation and admission.  Patient states that he was evaluated and admitted for bilateral foot pain on 03/07/21. Was diagnosed with gout and infection. Prior to ED visit, had pain in bilateral x 4 wks. Pain began in the left 3rd toe, progressed to the left foot, right foot and right knee. Endorses having pain with swelling and initially being unable to ambulate without usage of a cane.  Symptoms have subsided since discharge and he is able to ambulate without usage of assistive device. Continues to take prescribed medications and adhere to appropriate diet. Denies any exercise regimen, considering options when symptoms completely subside. Has some mild pain in right knee at site of knee aspiration. Denies any right knee swelling. Rates pain 3/10 and describes as aching, only occurring with weightbearing. Denies taking any medications for pain since discharge. Has lost 20 lbs over the past months due to diet changes. Currently works as a Engineer, drilling.  When questioned about B/P, states that he does have some anxiety visiting the office today. Last visit with a PCP several years ago. Denies any other symptoms during visit today. Denies any fever. Denies any fatigue, chest pain, shortness of breath, HA or dizziness. Denies any blurred vision, numbness or tingling.   Past Medical History:  Diagnosis Date   Allergy 11-23-1012   seasonal alllergy   Arthritis    osteoarthritis-knees    Sinusitis    occ./occ. 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   Procedure: LEFT TOTAL KNEE ARTHROPLASTY;  Surgeon: Mauri Pole, MD;  Location: [facility];  Service: Orthopedics;  Laterality: Left;    Family History  Problem Relation Age of Onset   Arthritis Mother    Heart disease Mother    Diabetes Mother    Lung cancer Sister    Arthritis Brother    Diabetes Brother    Arthritis Brother     Social History   Socioeconomic History   Marital status: Married    Spouse name: Not on file   Number of children: Not on file   Years of education: Not on file   Highest education level: Not on file  Occupational History   Not on file  Tobacco Use   Smoking status: Never   Smokeless tobacco: Never  Vaping Use   Vaping Use: Never used  Substance and Sexual Activity   Alcohol use: Not Currently    Comment: 1 per week   Drug use: No   Sexual activity: Yes  Other Topics Concern   Not on file  Social History Narrative   Not on file   Social Determinants of Health   Financial Resource Strain: Not on file  Food Insecurity: Not on  file  Transportation Needs: Not on file  Physical Activity: Not on file  Stress: Not on file  Social Connections: Not on file  Intimate Partner Violence: Not on file    Outpatient Medications Prior to Visit  Medication Sig Dispense Refill   acetaminophen (TYLENOL) 500 MG tablet Take 500 mg by mouth every 6 (six) hours as needed for moderate pain.     colchicine 0.6 MG tablet Take 1 tablet (0.6 mg total) by mouth daily. 30 tablet 0   linezolid (ZYVOX) 600 MG tablet Take 1 tablet (600 mg total) by mouth every 12 (twelve) hours for 6 days. 11 tablet 0   oxyCODONE (OXY IR/ROXICODONE) 5 MG immediate release tablet Take 1 tablet (5 mg total) by mouth every 6 (six) hours as  needed for severe pain or moderate pain. 20 tablet 0   terbinafine (LAMISIL) 1 % cream Apply topically 2 (two) times daily. Apply to foot 30 g 0   Multiple Vitamins-Minerals (MULTIVITAMIN ADULTS PO) Take 2 capsules by mouth in the morning and at bedtime. (Patient not taking: Reported on 03/14/2021)     Omega-3 Fatty Acids (FISH OIL PO) Take 2 capsules by mouth in the morning and at bedtime. (Patient not taking: Reported on 03/14/2021)     No facility-administered medications prior to visit.    No Known Allergies  Review of Systems  Constitutional:  Negative for chills, fever and malaise/fatigue.  HENT: Negative.    Eyes: Negative.   Respiratory:  Negative for cough and shortness of breath.   Cardiovascular:  Negative for chest pain, palpitations and leg swelling.  Gastrointestinal:  Negative for abdominal pain, blood in stool, constipation, diarrhea, nausea and vomiting.  Musculoskeletal:  Positive for joint pain.  Skin: Negative.   Neurological: Negative.   Psychiatric/Behavioral:  Negative for depression. The patient is not nervous/anxious.   All other systems reviewed and are negative.     Objective:    Physical Exam Vitals reviewed.  Constitutional:      General: He is not in acute distress.    Appearance: Normal appearance.  HENT:     Head: Normocephalic.     Right Ear: Tympanic membrane, ear canal and external ear normal.     Left Ear: Tympanic membrane, ear canal and external ear normal.     Nose: Nose normal.     Mouth/Throat:     Mouth: Mucous membranes are moist.     Pharynx: Oropharynx is clear.  Eyes:     Extraocular Movements: Extraocular movements intact.     Conjunctiva/sclera: Conjunctivae normal.     Pupils: Pupils are equal, round, and reactive to light.  Cardiovascular:     Rate and Rhythm: Normal rate and regular rhythm.     Pulses: Normal pulses.     Heart sounds: Normal heart sounds.     Comments: No obvious peripheral edema Pulmonary:      Effort: Pulmonary effort is normal.     Breath sounds: Normal breath sounds.  Musculoskeletal:        General: No swelling, tenderness, deformity or signs of injury. Normal range of motion.     Cervical back: Normal range of motion and neck supple.     Right lower leg: No edema.     Left lower leg: No edema.  Skin:    General: Skin is warm and dry.     Capillary Refill: Capillary refill takes less than 2 seconds.     Comments: Mild erythema noted to diffuse dorsal portion  of left foot, consistent with history of cellulitis  Neurological:     General: No focal deficit present.     Mental Status: He is alert and oriented to person, place, and time.  Psychiatric:        Mood and Affect: Mood normal.        Behavior: Behavior normal.        Thought Content: Thought content normal.        Judgment: Judgment normal.    BP (!) 144/90   Pulse 96   Temp 98.1 F (36.7 C)   Ht $R'5\' 11"'hr$  (1.803 m)   Wt 215 lb 0.2 oz (97.5 kg)   SpO2 99%   BMI 29.99 kg/m  Wt Readings from Last 3 Encounters:  03/14/21 215 lb 0.2 oz (97.5 kg)  03/07/21 235 lb 0.2 oz (106.6 kg)  03/07/21 235 lb 0.2 oz (106.6 kg)    Immunization History  Administered Date(s) Administered   Influenza Split 03/19/2012   Influenza,inj,Quad PF,6+ Mos 03/11/2013, 02/23/2014   Moderna Sars-Covid-2 Vaccination 11/09/2019, 12/08/2019   Tdap 03/19/2012    Diabetic Foot Exam - Simple   No data filed     No results found for: TSH Lab Results  Component Value Date   WBC 20.0 (H) 03/11/2021   HGB 13.2 03/11/2021   HCT 39.3 03/11/2021   MCV 92.5 03/11/2021   PLT 389 03/11/2021   Lab Results  Component Value Date   NA 133 (L) 03/09/2021   K 4.7 03/09/2021   CO2 24 03/09/2021   GLUCOSE 208 (H) 03/09/2021   BUN 21 03/09/2021   CREATININE 1.28 (H) 03/09/2021   BILITOT 1.0 03/09/2021   ALKPHOS 78 03/09/2021   AST 15 03/09/2021   ALT 17 03/09/2021   PROT 7.3 03/09/2021   ALBUMIN 3.3 (L) 03/09/2021   CALCIUM 8.9  03/09/2021   ANIONGAP 10 03/09/2021   EGFR 58 (L) 02/22/2021   Lab Results  Component Value Date   CHOL 177 03/19/2012   Lab Results  Component Value Date   HDL 37.60 (L) 03/19/2012   Lab Results  Component Value Date   LDLCALC 112 (H) 03/19/2012   Lab Results  Component Value Date   TRIG 139.0 03/19/2012   Lab Results  Component Value Date   CHOLHDL 5 03/19/2012   Lab Results  Component Value Date   HGBA1C 6.4 (H) 03/08/2021   HGBA1C 5.2 03/19/2012       Assessment & Plan:   Problem List Items Addressed This Visit       Musculoskeletal and Integument   Cellulitis of left foot Patient informed to complete antibiotics as prescribed  Continue to monitor for any changes in symptoms   Other Visit Diagnoses     Prediabetes    -  Primary   Relevant Orders   POCT URINALYSIS DIP (CLINITEK) (Completed)   Glucose (CBG) (Completed)   Healthcare maintenance       Relevant Orders   CBC with Differential/Platelet   Comprehensive metabolic panel   Lipid panel Encouraged continued diet and exercise efforts  Encouraged continued compliance with medication  Discussed at length different exercise options with arthritis     Acute gout, unspecified cause, unspecified site     Encouraged to adhere to previously discussed diet to assist in prevent gout flare Continue taking medications as previously prescribed   Acute pain of right knee     Discussed red flag symptoms that require further evaluation in the [facility] and/ ED Informed  to notify the clinic, if pain worsens or remains unresolved in 1-2 wks   Follow up in 6 mths for reevaluation of symptoms, sooner as needed    I am having Fuller Song maintain his acetaminophen, Omega-3 Fatty Acids (FISH OIL PO), Multiple Vitamins-Minerals (MULTIVITAMIN ADULTS PO), colchicine, linezolid, oxyCODONE, and terbinafine.  No orders of the defined types were placed in this encounter.    Teena Dunk, NP

## 2021-03-15 LAB — CBC WITH DIFFERENTIAL/PLATELET
Basophils Absolute: 0 10*3/uL (ref 0.0–0.2)
Basos: 0 %
EOS (ABSOLUTE): 0.2 10*3/uL (ref 0.0–0.4)
Eos: 2 %
Hematocrit: 46.8 % (ref 37.5–51.0)
Hemoglobin: 15.4 g/dL (ref 13.0–17.7)
Immature Grans (Abs): 0.1 10*3/uL (ref 0.0–0.1)
Immature Granulocytes: 1 %
Lymphocytes Absolute: 1.8 10*3/uL (ref 0.7–3.1)
Lymphs: 16 %
MCH: 30.5 pg (ref 26.6–33.0)
MCHC: 32.9 g/dL (ref 31.5–35.7)
MCV: 93 fL (ref 79–97)
Monocytes Absolute: 0.8 10*3/uL (ref 0.1–0.9)
Monocytes: 7 %
Neutrophils Absolute: 8.5 10*3/uL — ABNORMAL HIGH (ref 1.4–7.0)
Neutrophils: 74 %
Platelets: 471 10*3/uL — ABNORMAL HIGH (ref 150–450)
RBC: 5.05 x10E6/uL (ref 4.14–5.80)
RDW: 12.5 % (ref 11.6–15.4)
WBC: 11.5 10*3/uL — ABNORMAL HIGH (ref 3.4–10.8)

## 2021-03-15 LAB — LIPID PANEL
Chol/HDL Ratio: 4.8 ratio (ref 0.0–5.0)
Cholesterol, Total: 171 mg/dL (ref 100–199)
HDL: 36 mg/dL — ABNORMAL LOW (ref >39)
LDL Chol Calc (NIH): 109 mg/dL — ABNORMAL HIGH (ref 0–99)
Triglycerides: 148 mg/dL (ref 0–149)
VLDL Cholesterol Cal: 26 mg/dL (ref 5–40)

## 2021-03-15 LAB — COMPREHENSIVE METABOLIC PANEL
ALT: 41 IU/L (ref 0–44)
AST: 18 IU/L (ref 0–40)
Albumin/Globulin Ratio: 1.3 (ref 1.2–2.2)
Albumin: 4.1 g/dL (ref 3.8–4.8)
Alkaline Phosphatase: 103 IU/L (ref 44–121)
BUN/Creatinine Ratio: 17 (ref 10–24)
BUN: 24 mg/dL (ref 8–27)
Bilirubin Total: 0.6 mg/dL (ref 0.0–1.2)
CO2: 23 mmol/L (ref 20–29)
Calcium: 9.4 mg/dL (ref 8.6–10.2)
Chloride: 101 mmol/L (ref 96–106)
Creatinine, Ser: 1.41 mg/dL — ABNORMAL HIGH (ref 0.76–1.27)
Globulin, Total: 3.2 g/dL (ref 1.5–4.5)
Glucose: 187 mg/dL — ABNORMAL HIGH (ref 70–99)
Potassium: 5 mmol/L (ref 3.5–5.2)
Sodium: 138 mmol/L (ref 134–144)
Total Protein: 7.3 g/dL (ref 6.0–8.5)
eGFR: 56 mL/min/{1.73_m2} — ABNORMAL LOW (ref >59)

## 2021-04-09 ENCOUNTER — Other Ambulatory Visit: Payer: Self-pay | Admitting: Nurse Practitioner

## 2021-04-09 DIAGNOSIS — M109 Gout, unspecified: Secondary | ICD-10-CM

## 2021-04-09 MED ORDER — COLCHICINE 0.6 MG PO TABS: 0.6000 mg | ORAL_TABLET | Freq: Every day | ORAL | 0 refills | Status: DC

## 2021-06-14 ENCOUNTER — Other Ambulatory Visit: Payer: Self-pay

## 2021-06-14 DIAGNOSIS — Z0000 Encounter for general adult medical examination without abnormal findings: Secondary | ICD-10-CM

## 2021-06-14 DIAGNOSIS — R7303 Prediabetes: Secondary | ICD-10-CM

## 2021-06-14 DIAGNOSIS — L03116 Cellulitis of left lower limb: Secondary | ICD-10-CM

## 2021-06-15 LAB — CBC WITH DIFFERENTIAL/PLATELET
Basophils Absolute: 0.1 10*3/uL (ref 0.0–0.2)
Basos: 1 %
EOS (ABSOLUTE): 0.2 10*3/uL (ref 0.0–0.4)
Eos: 2 %
Hematocrit: 45.1 % (ref 37.5–51.0)
Hemoglobin: 15.2 g/dL (ref 13.0–17.7)
Immature Grans (Abs): 0 10*3/uL (ref 0.0–0.1)
Immature Granulocytes: 0 %
Lymphocytes Absolute: 1.5 10*3/uL (ref 0.7–3.1)
Lymphs: 17 %
MCH: 30.5 pg (ref 26.6–33.0)
MCHC: 33.7 g/dL (ref 31.5–35.7)
MCV: 91 fL (ref 79–97)
Monocytes Absolute: 0.7 10*3/uL (ref 0.1–0.9)
Monocytes: 8 %
Neutrophils Absolute: 6.1 10*3/uL (ref 1.4–7.0)
Neutrophils: 72 %
Platelets: 297 10*3/uL (ref 150–450)
RBC: 4.98 x10E6/uL (ref 4.14–5.80)
RDW: 13.9 % (ref 11.6–15.4)
WBC: 8.5 10*3/uL (ref 3.4–10.8)

## 2021-06-15 LAB — COMPREHENSIVE METABOLIC PANEL
ALT: 13 IU/L (ref 0–44)
AST: 13 IU/L (ref 0–40)
Albumin/Globulin Ratio: 1.7 (ref 1.2–2.2)
Albumin: 4.7 g/dL (ref 3.8–4.8)
Alkaline Phosphatase: 94 IU/L (ref 44–121)
BUN/Creatinine Ratio: 13 (ref 10–24)
BUN: 17 mg/dL (ref 8–27)
Bilirubin Total: 1 mg/dL (ref 0.0–1.2)
CO2: 23 mmol/L (ref 20–29)
Calcium: 9.6 mg/dL (ref 8.6–10.2)
Chloride: 98 mmol/L (ref 96–106)
Creatinine, Ser: 1.3 mg/dL — ABNORMAL HIGH (ref 0.76–1.27)
Globulin, Total: 2.8 g/dL (ref 1.5–4.5)
Glucose: 115 mg/dL — ABNORMAL HIGH (ref 70–99)
Potassium: 5 mmol/L (ref 3.5–5.2)
Sodium: 138 mmol/L (ref 134–144)
Total Protein: 7.5 g/dL (ref 6.0–8.5)
eGFR: 61 mL/min/{1.73_m2} (ref >59)

## 2021-06-15 LAB — HEMOGLOBIN A1C
Est. average glucose Bld gHb Est-mCnc: 114 mg/dL
Hgb A1c MFr Bld: 5.6 % (ref 4.8–5.6)

## 2021-06-18 ENCOUNTER — Telehealth: Payer: Self-pay

## 2021-06-18 NOTE — Telephone Encounter (Signed)
-----   Message from Teena Dunk, NP sent at 06/17/2021  9:56 AM EST ----- Labs wnl, no changes to treatment plan needed at this time. Maintain upcoming follow up with PCP.

## 2021-06-18 NOTE — Telephone Encounter (Signed)
Pt was called and is aware of results, DOB was confirmed.  ?

## 2021-09-13 ENCOUNTER — Ambulatory Visit: Payer: Self-pay | Admitting: Nurse Practitioner

## 2021-09-18 ENCOUNTER — Ambulatory Visit (INDEPENDENT_AMBULATORY_CARE_PROVIDER_SITE_OTHER): Payer: Self-pay | Admitting: Nurse Practitioner

## 2021-09-18 ENCOUNTER — Encounter: Payer: Self-pay | Admitting: Nurse Practitioner

## 2021-09-18 ENCOUNTER — Other Ambulatory Visit: Payer: Self-pay | Admitting: Nurse Practitioner

## 2021-09-18 VITALS — BP 139/90 | HR 85 | Temp 98.6°F | Ht 71.0 in | Wt 201.2 lb

## 2021-09-18 DIAGNOSIS — M109 Gout, unspecified: Secondary | ICD-10-CM

## 2021-09-18 DIAGNOSIS — R7303 Prediabetes: Secondary | ICD-10-CM

## 2021-09-18 DIAGNOSIS — I10 Essential (primary) hypertension: Secondary | ICD-10-CM

## 2021-09-18 LAB — POCT GLYCOSYLATED HEMOGLOBIN (HGB A1C)
HbA1c POC (<> result, manual entry): 5.5 % (ref 4.0–5.6)
HbA1c, POC (controlled diabetic range): 5.5 % (ref 0.0–7.0)
HbA1c, POC (prediabetic range): 5.5 % — AB (ref 5.7–6.4)
Hemoglobin A1C: 5.5 % (ref 4.0–5.6)

## 2021-09-18 MED ORDER — LISINOPRIL 20 MG PO TABS: 20.0000 mg | ORAL_TABLET | Freq: Every day | ORAL | 3 refills | Status: DC

## 2021-09-18 NOTE — Progress Notes (Signed)
? ?[place] ?[place], [place]  [address] ?Phone:  [phone]   Fax:  [phone] ?Subjective:  ? Patient ID: Marc Krueger, male    DOB: [birth year], 66 y.o.   MRN: [id number] ? ?Chief Complaint  ?Patient presents with  ? Follow-up  ?  Patient is here today for his 6 month follow up and medication refills of the colchicine  ? ?HPI ?Marc Krueger 66 y.o. male  has a past medical history of Allergy (11-23-1012), Arthritis, and Sinusitis. To the [facility] for wellness visit and follow up.  ? ?States that since previous visit, he has lost 40 lbs and has been compliant with all medications. Has only had to use colchicine twice since initiation of treatment. Monitors diet, and does intermittent fasting. Currently very active with current occupation at [employer].  ? ?Denies any other concerns today. Denies any fatigue, chest pain, shortness of breath, HA or dizziness. Denies any blurred vision, numbness or tingling. ? ?Past Medical History:  ?Diagnosis Date  ? Allergy 11-23-1012  ? seasonal alllergy  ? Arthritis   ? osteoarthritis-knees  ? Sinusitis   ? occ./occ. ringing in ears  ? ? ?Past Surgical History:  ?Procedure Laterality Date  ? foot sugery Bilateral 1980  ? pt states he had hammertoes and bunions  ? JOINT REPLACEMENT    ? LASIK    ? 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 Procedure: LEFT TOTAL KNEE ARTHROPLASTY;  Surgeon: Mauri Pole, MD;  Location: [facility];  Service: Orthopedics;  Laterality: Left;  ? ? ?Family History  ?Problem Relation Age of Onset  ? Arthritis Mother   ? Heart disease Mother   ? Diabetes Mother   ? Lung cancer Sister   ? Arthritis Brother   ? Diabetes Brother   ? Arthritis Brother   ? ? ?Social History  ? ?Socioeconomic History  ? Marital status: Married  ?  Spouse name: Not on file  ? Number of children: Not on file  ? Years of education: Not on file  ? Highest education level: Not on file  ?Occupational History  ? Not on file  ?Tobacco Use  ? Smoking  status: Never  ? Smokeless tobacco: Never  ?Vaping Use  ? Vaping Use: Never used  ?Substance and Sexual Activity  ? Alcohol use: Not Currently  ?  Comment: 1 per week  ? Drug use: No  ? Sexual activity: Yes  ?Other Topics Concern  ? Not on file  ?Social History Narrative  ? Not on file  ? ?Social Determinants of Health  ? ?Financial Resource Strain: Not on file  ?Food Insecurity: Not on file  ?Transportation Needs: Not on file  ?Physical Activity: Not on file  ?Stress: Not on file  ?Social Connections: Not on file  ?Intimate Partner Violence: Not on file  ? ? ?Outpatient Medications Prior to Visit  ?Medication Sig Dispense Refill  ? acetaminophen (TYLENOL) 500 MG tablet Take 500 mg by mouth every 6 (six) hours as needed for moderate pain. (Patient not taking: Reported on 09/18/2021)    ? Multiple Vitamins-Minerals (MULTIVITAMIN ADULTS PO) Take 2 capsules by mouth in the morning and at bedtime. (Patient not taking: Reported on 03/14/2021)    ? Omega-3 Fatty Acids (FISH OIL PO) Take 2 capsules by mouth in the morning and at bedtime. (Patient not taking: Reported on 03/14/2021)    ? oxyCODONE (OXY IR/ROXICODONE) 5 MG immediate release tablet Take 1 tablet (5 mg total) by  mouth every 6 (six) hours as needed for severe pain or moderate pain. (Patient not taking: Reported on 09/18/2021) 20 tablet 0  ? colchicine 0.6 MG tablet Take 1 tablet (0.6 mg total) by mouth daily. 30 tablet 0  ? ?No facility-administered medications prior to visit.  ? ? ?No Known Allergies ? ?Review of Systems  ?Constitutional: Negative.  Negative for chills, fever and malaise/fatigue.  ?Respiratory:  Negative for cough and shortness of breath.   ?Cardiovascular:  Negative for chest pain, palpitations and leg swelling.  ?Gastrointestinal:  Negative for abdominal pain, blood in stool, constipation, diarrhea, nausea and vomiting.  ?Musculoskeletal: Negative.   ?Skin: Negative.   ?Neurological: Negative.   ?Psychiatric/Behavioral:  Negative for  depression. The patient is not nervous/anxious.   ?All other systems reviewed and are negative. ? ?   ?Objective:  ?  ?Physical Exam ?Vitals reviewed.  ?Constitutional:   ?   General: He is not in acute distress. ?   Appearance: Normal appearance. He is normal weight.  ?HENT:  ?   Head: Normocephalic.  ?Neck:  ?   Vascular: No carotid bruit.  ?Cardiovascular:  ?   Rate and Rhythm: Normal rate and regular rhythm.  ?   Pulses: Normal pulses.  ?   Heart sounds: Normal heart sounds.  ?   Comments: No obvious peripheral edema ?Pulmonary:  ?   Effort: Pulmonary effort is normal.  ?   Breath sounds: Normal breath sounds.  ?Musculoskeletal:     ?   General: No swelling, tenderness, deformity or signs of injury. Normal range of motion.  ?   Cervical back: Normal range of motion and neck supple. No rigidity or tenderness.  ?   Right lower leg: No edema.  ?   Left lower leg: No edema.  ?Lymphadenopathy:  ?   Cervical: No cervical adenopathy.  ?Skin: ?   General: Skin is warm and dry.  ?   Capillary Refill: Capillary refill takes less than 2 seconds.  ?Neurological:  ?   General: No focal deficit present.  ?   Mental Status: He is alert and oriented to person, place, and time.  ?Psychiatric:     ?   Mood and Affect: Mood normal.     ?   Behavior: Behavior normal.     ?   Thought Content: Thought content normal.     ?   Judgment: Judgment normal.  ? ? ?BP 139/90   Pulse 85   Temp 98.6 ?F (37 ?C)   Ht $R'5\' 11"'dN$  (1.803 m)   Wt 201 lb 3.2 oz (91.3 kg)   SpO2 99%   BMI 28.06 kg/m?  ?Wt Readings from Last 3 Encounters:  ?09/18/21 201 lb 3.2 oz (91.3 kg)  ?03/14/21 215 lb 0.2 oz (97.5 kg)  ?03/07/21 235 lb 0.2 oz (106.6 kg)  ? ? ?Immunization History  ?Administered Date(s) Administered  ? Influenza Split 03/19/2012  ? Influenza,inj,Quad PF,6+ Mos 03/11/2013, 02/23/2014  ? Moderna Sars-Covid-2 Vaccination 11/09/2019, 12/08/2019  ? Tdap 03/19/2012  ? ? ?Diabetic Foot Exam - Simple   ?No data filed ?  ? ? ?No results found for:  TSH ?Lab Results  ?Component Value Date  ? WBC 9.1 09/18/2021  ? HGB 15.1 09/18/2021  ? HCT 44.4 09/18/2021  ? MCV 92 09/18/2021  ? PLT 273 09/18/2021  ? ?Lab Results  ?Component Value Date  ? NA 136 09/18/2021  ? K 5.1 09/18/2021  ? CO2 23 09/18/2021  ? GLUCOSE 132 (H)  09/18/2021  ? BUN 19 09/18/2021  ? CREATININE 1.16 09/18/2021  ? BILITOT 1.0 09/18/2021  ? ALKPHOS 112 09/18/2021  ? AST 16 09/18/2021  ? ALT 16 09/18/2021  ? PROT 7.6 09/18/2021  ? ALBUMIN 4.7 09/18/2021  ? CALCIUM 9.7 09/18/2021  ? ANIONGAP 10 03/09/2021  ? EGFR 70 09/18/2021  ? ?Lab Results  ?Component Value Date  ? CHOL 189 09/18/2021  ? CHOL 171 03/14/2021  ? CHOL 177 03/19/2012  ? ?Lab Results  ?Component Value Date  ? HDL 52 09/18/2021  ? HDL 36 (L) 03/14/2021  ? HDL 37.60 (L) 03/19/2012  ? ?Lab Results  ?Component Value Date  ? LDLCALC 124 (H) 09/18/2021  ? LDLCALC 109 (H) 03/14/2021  ? LDLCALC 112 (H) 03/19/2012  ? ?Lab Results  ?Component Value Date  ? TRIG 68 09/18/2021  ? TRIG 148 03/14/2021  ? TRIG 139.0 03/19/2012  ? ?Lab Results  ?Component Value Date  ? CHOLHDL 3.6 09/18/2021  ? CHOLHDL 4.8 03/14/2021  ? CHOLHDL 5 03/19/2012  ? ?Lab Results  ?Component Value Date  ? HGBA1C 5.5 09/18/2021  ? HGBA1C 5.5 09/18/2021  ? HGBA1C 5.5 (A) 09/18/2021  ? HGBA1C 5.5 09/18/2021  ? ? ?   ?Assessment & Plan:  ? ?Problem List Items Addressed This Visit   ?None ?Visit Diagnoses   ? ? Prediabetes    -  Primary  ? Relevant Orders  ? HgB A1c (Completed): 5.5, wnl, improved since previous visit  ? Acute gout, unspecified cause, unspecified site      ? Essential hypertension      ? Relevant Medications  ? lisinopril (ZESTRIL) 20 MG tablet, initiated during visit  ? Other Relevant Orders  ? Lipid panel (Completed)  ? CBC with Differential/Platelet (Completed)  ? Comprehensive metabolic panel (Completed) ?Encouraged continued diet and exercise efforts  ?Encouraged continued compliance with medication  ?  ? ?Follow up in 2 mths for reevaluation of HTN, sooner  as needed   ? ? ?I am having Marc Krueger start on lisinopril. I am also having him maintain his acetaminophen, Omega-3 Fatty Acids (FISH OIL PO), Multiple Vitamins-Minerals (MULTIVITAMIN ADULTS PO), and oxyCODONE. ? ?Meds or

## 2021-09-18 NOTE — Patient Instructions (Signed)
You were seen today in the [facility] for reevaluation of symptoms and wellness visit. Labs were collected, results will be available via MyChart or, if abnormal, you will be contacted by clinic staff. You were prescribed medications, please take as directed. Please follow up in 2 mths for reevaluation of HTN. ?

## 2021-09-19 LAB — LIPID PANEL
Chol/HDL Ratio: 3.6 ratio (ref 0.0–5.0)
Cholesterol, Total: 189 mg/dL (ref 100–199)
HDL: 52 mg/dL (ref >39)
LDL Chol Calc (NIH): 124 mg/dL — ABNORMAL HIGH (ref 0–99)
Triglycerides: 68 mg/dL (ref 0–149)
VLDL Cholesterol Cal: 13 mg/dL (ref 5–40)

## 2021-09-19 LAB — COMPREHENSIVE METABOLIC PANEL
ALT: 16 IU/L (ref 0–44)
AST: 16 IU/L (ref 0–40)
Albumin/Globulin Ratio: 1.6 (ref 1.2–2.2)
Albumin: 4.7 g/dL (ref 3.8–4.8)
Alkaline Phosphatase: 112 IU/L (ref 44–121)
BUN/Creatinine Ratio: 16 (ref 10–24)
BUN: 19 mg/dL (ref 8–27)
Bilirubin Total: 1 mg/dL (ref 0.0–1.2)
CO2: 23 mmol/L (ref 20–29)
Calcium: 9.7 mg/dL (ref 8.6–10.2)
Chloride: 99 mmol/L (ref 96–106)
Creatinine, Ser: 1.16 mg/dL (ref 0.76–1.27)
Globulin, Total: 2.9 g/dL (ref 1.5–4.5)
Glucose: 132 mg/dL — ABNORMAL HIGH (ref 70–99)
Potassium: 5.1 mmol/L (ref 3.5–5.2)
Sodium: 136 mmol/L (ref 134–144)
Total Protein: 7.6 g/dL (ref 6.0–8.5)
eGFR: 70 mL/min/{1.73_m2} (ref >59)

## 2021-09-19 LAB — CBC WITH DIFFERENTIAL/PLATELET
Basophils Absolute: 0.1 10*3/uL (ref 0.0–0.2)
Basos: 1 %
EOS (ABSOLUTE): 0.3 10*3/uL (ref 0.0–0.4)
Eos: 3 %
Hematocrit: 44.4 % (ref 37.5–51.0)
Hemoglobin: 15.1 g/dL (ref 13.0–17.7)
Immature Grans (Abs): 0 10*3/uL (ref 0.0–0.1)
Immature Granulocytes: 0 %
Lymphocytes Absolute: 1.6 10*3/uL (ref 0.7–3.1)
Lymphs: 17 %
MCH: 31.3 pg (ref 26.6–33.0)
MCHC: 34 g/dL (ref 31.5–35.7)
MCV: 92 fL (ref 79–97)
Monocytes Absolute: 0.7 10*3/uL (ref 0.1–0.9)
Monocytes: 8 %
Neutrophils Absolute: 6.4 10*3/uL (ref 1.4–7.0)
Neutrophils: 71 %
Platelets: 273 10*3/uL (ref 150–450)
RBC: 4.83 x10E6/uL (ref 4.14–5.80)
RDW: 13.6 % (ref 11.6–15.4)
WBC: 9.1 10*3/uL (ref 3.4–10.8)

## 2021-11-18 ENCOUNTER — Ambulatory Visit: Payer: Self-pay | Admitting: Nurse Practitioner

## 2021-11-22 ENCOUNTER — Ambulatory Visit (INDEPENDENT_AMBULATORY_CARE_PROVIDER_SITE_OTHER): Payer: Self-pay | Admitting: Nurse Practitioner

## 2021-11-22 ENCOUNTER — Encounter: Payer: Self-pay | Admitting: Nurse Practitioner

## 2021-11-22 VITALS — BP 134/74 | HR 72 | Temp 97.7°F | Ht 71.0 in | Wt 195.4 lb

## 2021-11-22 DIAGNOSIS — M109 Gout, unspecified: Secondary | ICD-10-CM

## 2021-11-22 DIAGNOSIS — I10 Essential (primary) hypertension: Secondary | ICD-10-CM

## 2021-11-22 DIAGNOSIS — B351 Tinea unguium: Secondary | ICD-10-CM

## 2021-11-22 MED ORDER — LOSARTAN POTASSIUM 25 MG PO TABS: 25.0000 mg | ORAL_TABLET | Freq: Every day | ORAL | 0 refills | Status: DC

## 2021-11-22 MED ORDER — COLCHICINE 0.6 MG PO TABS: 0.6000 mg | ORAL_TABLET | Freq: Every day | ORAL | 0 refills | Status: DC

## 2021-11-22 NOTE — Patient Instructions (Signed)
You were seen today in the [facility] for reevaluation of chronic illness.  You were prescribed medications, please take as directed. Please follow up in 6 mths for wellness visit

## 2021-11-22 NOTE — Progress Notes (Signed)
[facility] Patient [facility] [address] [place], [place]  [address] Phone:  [phone]   Fax:  [phone] Subjective:   Patient ID: Marc Krueger, male    DOB: [birth year], 66 y.o.   MRN: [id number]  Chief Complaint  Patient presents with   Follow-up    Pt is here for 2 month BP follow up. Pt stated the BP medication is causing him to have a dry cough. Pt is requesting medication for toe fungus on his big toe and refill on gout medication   HPI Marc Krueger 66 y.o. male  has a past medical history of Allergy (11-23-1012), Arthritis, and Sinusitis. To the [facility] for reevaluation of HTN.   Hypertension: Patient here for follow-up of elevated blood pressure. He is exercising and is adherent to low salt diet.  Blood pressure is well controlled at home. Cardiac symptoms none. Patient denies chest pain, claudication, dyspnea, and fatigue.  Cardiovascular risk factors: advanced age (older than 75 for men, 2 for women) and male gender. Use of agents associated with hypertension: none. History of target organ damage: none.  Requesting change in B/P medication due to dry cough. States that he stopped taking medications for 1-2 days, with resolution of cough.  Last dose of B/P medication yesterday. States that B/P at home is similar to today's values.   Concerned today about fungus to the bilateral great toes. States that he has had symptoms x 2 mths and suspects its related to pedicure at nail salon. Has been applying clove oil with no improvement in symptoms. Denies any other concerns today.   Denies any fatigue, chest pain, shortness of breath, HA or dizziness. Denies any blurred vision, numbness or tingling.  Past Medical History:  Diagnosis Date   Allergy 11-23-1012   seasonal alllergy   Arthritis    osteoarthritis-knees   Sinusitis    occ./occ. 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   Procedure: LEFT TOTAL KNEE ARTHROPLASTY;  Surgeon: Shelda Pal, MD;  Location: [facility];  Service: Orthopedics;  Laterality: Left;    Family History  Problem Relation Age of Onset   Arthritis Mother    Heart disease Mother    Diabetes Mother    Lung cancer Sister    Arthritis Brother    Diabetes Brother    Arthritis Brother     Social History   Socioeconomic History   Marital status: Married    Spouse name: Not on file   Number of children: Not on file   Years of education: Not on file   Highest education level: Not on file  Occupational History   Not on file  Tobacco Use   Smoking status: Never   Smokeless tobacco: Never  Vaping Use   Vaping Use: Never used  Substance and Sexual Activity   Alcohol use: Not Currently    Comment: 1 per week   Drug use: No   Sexual activity: Yes  Other Topics Concern   Not on file  Social History Narrative   Not on file   Social Determinants of Health   Financial Resource Strain: Not on file  Food Insecurity: Not on file  Transportation Needs: Not on file  Physical Activity: Not on file  Stress: Not on file  Social Connections: Not on file  Intimate Partner Violence: Not on file    Outpatient Medications Prior to Visit  Medication Sig Dispense Refill   colchicine 0.6 MG tablet Take 1 tablet by mouth once daily 30 tablet 0   lisinopril (ZESTRIL) 20 MG tablet Take 1 tablet (20 mg total) by mouth daily. 90 tablet 3   acetaminophen (TYLENOL) 500 MG tablet Take 500 mg by mouth every 6 (six) hours as needed for moderate pain. (Patient not taking: Reported on 09/18/2021)     Multiple Vitamins-Minerals (MULTIVITAMIN ADULTS PO) Take 2 capsules by mouth in the morning and at bedtime. (Patient not taking: Reported on 03/14/2021)     Omega-3 Fatty Acids (FISH OIL PO) Take 2 capsules by mouth in the morning and at bedtime. (Patient not taking: Reported on  03/14/2021)     oxyCODONE (OXY IR/ROXICODONE) 5 MG immediate release tablet Take 1 tablet (5 mg total) by mouth every 6 (six) hours as needed for severe pain or moderate pain. (Patient not taking: Reported on 09/18/2021) 20 tablet 0   No facility-administered medications prior to visit.    No Known Allergies  Review of Systems  Constitutional:  Negative for chills, fever and malaise/fatigue.  Eyes: Negative.   Respiratory:  Positive for cough. Negative for shortness of breath.   Cardiovascular:  Negative for chest pain, palpitations and leg swelling.  Gastrointestinal:  Negative for abdominal pain, blood in stool, constipation, diarrhea, nausea and vomiting.  Skin:  Negative for itching and rash.       See HPI  Neurological: Negative.   Psychiatric/Behavioral:  Negative for depression. The patient is not nervous/anxious.   All other systems reviewed and are negative.      Objective:    Physical Exam Vitals reviewed.  Constitutional:      General: He is not in acute distress.    Appearance: Normal appearance. He is normal weight.  HENT:     Head: Normocephalic.  Neck:     Vascular: No carotid bruit.  Cardiovascular:     Rate and Rhythm: Normal rate and regular rhythm.     Pulses: Normal pulses.     Heart sounds: Normal heart sounds.     Comments: No obvious peripheral edema Pulmonary:     Effort: Pulmonary effort is normal.     Breath sounds: Normal breath sounds.  Musculoskeletal:     Cervical back: Normal range of motion and neck supple. No rigidity or tenderness.  Lymphadenopathy:     Cervical: No cervical adenopathy.  Skin:    General: Skin is warm and dry.     Capillary Refill: Capillary refill takes less than 2 seconds.     Coloration: Skin is not jaundiced or pale.     Findings: No bruising, erythema, lesion or rash.     Comments: Examination of bilateral nails deferred   Neurological:     General: No focal deficit present.     Mental Status: He is alert and  oriented to person, place, and time.  Psychiatric:        Mood and Affect: Mood normal.        Behavior: Behavior normal.        Thought Content: Thought content normal.        Judgment: Judgment normal.     BP 134/74 (BP Location: Right Arm, Patient Position: Sitting, Cuff Size: Large)   Pulse 72   Temp 97.7 F (36.5 C)   Ht 5\' 11"  (1.803 m)  Wt 195 lb 6 oz (88.6 kg)   SpO2 99%   BMI 27.25 kg/m  Wt Readings from Last 3 Encounters:  11/22/21 195 lb 6 oz (88.6 kg)  09/18/21 201 lb 3.2 oz (91.3 kg)  03/14/21 215 lb 0.2 oz (97.5 kg)    Immunization History  Administered Date(s) Administered   Influenza Split 03/19/2012   Influenza,inj,Quad PF,6+ Mos 03/11/2013, 02/23/2014   Moderna Sars-Covid-2 Vaccination 11/09/2019, 12/08/2019   Tdap 03/19/2012    Diabetic Foot Exam - Simple   No data filed     No results found for: "TSH" Lab Results  Component Value Date   WBC 9.1 09/18/2021   HGB 15.1 09/18/2021   HCT 44.4 09/18/2021   MCV 92 09/18/2021   PLT 273 09/18/2021   Lab Results  Component Value Date   NA 136 09/18/2021   K 5.1 09/18/2021   CO2 23 09/18/2021   GLUCOSE 132 (H) 09/18/2021   BUN 19 09/18/2021   CREATININE 1.16 09/18/2021   BILITOT 1.0 09/18/2021   ALKPHOS 112 09/18/2021   AST 16 09/18/2021   ALT 16 09/18/2021   PROT 7.6 09/18/2021   ALBUMIN 4.7 09/18/2021   CALCIUM 9.7 09/18/2021   ANIONGAP 10 03/09/2021   EGFR 70 09/18/2021   Lab Results  Component Value Date   CHOL 189 09/18/2021   CHOL 171 03/14/2021   CHOL 177 03/19/2012   Lab Results  Component Value Date   HDL 52 09/18/2021   HDL 36 (L) 03/14/2021   HDL 37.60 (L) 03/19/2012   Lab Results  Component Value Date   LDLCALC 124 (H) 09/18/2021   LDLCALC 109 (H) 03/14/2021   LDLCALC 112 (H) 03/19/2012   Lab Results  Component Value Date   TRIG 68 09/18/2021   TRIG 148 03/14/2021   TRIG 139.0 03/19/2012   Lab Results  Component Value Date   CHOLHDL 3.6 09/18/2021    CHOLHDL 4.8 03/14/2021   CHOLHDL 5 03/19/2012   Lab Results  Component Value Date   HGBA1C 5.5 09/18/2021   HGBA1C 5.5 09/18/2021   HGBA1C 5.5 (A) 09/18/2021   HGBA1C 5.5 09/18/2021       Assessment & Plan:   Problem List Items Addressed This Visit   None Visit Diagnoses     Essential hypertension    -  Primary   Relevant Medications   losartan (COZAAR) 25 MG tablet, initiated during visit  Lisinopril discontinued  Encouraged continued diet and exercise efforts  Encouraged continued compliance with medication     Acute gout, unspecified cause, unspecified site       Relevant Medications   colchicine 0.6 MG tablet, refilled without change  Discussed non pharmacological methods for management of symptoms Informed to take OTC medications as needed    Nail fungus       Relevant Orders   Ambulatory referral to Podiatry Discussed non pharmacological methods for management of symptoms Informed to take OTC medications as needed Discussed possible causes Encouraged to follow up with specialist for further evaluation and management   Follow up in 6 mths for wellness visit, sooner as needed     I have discontinued Rishard Gudger's lisinopril. I have also changed his colchicine. Additionally, I am having him start on losartan. Lastly, I am having him maintain his acetaminophen, Omega-3 Fatty Acids (FISH OIL PO), Multiple Vitamins-Minerals (MULTIVITAMIN ADULTS PO), and oxyCODONE.  Meds ordered this encounter  Medications   colchicine 0.6 MG tablet    Sig: Take 1 tablet (0.6  mg total) by mouth daily.    Dispense:  30 tablet    Refill:  0   losartan (COZAAR) 25 MG tablet    Sig: Take 1 tablet (25 mg total) by mouth daily.    Dispense:  90 tablet    Refill:  0     Teena Dunk, NP

## 2022-01-30 ENCOUNTER — Ambulatory Visit: Payer: Self-pay | Admitting: Podiatry

## 2022-02-10 ENCOUNTER — Other Ambulatory Visit: Payer: Self-pay | Admitting: Nurse Practitioner

## 2022-02-10 DIAGNOSIS — I10 Essential (primary) hypertension: Secondary | ICD-10-CM

## 2022-05-02 ENCOUNTER — Ambulatory Visit: Payer: Self-pay | Admitting: Nurse Practitioner

## 2022-05-04 ENCOUNTER — Other Ambulatory Visit: Payer: Self-pay | Admitting: Nurse Practitioner

## 2022-05-04 DIAGNOSIS — M109 Gout, unspecified: Secondary | ICD-10-CM

## 2022-05-08 NOTE — Telephone Encounter (Signed)
Caller & Relationship to patient:  MRN #  [id number]   Call Back Number:   Date of Last Office Visit: 11/18/21  Date of Next Office Visit: changing PCP 05/2022   Medication(s) to be Refilled: colchine   Preferred Pharmacy: [facility]  ** Please notify patient to allow 48-72 hours to process** **Let patient know to contact pharmacy at the end of the day to make sure medication is ready. ** **If patient has not been seen in a year or longer, book an appointment **Advise to use MyChart for refill requests OR to contact their pharmacy

## 2022-05-11 ENCOUNTER — Other Ambulatory Visit: Payer: Self-pay | Admitting: Nurse Practitioner

## 2022-05-11 DIAGNOSIS — I10 Essential (primary) hypertension: Secondary | ICD-10-CM

## 2022-05-30 ENCOUNTER — Encounter: Payer: Self-pay | Admitting: Medical-Surgical

## 2022-05-30 ENCOUNTER — Telehealth: Payer: Self-pay

## 2022-05-30 ENCOUNTER — Ambulatory Visit (INDEPENDENT_AMBULATORY_CARE_PROVIDER_SITE_OTHER): Payer: Medicare Other | Admitting: Medical-Surgical

## 2022-05-30 VITALS — BP 152/89 | HR 83 | Resp 20 | Ht 71.0 in | Wt 195.0 lb

## 2022-05-30 DIAGNOSIS — M109 Gout, unspecified: Secondary | ICD-10-CM | POA: Diagnosis not present

## 2022-05-30 DIAGNOSIS — Z1211 Encounter for screening for malignant neoplasm of colon: Secondary | ICD-10-CM

## 2022-05-30 DIAGNOSIS — I10 Essential (primary) hypertension: Secondary | ICD-10-CM | POA: Diagnosis not present

## 2022-05-30 DIAGNOSIS — R0989 Other specified symptoms and signs involving the circulatory and respiratory systems: Secondary | ICD-10-CM | POA: Diagnosis not present

## 2022-05-30 LAB — POC COVID19 BINAXNOW: SARS Coronavirus 2 Ag: POSITIVE — AB

## 2022-05-30 LAB — POCT INFLUENZA A/B
Influenza A, POC: NEGATIVE
Influenza B, POC: NEGATIVE

## 2022-05-30 MED ORDER — COLCHICINE 0.6 MG PO TABS: 0.6000 mg | ORAL_TABLET | Freq: Every day | ORAL | 1 refills | Status: DC

## 2022-05-30 MED ORDER — NIRMATRELVIR/RITONAVIR (PAXLOVID)TABLET: 3.0000 | ORAL_TABLET | Freq: Two times a day (BID) | ORAL | 0 refills | Status: DC

## 2022-05-30 MED ORDER — FLUTICASONE PROPIONATE 50 MCG/ACT NA SUSP: 2.0000 | Freq: Every day | NASAL | 6 refills | Status: DC

## 2022-05-30 MED ORDER — LOSARTAN POTASSIUM 25 MG PO TABS: 25.0000 mg | ORAL_TABLET | Freq: Every day | ORAL | 1 refills | Status: DC

## 2022-05-30 MED ORDER — MOLNUPIRAVIR EUA 200MG CAPSULE: 4.0000 | ORAL_CAPSULE | Freq: Two times a day (BID) | ORAL | 0 refills | Status: AC

## 2022-05-30 NOTE — Telephone Encounter (Signed)
Patient called stating that his insurance won't cover the Paxlovid but the pharmacy told him that there may be an alternative.

## 2022-05-30 NOTE — Addendum Note (Signed)
Addended bySamuel Bouche on: 05/30/2022 08:18 PM   Modules accepted: Orders

## 2022-05-30 NOTE — Progress Notes (Signed)
New Patient Office Visit  Subjective:  Patient ID: Marc Krueger, male    DOB: Oct 04, 1955  Age: 67 y.o. MRN: [id number]  CC:  Chief Complaint  Patient presents with   Establish Care   HPI Marc Krueger presents to establish care. 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  He recently had a DOT physical in August and is up-to-date on most preventative care.  Is due for a colonoscopy and would like to have a referral today.  History of gout that was discovered last year.  He had a severe gout flare with sepsis and was hospitalized for 4-5 days.  He was prescribed colchicine to use as needed and reports only needing this on rare occasions.  Has made multiple dietary changes and has lost approximately 50 pounds since his hospitalization.  Notes that the health scare led him to a healthier lifestyle.  Hypertension: Previously treated for hypertension with losartan 25 mg daily.  He does not monitor his blood pressure very closely, at least once daily.  He has a log with him today that shows that his average systolics are in the 756E-332R with diastolics in the 51O-84Z.  He is not currently exercising intentionally but does stay very active when at home.  Limiting dietary sodium.  History of sinusitis but over the last month has had significant rhinorrhea, postnasal drip, sneezing, and sinus pressure.  Over the last 3 days, his symptoms have worsened and now he has been having a frontal headache with scratchy throat, mild chest congestion, and a cough that is mildly productive.  Also has ear pressure and popping.  Had subjective fevers over the last couple of days.  Has not tested for flu/COVID.  Has been using Mucinex and Flonase to help manage symptoms.  Past Medical History:  Diagnosis Date   Allergy 11-23-1012   seasonal alllergy   Arthritis    osteoarthritis-knees   GERD (gastroesophageal reflux disease) early age   no longer   Sinusitis    occ./occ. 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   Procedure: LEFT TOTAL KNEE ARTHROPLASTY;  Surgeon: Mauri Pole, MD;  Location: [facility];  Service: Orthopedics;  Laterality: Left;    Family History  Problem Relation Age of Onset   Arthritis Mother    Heart disease Mother    Diabetes Mother    Lung cancer Sister    Arthritis Brother    Diabetes Brother    Arthritis Brother    Cancer Sister     Social History   Socioeconomic History   Marital status: Married    Spouse name: Not on file   Number of children: Not on file   Years of education: Not on file   Highest education level: Not on file  Occupational History   Not on file  Tobacco Use   Smoking status: Never   Smokeless tobacco: Never  Vaping Use   Vaping Use: Never used  Substance and Sexual Activity   Alcohol use: Not Currently    Comment: 1 per week   Drug use: No   Sexual activity: Yes  Other Topics Concern   Not  on file  Social History Narrative   Not on file   Social Determinants of Health   Financial Resource Strain: Not on file  Food Insecurity: Not on file  Transportation Needs: Not on file  Physical Activity: Not on file  Stress: Not on file  Social Connections: Not on file  Intimate Partner Violence: Not on file    ROS Review of Systems  Constitutional:  Negative for chills, fatigue, fever and unexpected weight change.  HENT:  Positive for congestion, postnasal drip, rhinorrhea, sinus pressure, sore throat (scratchy throat) and voice change.   Eyes:  Negative for visual disturbance.  Respiratory:  Positive for cough. Negative for chest tightness, shortness of breath and wheezing.   Cardiovascular:  Negative for chest pain, palpitations and leg swelling.  Gastrointestinal:   Negative for abdominal pain, constipation, diarrhea, nausea and vomiting.  Genitourinary:  Negative for dysuria, frequency and urgency.  Skin:  Negative for rash.  Neurological:  Negative for dizziness, light-headedness and headaches.  Psychiatric/Behavioral:  Negative for decreased concentration, dysphoric mood, self-injury, sleep disturbance and suicidal ideas. The patient is not nervous/anxious.     Objective:   Today's Vitals: BP (!) 152/89 (BP Location: Left Arm, Cuff Size: Large)   Pulse 83   Resp 20   Ht '5\' 11"'$  (1.803 m)   Wt 195 lb (88.5 kg)   SpO2 98%   BMI 27.20 kg/m   Physical Exam Constitutional:      General: He is not in acute distress.    Appearance: Normal appearance. He is not ill-appearing.  HENT:     Head: Normocephalic and atraumatic.     Right Ear: Tympanic membrane normal.     Left Ear: Tympanic membrane normal.     Nose: Nose normal.     Mouth/Throat:     Mouth: Mucous membranes are moist.     Pharynx: No oropharyngeal exudate or posterior oropharyngeal erythema.  Eyes:     Extraocular Movements: Extraocular movements intact.     Conjunctiva/sclera: Conjunctivae normal.     Pupils: Pupils are equal, round, and reactive to light.  Neck:     Thyroid: No thyromegaly.     Vascular: No carotid bruit or JVD.     Trachea: Trachea normal.  Cardiovascular:     Rate and Rhythm: Normal rate and regular rhythm.     Pulses: Normal pulses.     Heart sounds: Normal heart sounds. No murmur heard.    No friction rub. No gallop.  Pulmonary:     Effort: Pulmonary effort is normal. No respiratory distress.     Breath sounds: Normal breath sounds. No wheezing.  Abdominal:     General: Bowel sounds are normal. There is no distension.     Palpations: Abdomen is soft.     Tenderness: There is no abdominal tenderness. There is no guarding.  Musculoskeletal:        General: Normal range of motion.     Cervical back: Normal range of motion and neck supple.  Skin:     General: Skin is warm and dry.  Neurological:     Mental Status: He is alert and oriented to person, place, and time.     Cranial Nerves: No cranial nerve deficit.  Psychiatric:        Mood and Affect: Mood normal.        Behavior: Behavior normal.        Thought Content: Thought content normal.        Judgment: Judgment  normal.    Assessment & Plan:   1. Acute gout, unspecified cause, unspecified site Continue dietary modification to prevent gout flares.  Okay to use colchicine as needed if flares occur.  Advised to monitor for overuse as this can negatively affect kidney function. - colchicine 0.6 MG tablet; Take 1 tablet (0.6 mg total) by mouth daily.  Dispense: 30 tablet; Refill: 1  2. Essential hypertension Blood pressure elevated on arrival, recheck 145/77.  Continue monitoring blood pressure at home with a goal of 130/80 or less.  If consistently elevated, restart losartan 25 mg daily.  Work on low-sodium heart healthy diet.  Incorporate regular intentional exercise at least 3 times weekly. - losartan (COZAAR) 25 MG tablet; Take 1 tablet (25 mg total) by mouth daily.  Dispense: 90 tablet; Refill: 1  3. Colon cancer screening Referring to GI for colonoscopy. - Ambulatory referral to Gastroenterology  4. Respiratory symptoms 1 month of symptoms likely related to allergic rhinitis but with worsening over the last few days and subjective fevers, testing for COVID and flu.  5. COVID 19 infection Start Paxlovid BID x 5 days. Continue Mucinex, Flonase, and conservative measures. Reviewed quarantine recommendations.    Outpatient Encounter Medications as of 05/30/2022  Medication Sig   fluticasone (FLONASE) 50 MCG/ACT nasal spray Place 2 sprays into both nostrils daily.   nirmatrelvir/ritonavir (PAXLOVID) 20 x 150 MG & 10 x '100MG'$  TABS Take 3 tablets by mouth 2 (two) times daily for 5 days. (Take nirmatrelvir 150 mg two tablets twice daily for 5 days and ritonavir 100 mg one tablet  twice daily for 5 days) Patient GFR is 70   [DISCONTINUED] colchicine 0.6 MG tablet Take 1 tablet by mouth once daily   [DISCONTINUED] losartan (COZAAR) 25 MG tablet Take 1 tablet by mouth once daily   colchicine 0.6 MG tablet Take 1 tablet (0.6 mg total) by mouth daily.   losartan (COZAAR) 25 MG tablet Take 1 tablet (25 mg total) by mouth daily.   [DISCONTINUED] acetaminophen (TYLENOL) 500 MG tablet Take 500 mg by mouth every 6 (six) hours as needed for moderate pain. (Patient not taking: Reported on 09/18/2021)   [DISCONTINUED] Multiple Vitamins-Minerals (MULTIVITAMIN ADULTS PO) Take 2 capsules by mouth in the morning and at bedtime. (Patient not taking: Reported on 03/14/2021)   [DISCONTINUED] Omega-3 Fatty Acids (FISH OIL PO) Take 2 capsules by mouth in the morning and at bedtime. (Patient not taking: Reported on 03/14/2021)   [DISCONTINUED] oxyCODONE (OXY IR/ROXICODONE) 5 MG immediate release tablet Take 1 tablet (5 mg total) by mouth every 6 (six) hours as needed for severe pain or moderate pain. (Patient not taking: Reported on 09/18/2021)   No facility-administered encounter medications on file as of 05/30/2022.    Follow-up: Return in about 3 months (around 08/29/2022) for HTN follow up.   Clearnce Sorrel, DNP, APRN, FNP-BC [facility]

## 2022-07-18 ENCOUNTER — Ambulatory Visit (INDEPENDENT_AMBULATORY_CARE_PROVIDER_SITE_OTHER): Payer: Medicare Other | Admitting: Podiatry

## 2022-07-18 ENCOUNTER — Encounter: Payer: Self-pay | Admitting: Podiatry

## 2022-07-18 DIAGNOSIS — D2372 Other benign neoplasm of skin of left lower limb, including hip: Secondary | ICD-10-CM | POA: Diagnosis not present

## 2022-07-18 NOTE — Progress Notes (Signed)
  Subjective:  Patient ID: Marc Krueger, male    DOB: 11-22-1955,   MRN: [id number]  Chief Complaint  Patient presents with   Callouses    Left foot callus trim     67 y.o. male presents for concern of left foot lesion that has been present for quite a while. Hurts when he applies pressure to the area. He does have orthotics and not sure if this is causing the problem. No treatments thus far.  . Denies any other pedal complaints. Denies n/v/f/c.   Past Medical History:  Diagnosis Date   Allergy 11-23-1012   seasonal alllergy   Arthritis    osteoarthritis-knees   GERD (gastroesophageal reflux disease) early age   no longer   Sinusitis    occ./occ. ringing in ears    Objective:  Physical Exam: Vascular: DP/PT pulses 2/4 bilateral. CFT <3 seconds. Normal hair growth on digits. No edema.  Skin. No lacerations or abrasions bilateral feet. Hypkeratotic cored lesion noted to the plantar fifth metatarsal head with disruption of skin lines noted.  Musculoskeletal: MMT 5/5 bilateral lower extremities in DF, PF, Inversion and Eversion. Deceased ROM in DF of ankle joint.  Neurological: Sensation intact to light touch.   Assessment:   1. Benign neoplasm of skin of left lower extremity      Plan:  Patient was evaluated and treated and all questions answered. -Discussed benign lesions of the foot with patient and treatment options.  -Hyperkeratotic tissue was debrided with chisel without incident.  -Applied salycylic acid treatment to area with dressing. Advised to remove bandaging tomorrow.  -Encouraged daily moisturizing -Discussed use of pumice stone -Advised good supportive shoes and inserts -Did discuss potentially getting new CMO patient will consider in the future -Patient to return to office as needed or sooner if condition worsens.   Lorenda Peck, DPM

## 2022-08-29 ENCOUNTER — Ambulatory Visit: Payer: Medicare Other | Admitting: Medical-Surgical

## 2022-09-05 LAB — HM COLONOSCOPY

## 2022-09-12 ENCOUNTER — Ambulatory Visit (INDEPENDENT_AMBULATORY_CARE_PROVIDER_SITE_OTHER): Payer: Medicare Other | Admitting: Medical-Surgical

## 2022-09-12 ENCOUNTER — Encounter: Payer: Self-pay | Admitting: Medical-Surgical

## 2022-09-12 VITALS — BP 121/66 | HR 65 | Resp 20 | Ht 71.0 in | Wt 204.8 lb

## 2022-09-12 DIAGNOSIS — I10 Essential (primary) hypertension: Secondary | ICD-10-CM

## 2022-09-12 DIAGNOSIS — G8929 Other chronic pain: Secondary | ICD-10-CM | POA: Diagnosis not present

## 2022-09-12 DIAGNOSIS — M542 Cervicalgia: Secondary | ICD-10-CM

## 2022-09-12 DIAGNOSIS — Z125 Encounter for screening for malignant neoplasm of prostate: Secondary | ICD-10-CM | POA: Diagnosis not present

## 2022-09-12 NOTE — Progress Notes (Signed)
        Established patient visit  History, exam, impression, and plan:  1. Essential hypertension Pleasant 67 year old male presenting with a history of hypertension.  Has been taking losartan 25 mg daily as prescribed over the last couple of months.  Has been monitoring his blood pressure very closely at home.  Prior to initiating medication, his readings were a bit higher than the goal of 130/80.  Since then, his readings have been very good.  No hypotension or concerning symptoms.  Updating labs today.  Blood pressure at goal in office today at 121/66.  Continue losartan 25 mg daily as prescribed. - CBC with Differential/Platelet - COMPLETE METABOLIC PANEL WITH GFR - Lipid panel  2. Chronic neck pain Has a chronic history of neck pain that was previously treated with chiropractic services.  Has not been in a number of years but would like to reinitiate this.  He is having some intermittent numbness and tingling in his right hand that involves the first 3 digits.  Offered cervical spine x-rays however he would like to defer today.  Referring to chiropractic to find someone in his insurance network. - Ambulatory referral to Chiropractic  3. Prostate cancer screening Discussed risk versus benefits when measuring the PSA.  He is worried about prostate cancer as he has read a lot about it recently.  After discussion of the benefits of checking his PSA and the intentions behind it, he would like to proceed.  PSA ordered today. - PSA, Total with Reflex to PSA, Free  Procedures performed this visit: None.  Return in about 6 months (around 03/14/2023) for HTN follow up.  __________________________________ Thayer Ohm, DNP, APRN, FNP-BC [facility]

## 2022-09-15 LAB — COMPLETE METABOLIC PANEL WITH GFR
AG Ratio: 2 (calc) (ref 1.0–2.5)
ALT: 15 U/L (ref 9–46)
AST: 15 U/L (ref 10–35)
Albumin: 4.7 g/dL (ref 3.6–5.1)
Alkaline phosphatase (APISO): 75 U/L (ref 35–144)
BUN: 19 mg/dL (ref 7–25)
CO2: 25 mmol/L (ref 20–32)
Calcium: 9.5 mg/dL (ref 8.6–10.3)
Chloride: 107 mmol/L (ref 98–110)
Creat: 1.24 mg/dL (ref 0.70–1.35)
Globulin: 2.4 g/dL (calc) (ref 1.9–3.7)
Glucose, Bld: 120 mg/dL — ABNORMAL HIGH (ref 65–99)
Potassium: 5.1 mmol/L (ref 3.5–5.3)
Sodium: 142 mmol/L (ref 135–146)
Total Bilirubin: 0.9 mg/dL (ref 0.2–1.2)
Total Protein: 7.1 g/dL (ref 6.1–8.1)
eGFR: 65 mL/min/{1.73_m2} (ref >=60)

## 2022-09-15 LAB — CBC WITH DIFFERENTIAL/PLATELET
Absolute Monocytes: 448 cells/uL (ref 200–950)
Basophils Absolute: 51 cells/uL (ref 0–200)
Basophils Relative: 0.8 %
Eosinophils Absolute: 160 cells/uL (ref 15–500)
Eosinophils Relative: 2.5 %
HCT: 41 % (ref 38.5–50.0)
Hemoglobin: 14.4 g/dL (ref 13.2–17.1)
Lymphs Abs: 1184 cells/uL (ref 850–3900)
MCH: 32.7 pg (ref 27.0–33.0)
MCHC: 35.1 g/dL (ref 32.0–36.0)
MCV: 93.2 fL (ref 80.0–100.0)
MPV: 8.9 fL (ref 7.5–12.5)
Monocytes Relative: 7 %
Neutro Abs: 4557 cells/uL (ref 1500–7800)
Neutrophils Relative %: 71.2 %
Platelets: 258 10*3/uL (ref 140–400)
RBC: 4.4 10*6/uL (ref 4.20–5.80)
RDW: 13.1 % (ref 11.0–15.0)
Total Lymphocyte: 18.5 %
WBC: 6.4 10*3/uL (ref 3.8–10.8)

## 2022-09-15 LAB — LIPID PANEL
Cholesterol: 173 mg/dL (ref <200)
HDL: 53 mg/dL (ref >=40)
LDL Cholesterol (Calc): 105 mg/dL (calc) — ABNORMAL HIGH
Non-HDL Cholesterol (Calc): 120 mg/dL (calc) (ref <130)
Total CHOL/HDL Ratio: 3.3 (calc) (ref <5.0)
Triglycerides: 63 mg/dL (ref <150)

## 2022-09-15 LAB — PSA, TOTAL WITH REFLEX TO PSA, FREE: PSA, Total: 0.9 ng/mL (ref <=4.0)

## 2022-10-17 ENCOUNTER — Encounter: Payer: Self-pay | Admitting: Medical-Surgical

## 2023-03-20 ENCOUNTER — Ambulatory Visit: Payer: Medicare Other | Admitting: Medical-Surgical

## 2023-06-05 ENCOUNTER — Ambulatory Visit: Payer: Medicare Other | Admitting: Medical-Surgical

## 2023-06-12 ENCOUNTER — Ambulatory Visit: Payer: Medicare Other | Attending: Medical-Surgical

## 2023-06-12 ENCOUNTER — Encounter: Payer: Self-pay | Admitting: Medical-Surgical

## 2023-06-12 ENCOUNTER — Ambulatory Visit (INDEPENDENT_AMBULATORY_CARE_PROVIDER_SITE_OTHER): Payer: Medicare Other | Admitting: Medical-Surgical

## 2023-06-12 VITALS — BP 139/89 | HR 83 | Ht 71.0 in | Wt 205.0 lb

## 2023-06-12 DIAGNOSIS — I499 Cardiac arrhythmia, unspecified: Secondary | ICD-10-CM

## 2023-06-12 DIAGNOSIS — I498 Other specified cardiac arrhythmias: Secondary | ICD-10-CM | POA: Diagnosis not present

## 2023-06-12 DIAGNOSIS — R21 Rash and other nonspecific skin eruption: Secondary | ICD-10-CM | POA: Diagnosis not present

## 2023-06-12 DIAGNOSIS — J40 Bronchitis, not specified as acute or chronic: Secondary | ICD-10-CM

## 2023-06-12 DIAGNOSIS — J329 Chronic sinusitis, unspecified: Secondary | ICD-10-CM

## 2023-06-12 MED ORDER — METHYLPREDNISOLONE 4 MG PO TBPK: ORAL_TABLET | ORAL | 0 refills | Status: DC

## 2023-06-12 MED ORDER — AZITHROMYCIN 250 MG PO TABS: ORAL_TABLET | ORAL | 0 refills | Status: AC

## 2023-06-12 NOTE — Progress Notes (Unsigned)
EP to read

## 2023-06-12 NOTE — Progress Notes (Unsigned)
        Established patient visit  History, exam, impression, and plan:  2 months ago, wedding, sinus congestion, sick contacts, no fever, runny nose, itchy eyes, dry eyes, cough (NP)  No HA, dizzy,   [place] over the last 2 weeks, left side blocked  Allergy meds, eye drops Refresh  ROS  Physical Exam  Procedures performed this visit: None.  No follow-ups on file.  __________________________________ Thayer Ohm, DNP, APRN, FNP-BC [facility]

## 2023-06-18 DIAGNOSIS — I499 Cardiac arrhythmia, unspecified: Secondary | ICD-10-CM | POA: Diagnosis not present

## 2023-06-18 DIAGNOSIS — I498 Other specified cardiac arrhythmias: Secondary | ICD-10-CM

## 2023-06-23 ENCOUNTER — Other Ambulatory Visit: Payer: Self-pay | Admitting: Medical-Surgical

## 2023-06-23 ENCOUNTER — Encounter: Payer: Self-pay | Admitting: Medical-Surgical

## 2023-06-23 DIAGNOSIS — M109 Gout, unspecified: Secondary | ICD-10-CM

## 2023-06-23 MED ORDER — COLCHICINE 0.6 MG PO TABS: 0.6000 mg | ORAL_TABLET | Freq: Every day | ORAL | 1 refills | Status: DC

## 2023-06-23 NOTE — Telephone Encounter (Signed)
Is it ok to refill this prescription?

## 2023-07-03 ENCOUNTER — Encounter: Payer: Self-pay | Admitting: Medical-Surgical

## 2023-07-03 DIAGNOSIS — I471 Supraventricular tachycardia, unspecified: Secondary | ICD-10-CM

## 2023-07-03 DIAGNOSIS — I472 Ventricular tachycardia, unspecified: Secondary | ICD-10-CM

## 2023-07-08 DIAGNOSIS — M1990 Unspecified osteoarthritis, unspecified site: Secondary | ICD-10-CM | POA: Insufficient documentation

## 2023-07-08 DIAGNOSIS — T7840XA Allergy, unspecified, initial encounter: Secondary | ICD-10-CM | POA: Insufficient documentation

## 2023-07-08 DIAGNOSIS — K219 Gastro-esophageal reflux disease without esophagitis: Secondary | ICD-10-CM | POA: Insufficient documentation

## 2023-07-08 DIAGNOSIS — J329 Chronic sinusitis, unspecified: Secondary | ICD-10-CM | POA: Insufficient documentation

## 2023-07-10 ENCOUNTER — Ambulatory Visit: Payer: Medicare Other

## 2023-07-10 VITALS — BP 126/82 | HR 73 | Ht 71.0 in | Wt 210.4 lb

## 2023-07-10 DIAGNOSIS — I4729 Other ventricular tachycardia: Secondary | ICD-10-CM | POA: Insufficient documentation

## 2023-07-10 DIAGNOSIS — M1990 Unspecified osteoarthritis, unspecified site: Secondary | ICD-10-CM

## 2023-07-10 DIAGNOSIS — I340 Nonrheumatic mitral (valve) insufficiency: Secondary | ICD-10-CM | POA: Insufficient documentation

## 2023-07-10 DIAGNOSIS — I499 Cardiac arrhythmia, unspecified: Secondary | ICD-10-CM | POA: Insufficient documentation

## 2023-07-10 MED ORDER — METOPROLOL SUCCINATE ER 25 MG PO TB24: 12.5000 mg | ORAL_TABLET | Freq: Every day | ORAL | 2 refills | Status: DC

## 2023-07-10 NOTE — Assessment & Plan Note (Addendum)
Short runs of NSVT on event monitor 7-day study from January 2025.  Asymptomatic.  Discussed potential triggers for ventricular and supraventricular ectopic beats and short runs. Physical and emotional stress, thyroid hormone abnormalities, sleep apnea, cardiac structural and functional issues and ischemia discussed.  Will obtain thyroid panel TSH, free T3-T4. Will check transthoracic echocardiogram to rule out cardiac structural and functional issues. Will obtain a treadmill stress echocardiogram to rule out any significant ischemic concerns.  No symptoms at this time. Discussed empiric therapy with low-dose beta-blocker such as metoprolol succinate, discussed potential side effects of tiredness, fatigue, ED, reduced libido, depression, slow heart rates of low blood pressure typically most of the side effects tend to be with higher doses. He is agreeable to proceed. Start metoprolol succinate 12.5 mg once daily.  Advised further evaluation with his PCP to get evaluated for sleep apnea and start treatment if necessary.

## 2023-07-10 NOTE — Assessment & Plan Note (Addendum)
No significant heart murmur on physical examination. Normal biventricular function with mild mitral regurgitation reported on prior echocardiogram from March 2009 done in [place].  Images not available.  Was also associated with mild concentric left ventricular hypertrophy and grade 1 diastolic dysfunction.  Will obtain repeat transthoracic echocardiogram for baseline assessment, also to assess cardiac structure and function in the setting of ventricular and supraventricular ectopic beats as reviewed below.

## 2023-07-10 NOTE — Progress Notes (Signed)
Cardiology Consultation:    Date:  07/10/2023   ID:  Marc Krueger, DOB 01-25-1956, MRN [id number]  PCP:  Marc Butter, NP  Cardiologist:  Marc Murphy, MD   Referring MD: Marc Butter, NP   No chief complaint on file.    ASSESSMENT AND PLAN:   Mr. Marc Krueger 68 year old male patient with history of hypertension, gout, GERD, chronic neck pain, mild mitral regurgitation on prior echocardiogram from March 2009 in [place] recently with irregular heart rhythm noted at PCPs office underwent evaluation with 7-day heart monitor January 2025 noted occasional ventricular ectopy burden 3.3% and supraventricular ectopy burden 2.7% with short runs of NSVT lasting up to 5 beats and short runs of SVT lasting up to 12 beats and triggered events during the monitoring correlated with irregular heart rhythm with isolated ventricular and supraventricular ectopic beats.  Problem List Items Addressed This Visit     NSVT (nonsustained ventricular tachycardia) (HCC) - Primary   Short runs of NSVT on event monitor 7-day study from January 2025.  Asymptomatic.  Discussed potential triggers for ventricular and supraventricular ectopic beats and short runs. Physical and emotional stress, thyroid hormone abnormalities, sleep apnea, cardiac structural and functional issues and ischemia discussed.  Will obtain thyroid panel TSH, free T3-T4. Will check transthoracic echocardiogram to rule out cardiac structural and functional issues. Will obtain a treadmill stress echocardiogram to rule out any significant ischemic concerns.  No symptoms at this time. Discussed empiric therapy with low-dose beta-blocker such as metoprolol succinate, discussed potential side effects of tiredness, fatigue, ED, reduced libido, depression, slow heart rates of low blood pressure typically most of the side effects tend to be with higher doses. He is agreeable to proceed. Start metoprolol succinate 12.5 mg once daily.  Advised  further evaluation with his PCP to get evaluated for sleep apnea and start treatment if necessary.      Relevant Medications   losartan (COZAAR) 25 MG tablet   metoprolol succinate (TOPROL XL) 25 MG 24 hr tablet   Other Relevant Orders   EKG 12-Lead (Completed)   ECHOCARDIOGRAM STRESS TEST   TSH   T4, free   T3, free   Mitral valve insufficiency   No significant heart murmur on physical examination. Normal biventricular function with mild mitral regurgitation reported on prior echocardiogram from March 2009 done in [place].  Images not available.  Was also associated with mild concentric left ventricular hypertrophy and grade 1 diastolic dysfunction.  Will obtain repeat transthoracic echocardiogram for baseline assessment, also to assess cardiac structure and function in the setting of ventricular and supraventricular ectopic beats as reviewed below.      Relevant Medications   losartan (COZAAR) 25 MG tablet   metoprolol succinate (TOPROL XL) 25 MG 24 hr tablet   Other Relevant Orders   ECHOCARDIOGRAM COMPLETE      History of Present Illness:    Marc Krueger is a 68 y.o. male who is being seen today for the evaluation of irregular heartbeats and abnormal 7-day event monitor study results at the request of Marc Butter, NP.  Has history of hypertension, chronic neck pain, gout, GERD, mild mitral regurgitation on prior echocardiogram report from March 2009 in [place]. No other significant prior cardiac history.  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  PCP on routine physical exam noted irregular heartbeats obtain 7-day event monitor that noted occasional ventricular and supraventricular  ectopy burden and runs of NSVT and SVT as reviewed below.  For the most part he remains asymptomatic from this.  A triggered event during 1 such episode where he was feeling anxious in the  stomach correlated with ventricular and supraventricular ectopic beats.  Denies any chest pain, shortness of breath, orthopnea, paroxysmal nocturnal dyspnea  Mentions he does snore at night.  No prior evaluation for sleep apnea.  Has had remote flareup of gout that was severe and since then uses colchicine when he feels there is an onset of gout like symptoms.  Good compliance with his medications and blood pressure well-controlled on losartan 25 mg once daily.  He intentionally lost about 40 to 50 pounds in the past couple years and has seen significant improvement in his gastroesophageal reflux.  EKG in the clinic today shows sinus rhythm with heart rate 73/min, isolated premature supraventricular complex observed.  No significant ST-T changes to suggest ischemia.  Normal PR interval 174 ms, normal QRS duration 78 ms, normal QTc.  Recently had event monitor 7-day study from 06-18-2023, predominantly sinus rhythm with average heart rate 69/minute [46 bpm to 124 bpm].  Occasional ventricular ectopy burden 3.3% and occasional supraventricular ectopy burden 2.7%.  4 short runs of nonsustained ventricular tachycardia observed with the longest episode lasting 5 beats.  Short runs of supraventricular tachycardia also observed with the longest episode lasting 12 beats.  Patient triggered events were observed 2 times correlated with sinus rhythm with ventricular and supraventricular ectopic beats  Remote echocardiogram report from March 2009 from [place] reported LVEF 70% with mild concentric left ventricular hypertrophy, grade 1 diastolic dysfunction, mild mitral regurgitation.  Does not smoke. Occasional social alcohol consumption. No illicit drugs.  Blood work from 09-12-2022 Lipid panel total cholesterol 173, HDL 53, LDL 105, triglycerides 63. Complete metabolic panel with BUN 19, creatinine 1.24, EGFR 65. Sodium 142, potassium 5.1.  Normal transaminases and alkaline phosphatase CBC normal with  hemoglobin 14.4 and hematocrit 41, WBC 6.4, platelets 258.  Past Medical History:  Diagnosis Date   Allergy 11-23-1012   seasonal alllergy   Arthritis    osteoarthritis-knees   Cellulitis of foot 03/07/2021   Cellulitis of left foot 03/07/2021   Chronic cough 05/10/2013   Effusion of right knee 03/08/2021   GERD (gastroesophageal reflux disease) early age   no longer   Hyponatremia 03/08/2021   Osteoarthritis of both knees 03/19/2012   Post-nasal drip 03/19/2012   Pulmonary nodules 08/16/2013   S/P left TKA 12/07/2012   Sinusitis    occ./occ. ringing in ears    Past Surgical History:  Procedure Laterality Date   EYE SURGERY  2003   lasik   foot sugery Bilateral 1980   pt states he had hammertoes and bunions   HERNIA REPAIR  birth   JOINT REPLACEMENT     LASIK     TOTAL KNEE ARTHROPLASTY Left 12/07/2012   Procedure: LEFT TOTAL KNEE ARTHROPLASTY;  Surgeon: Shelda Pal, MD;  Location: [facility];  Service: Orthopedics;  Laterality: Left;    Current Medications: Current Meds  Medication Sig   colchicine 0.6 MG tablet Take 1 tablet (0.6 mg total) by mouth daily.   diclofenac (VOLTAREN) 75 MG EC tablet Take 75 mg by mouth as needed for mild pain (pain score 1-3) or moderate pain (pain score 4-6).   fluticasone (FLONASE) 50 MCG/ACT nasal spray Place 2 sprays into both nostrils daily.   losartan (COZAAR) 25 MG tablet Take 25 mg by mouth daily.  metoprolol succinate (TOPROL XL) 25 MG 24 hr tablet Take 0.5 tablets (12.5 mg total) by mouth daily.   triamcinolone cream (KENALOG) 0.1 % Apply 1 Application topically as needed (rash).   [DISCONTINUED] methylPREDNISolone (MEDROL DOSEPAK) 4 MG TBPK tablet Take as directed.     Allergies:   Patient has no known allergies.   Social History   Socioeconomic History   Marital status: Married    Spouse name: Not on file   Number of children: Not on file   Years of education: Not on file   Highest education level: 12th grade   Occupational History   Not on file  Tobacco Use   Smoking status: Never   Smokeless tobacco: Never  Vaping Use   Vaping status: Never Used  Substance and Sexual Activity   Alcohol use: Not Currently    Comment: 1 per week   Drug use: No   Sexual activity: Yes  Other Topics Concern   Not on file  Social History Narrative   Not on file   Social Drivers of Health   Financial Resource Strain: Low Risk  (06/10/2023)   Overall Financial Resource Strain (CARDIA)    Difficulty of Paying Living Expenses: Not hard at all  Food Insecurity: No Food Insecurity (06/10/2023)   Hunger Vital Sign    Worried About Running Out of Food in the Last Year: Never true    Ran Out of Food in the Last Year: Never true  Transportation Needs: No Transportation Needs (06/10/2023)   PRAPARE - Administrator, Civil Service (Medical): No    Lack of Transportation (Non-Medical): No  Physical Activity: Insufficiently Active (06/10/2023)   Exercise Vital Sign    Days of Exercise per Week: 2 days    Minutes of Exercise per Session: 30 min  Stress: No Stress Concern Present (06/10/2023)   [employer] of Occupational Health - Occupational Stress Questionnaire    Feeling of Stress : Not at all  Social Connections: Moderately Integrated (06/10/2023)   Social Connection and Isolation Panel [NHANES]    Frequency of Communication with Friends and Family: More than three times a week    Frequency of Social Gatherings with Friends and Family: Once a week    Attends Religious Services: More than 4 times per year    Active Member of [employer] or Organizations: No    Attends Engineer, structural: Not on file    Marital Status: Married     Family History: The patient's family history includes Arthritis in his brother, brother, and mother; Cancer in his sister; Diabetes in his brother and mother; Heart disease in his mother; Lung cancer in his sister. ROS:   Please see the history of present  illness.    All 14 point review of systems negative except as described per history of present illness.  EKGs/Labs/Other Studies Reviewed:    The following studies were reviewed today:   EKG:  EKG Interpretation Date/Time:  Friday July 10 2023 15:37:37 EST Ventricular Rate:  73 PR Interval:  174 QRS Duration:  78 QT Interval:  402 QTC Calculation: 442 R Axis:   0  Text Interpretation: Sinus rhythm with Premature supraventricular complexes Otherwise normal ECG When compared with ECG of 07-Mar-2021 15:39, PREVIOUS ECG IS PRESENT Confirmed by Huntley Dec reddy [phone]) on 07/10/2023 3:45:53 PM    Recent Labs: 09/12/2022: ALT 15; BUN 19; Creat 1.24; Hemoglobin 14.4; Platelets 258; Potassium 5.1; Sodium 142  Recent Lipid Panel  Component Value Date/Time   CHOL 173 09/12/2022 0948   CHOL 189 09/18/2021 1033   TRIG 63 09/12/2022 0948   HDL 53 09/12/2022 0948   HDL 52 09/18/2021 1033   CHOLHDL 3.3 09/12/2022 0948   VLDL 27.8 03/19/2012 1342   LDLCALC 105 (H) 09/12/2022 0948    Physical Exam:    VS:  BP 126/82 (BP Location: Right Arm, Patient Position: Sitting)   Pulse 73   Ht 5\' 11"  (1.803 m)   Wt 210 lb 6.4 oz (95.4 kg)   SpO2 97%   BMI 29.34 kg/m     Wt Readings from Last 3 Encounters:  07/10/23 210 lb 6.4 oz (95.4 kg)  06/12/23 205 lb (93 kg)  09/12/22 204 lb 12.8 oz (92.9 kg)     GENERAL:  Well nourished, well developed in no acute distress NECK: No JVD; No carotid bruits CARDIAC: RRR, S1 and S2 present, no murmurs, no rubs, no gallops CHEST:  Clear to auscultation without rales, wheezing or rhonchi  Extremities: No pitting pedal edema. Pulses bilaterally symmetric with radial 2+ and dorsalis pedis 2+ NEUROLOGIC:  Alert and oriented x 3  Medication Adjustments/Labs and Tests Ordered: Current medicines are reviewed at length with the patient today.  Concerns regarding medicines are outlined above.  Orders Placed This Encounter  Procedures   TSH    T4, free   T3, free   EKG 12-Lead   ECHOCARDIOGRAM STRESS TEST   ECHOCARDIOGRAM COMPLETE   Meds ordered this encounter  Medications   metoprolol succinate (TOPROL XL) 25 MG 24 hr tablet    Sig: Take 0.5 tablets (12.5 mg total) by mouth daily.    Dispense:  45 tablet    Refill:  2    Signed, Jaquelinne Glendening reddy Eulises Kijowski, MD, MPH, [facility]. 07/10/2023 4:13 PM    [facility]

## 2023-07-10 NOTE — Patient Instructions (Addendum)
Medication Instructions:    Start Metoprolol 12.5 mg once a day  *If you need a refill on your cardiac medications before your next appointment, please call your pharmacy*   Lab Work: None ordered If you have labs (blood work) drawn today and your tests are completely normal, you will receive your results only by: MyChart Message (if you have MyChart) OR A paper copy in the mail If you have any lab test that is abnormal or we need to change your treatment, we will call you to review the results.   Testing/Procedures:  Stress Echocardiogram Instructions:    1. You may take all of your medications except Metoprolol (hold beta blockers).  2. No food, drink or tobacco products four hours prior to your test.  3. Dress prepared to exercise. Best to wear 2 piece outfit and tennis shoes. Shoes must be closed toe.  4. Please bring all current prescription medications.    Follow-Up: At [facility], you and your health needs are our priority.  As part of our continuing mission to provide you with exceptional heart care, we have created designated Provider Care Teams.  These Care Teams include your primary Cardiologist (physician) and Advanced Practice Providers (APPs -  Physician Assistants and Nurse Practitioners) who all work together to provide you with the care you need, when you need it.  We recommend signing up for the patient portal called "MyChart".  Sign up information is provided on this After Visit Summary.  MyChart is used to connect with patients for Virtual Visits (Telemedicine).  Patients are able to view lab/test results, encounter notes, upcoming appointments, etc.  Non-urgent messages can be sent to your provider as well.   To learn more about what you can do with MyChart, go to [email or web address].    Your next appointment:   Follow up based on test results    Other Instructions Exercise Stress Echocardiogram An exercise stress echocardiogram is a test to  check how well your heart is working. This test uses sound waves and a computer to make pictures of your heart. These pictures will be taken before and after you exercise. For this test, you will walk on a treadmill or ride a bicycle to make your heart beat faster. While you exercise, your heart will be checked with an electrocardiogram (ECG). Your blood pressure will also be checked. You may have this test if: You have chest pain or a heart problem. You had a heart attack or heart surgery not long ago. You have heart valve problems. You have a condition that causes narrowing of the blood vessels that supply your heart. You have a high risk of heart disease and: You are starting a new exercise program. You need to have a big surgery. Tell a doctor about: Any allergies you have. All medicines you are taking. This includes vitamins, herbs, eye drops, creams, and over-the-counter medicines. Any problems you or family members have had with medicines that make you fall asleep (anesthetic medicines). Any surgeries you have had. Any blood disorders you have. Any medical conditions you have. Whether you are pregnant or may be pregnant. What are the risks? Generally, this is a safe test. However, problems may occur, including: Chest pain. Feeling dizzy or light-headed. Shortness of breath. Increased or irregular heartbeat. Feeling like you may vomit (nausea) or vomiting. Heart attack. This is very rare. What happens before the test? Medicines Ask your doctor about changing or stopping your normal medicines. This is important if you take  diabetes medicines or blood thinners. If you use an inhaler, bring it to the test. General instructions Wear comfortable clothes and walking shoes. Follow instructions from your doctor about what you cannot eat or drink before the test. Do not drink or eat anything that has caffeine in it. Stop having caffeine 24 hours before the test. Do not smoke or use  products that contain nicotine or tobacco for 4 hours before the test. If you need help quitting, ask your doctor. What happens during the test?  You will take off your clothes from the waist up and put on a hospital gown. Electrodes or patches will be put on your chest. A blood pressure cuff will be put on your arm. Before you exercise, a computer will make a picture of your heart. To do this: You will lie down and a gel will be put on your chest. A wand will be moved over the gel. Sound waves from the wand will go to the computer to make the picture. Then, you will start to exercise. You may walk on a treadmill or pedal a bicycle. Your blood pressure and heart rhythm will be checked while you exercise. The exercise will get harder or faster. You will exercise until: Your heart reaches a certain level. You are too tired to go on. You cannot go on because of chest pain, weakness, or dizziness. You will lie down right away so another picture of your heart can be taken. The procedure may vary among doctors and hospitals. What can I expect after the test? After your test, it is common to have: Mild soreness. Mild tiredness. Your heart rate and blood pressure will be checked until they return to your normal levels. You should not have any new symptoms after this test. Follow these instructions at home: If your doctor says that you can, you may: Eat what you normally eat. Do your normal activities. Take over-the-counter and prescription medicines only as told by your doctor. Keep all follow-up visits. It is up to you to get the results of your test. Ask how to get your results when they are ready. Contact a doctor if: You feel dizzy or light-headed. You have a fast or irregular heartbeat. You feel like you may vomit or you vomit. You have a headache. You feel short of breath. Get help right away if: You develop pain or pressure: In your chest. In your jaw or neck. Between your  shoulders. That goes down your left arm. You faint. You have trouble breathing. These symptoms may be an emergency. Get medical help right away. Call your local emergency services (911 in the [place] Do not wait to see if the symptoms will go away. Do not drive yourself to the hospital. Summary This is a test that checks how well your heart is working. Follow instructions about what you cannot eat or drink before the test. Ask your doctor if you should take your normal medicines before the test. Stop having caffeine 24 hours before the test. Do not smoke or use products with nicotine or tobacco in them for 4 hours before the test. During the test, your blood pressure and heart rhythm will be checked while you exercise. This information is not intended to replace advice given to you by your health care provider. Make sure you discuss any questions you have with your health care provider. Document Revised: 01/23/2021 Document Reviewed: 01/03/2020 Elsevier Patient Education  2022 [employer].

## 2023-07-11 LAB — T4, FREE: Free T4: 1.34 ng/dL (ref 0.82–1.77)

## 2023-07-11 LAB — T3, FREE: T3, Free: 3.6 pg/mL (ref 2.0–4.4)

## 2023-07-11 LAB — TSH: TSH: 2.56 u[IU]/mL (ref 0.450–4.500)

## 2023-07-24 ENCOUNTER — Telehealth (HOSPITAL_COMMUNITY): Payer: Self-pay | Admitting: *Deleted

## 2023-07-24 NOTE — Telephone Encounter (Signed)
 Patient given detailed instructions per Stress Test Requisition Sheet for test on 07/31/2023 at 2:30.Patient Notified to arrive 30 minutes early, and that it is imperative to arrive on time for appointment to keep from having the test rescheduled.  Patient verbalized understanding. Daneil Dolin

## 2023-07-28 ENCOUNTER — Ambulatory Visit (HOSPITAL_BASED_OUTPATIENT_CLINIC_OR_DEPARTMENT_OTHER)
Admission: RE | Admit: 2023-07-28 | Discharge: 2023-07-28 | Disposition: A | Discharge status: Home / Self Care | Payer: Medicare Other | Source: Ambulatory Visit

## 2023-07-28 DIAGNOSIS — I340 Nonrheumatic mitral (valve) insufficiency: Secondary | ICD-10-CM | POA: Insufficient documentation

## 2023-07-28 LAB — ECHOCARDIOGRAM COMPLETE
AR max vel: 2.38 cm2
AV Area VTI: 2.37 cm2
AV Area mean vel: 2.2 cm2
AV Mean grad: 4 mmHg
AV Peak grad: 6.2 mmHg
Ao pk vel: 1.24 m/s
Area-P 1/2: 1.75 cm2
Calc EF: 67.2 %
MV M vel: 3.8 m/s
MV Peak grad: 57.8 mmHg
S' Lateral: 3 cm
Single Plane A2C EF: 70.1 %
Single Plane A4C EF: 61.5 %

## 2023-07-31 ENCOUNTER — Ambulatory Visit (HOSPITAL_COMMUNITY): Payer: Medicare Other

## 2023-07-31 ENCOUNTER — Ambulatory Visit (HOSPITAL_COMMUNITY): Payer: Medicare Other | Attending: Cardiology

## 2023-07-31 DIAGNOSIS — I4729 Other ventricular tachycardia: Secondary | ICD-10-CM | POA: Insufficient documentation

## 2023-07-31 DIAGNOSIS — I340 Nonrheumatic mitral (valve) insufficiency: Secondary | ICD-10-CM

## 2023-07-31 MED ORDER — PERFLUTREN LIPID MICROSPHERE
3.0000 mL | INTRAVENOUS | Status: AC | PRN
  Administered 2023-07-31: 3 mL via INTRAVENOUS

## 2023-08-14 ENCOUNTER — Ambulatory Visit (HOSPITAL_COMMUNITY)

## 2023-09-04 ENCOUNTER — Ambulatory Visit (INDEPENDENT_AMBULATORY_CARE_PROVIDER_SITE_OTHER): Payer: Medicare Other | Admitting: Medical-Surgical

## 2023-09-04 ENCOUNTER — Encounter: Payer: Self-pay | Admitting: Medical-Surgical

## 2023-09-04 VITALS — BP 129/80 | HR 62 | Resp 20 | Ht 71.0 in | Wt 207.1 lb

## 2023-09-04 DIAGNOSIS — I10 Essential (primary) hypertension: Secondary | ICD-10-CM

## 2023-09-04 DIAGNOSIS — Z125 Encounter for screening for malignant neoplasm of prostate: Secondary | ICD-10-CM

## 2023-09-04 DIAGNOSIS — M109 Gout, unspecified: Secondary | ICD-10-CM

## 2023-09-04 MED ORDER — FLUTICASONE PROPIONATE 50 MCG/ACT NA SUSP: 2.0000 | Freq: Every day | NASAL | 6 refills | Status: AC

## 2023-09-04 MED ORDER — LOSARTAN POTASSIUM 25 MG PO TABS: ORAL_TABLET | ORAL | 3 refills | Status: DC

## 2023-09-04 MED ORDER — COLCHICINE 0.6 MG PO TABS: 0.6000 mg | ORAL_TABLET | Freq: Every day | ORAL | 1 refills | Status: DC

## 2023-09-04 MED ORDER — DICLOFENAC SODIUM 75 MG PO TBEC: 75.0000 mg | DELAYED_RELEASE_TABLET | Freq: Every day | ORAL | 0 refills | Status: DC | PRN

## 2023-09-04 NOTE — Progress Notes (Signed)
        Established patient visit  History, exam, impression, and plan:  1. Acute gout, unspecified cause, unspecified site Very pleasant 68 year old male presenting today with a history of acute gout.  He notes that there has not been any flares of gout recently and he has been avoiding foods/drinks that are known to cause flares.  Has colchicine 0.6 mg tablets on hand to use for flares and notes that the medication works.  Tolerates it well without side effects.  Doing well overall.  Refilling colchicine today. - colchicine 0.6 MG tablet; Take 1 tablet (0.6 mg total) by mouth daily.  Dispense: 30 tablet; Refill: 1  2. Essential hypertension (Primary) History of essential hypertension currently managed with Toprol-XL 25 mg daily and losartan 25 mg daily.  Tolerating both medications well without side effects.  Not regularly checking blood pressure at home.  Following a low-sodium heart healthy diet.  Staying active as much as tolerated.  Blood pressure is slightly elevated at 130/80 on arrival however on recheck was 129/80.  Continue losartan and Toprol-XL as prescribed.  Checking labs below. - CBC - CMP14+EGFR - Lipid panel  3. Prostate cancer screening Checking PSA today. - PSA Total (Reflex To Free)   Procedures performed this visit: None.  Return in about 6 months (around 03/05/2024) for HTN/gout follow up.  __________________________________ Maryl Snook, DNP, APRN, FNP-BC Primary Care and Sports Medicine [facility] [place]

## 2023-09-05 ENCOUNTER — Encounter: Payer: Self-pay | Admitting: Medical-Surgical

## 2023-09-05 LAB — CBC
Hematocrit: 42 % (ref 37.5–51.0)
Hemoglobin: 14.6 g/dL (ref 13.0–17.7)
MCH: 32.4 pg (ref 26.6–33.0)
MCHC: 34.8 g/dL (ref 31.5–35.7)
MCV: 93 fL (ref 79–97)
Platelets: 302 10*3/uL (ref 150–450)
RBC: 4.51 x10E6/uL (ref 4.14–5.80)
RDW: 12.6 % (ref 11.6–15.4)
WBC: 7.1 10*3/uL (ref 3.4–10.8)

## 2023-09-05 LAB — LIPID PANEL
Chol/HDL Ratio: 3.4 ratio (ref 0.0–5.0)
Cholesterol, Total: 171 mg/dL (ref 100–199)
HDL: 51 mg/dL (ref >39)
LDL Chol Calc (NIH): 105 mg/dL — ABNORMAL HIGH (ref 0–99)
Triglycerides: 79 mg/dL (ref 0–149)
VLDL Cholesterol Cal: 15 mg/dL (ref 5–40)

## 2023-09-05 LAB — CMP14+EGFR
ALT: 15 IU/L (ref 0–44)
AST: 18 IU/L (ref 0–40)
Albumin: 4.5 g/dL (ref 3.9–4.9)
Alkaline Phosphatase: 85 IU/L (ref 44–121)
BUN/Creatinine Ratio: 14 (ref 10–24)
BUN: 16 mg/dL (ref 8–27)
Bilirubin Total: 1.3 mg/dL — ABNORMAL HIGH (ref 0.0–1.2)
CO2: 19 mmol/L — ABNORMAL LOW (ref 20–29)
Calcium: 9.7 mg/dL (ref 8.6–10.2)
Chloride: 102 mmol/L (ref 96–106)
Creatinine, Ser: 1.17 mg/dL (ref 0.76–1.27)
Globulin, Total: 2.7 g/dL (ref 1.5–4.5)
Glucose: 93 mg/dL (ref 70–99)
Potassium: 4.1 mmol/L (ref 3.5–5.2)
Sodium: 139 mmol/L (ref 134–144)
Total Protein: 7.2 g/dL (ref 6.0–8.5)
eGFR: 69 mL/min/{1.73_m2} (ref >59)

## 2023-09-05 LAB — PSA TOTAL (REFLEX TO FREE): Prostate Specific Ag, Serum: 0.9 ng/mL (ref 0.0–4.0)

## 2024-03-11 ENCOUNTER — Ambulatory Visit: Admitting: Medical-Surgical

## 2024-03-11 ENCOUNTER — Encounter: Payer: Self-pay | Admitting: Medical-Surgical

## 2024-03-11 VITALS — BP 122/69 | HR 55 | Resp 20 | Ht 71.0 in | Wt 213.0 lb

## 2024-03-11 DIAGNOSIS — M109 Gout, unspecified: Secondary | ICD-10-CM

## 2024-03-11 DIAGNOSIS — M170 Bilateral primary osteoarthritis of knee: Secondary | ICD-10-CM | POA: Diagnosis not present

## 2024-03-11 DIAGNOSIS — I10 Essential (primary) hypertension: Secondary | ICD-10-CM

## 2024-03-11 DIAGNOSIS — I4729 Other ventricular tachycardia: Secondary | ICD-10-CM | POA: Diagnosis not present

## 2024-03-11 MED ORDER — COLCHICINE 0.6 MG PO TABS: 0.6000 mg | ORAL_TABLET | Freq: Every day | ORAL | 1 refills | Status: AC

## 2024-03-11 MED ORDER — DICLOFENAC SODIUM 75 MG PO TBEC: 75.0000 mg | DELAYED_RELEASE_TABLET | Freq: Every day | ORAL | 1 refills | Status: AC | PRN

## 2024-03-11 MED ORDER — LOSARTAN POTASSIUM 25 MG PO TABS: ORAL_TABLET | ORAL | 3 refills | Status: AC

## 2024-03-11 NOTE — Progress Notes (Signed)
 Established Patient Office Visit  Subjective   Patient ID: Marc Krueger, male    DOB: [birth year]  Age: 68 y.o. MRN: [id number]  Chief Complaint  Patient presents with   Hypertension    HPI 68 year old male presents to the office for 6 month follow up for:  Hypertension: Patient is currently being managed with the following losartan  25 mg and metoprolol  succinate 25 mg daily. Patient is doing well on current regimen. Denies any side effects to medications.   Gout: Patient is currently taking colchicine  0.6 mg as needed for gout flare up. Patient states that he has not had a gout flare up in about 3 years.   Review of Systems  Constitutional: Negative.   HENT: Negative.    Eyes: Negative.   Respiratory: Negative.    Cardiovascular: Negative.   Gastrointestinal: Negative.   Genitourinary: Negative.   Musculoskeletal: Negative.   Skin: Negative.   Neurological: Negative.   Endo/Heme/Allergies: Negative.   Psychiatric/Behavioral: Negative.        Objective:     BP 122/69 (BP Location: Left Arm, Cuff Size: Large)   Pulse (!) 55   Resp 20   Ht 5' 11 (1.803 m)   Wt 96.6 kg   SpO2 98%   BMI 29.71 kg/m  BP Readings from Last 3 Encounters:  03/11/24 122/69  09/04/23 129/80  07/10/23 126/82   Wt Readings from Last 3 Encounters:  03/11/24 96.6 kg  09/04/23 93.9 kg  07/10/23 95.4 kg      Physical Exam Vitals and nursing note reviewed.  Constitutional:      General: He is not in acute distress.    Appearance: Normal appearance.  Cardiovascular:     Rate and Rhythm: Normal rate and regular rhythm.     Pulses: Normal pulses.     Heart sounds: Normal heart sounds.  Pulmonary:     Effort: Pulmonary effort is normal.     Breath sounds: Normal breath sounds.  Skin:    General: Skin is warm and dry.  Neurological:     General: No focal deficit present.     Mental Status: He is alert and oriented to person, place, and time.  Psychiatric:        Mood and Affect:  Mood normal.        Behavior: Behavior normal.        Thought Content: Thought content normal.        Judgment: Judgment normal.      No results found for any visits on 03/11/24.  Last metabolic panel Lab Results  Component Value Date   GLUCOSE 93 09/04/2023   NA 139 09/04/2023   K 4.1 09/04/2023   CL 102 09/04/2023   CO2 19 (L) 09/04/2023   BUN 16 09/04/2023   CREATININE 1.17 09/04/2023   EGFR 69 09/04/2023   CALCIUM 9.7 09/04/2023   PROT 7.2 09/04/2023   ALBUMIN 4.5 09/04/2023   LABGLOB 2.7 09/04/2023   AGRATIO 1.6 09/18/2021   BILITOT 1.3 (H) 09/04/2023   ALKPHOS 85 09/04/2023   AST 18 09/04/2023   ALT 15 09/04/2023   ANIONGAP 10 03/09/2021   Last lipids Lab Results  Component Value Date   CHOL 171 09/04/2023   HDL 51 09/04/2023   LDLCALC 105 (H) 09/04/2023   TRIG 79 09/04/2023   CHOLHDL 3.4 09/04/2023      The 10-year ASCVD risk score (Arnett DK, et al., 2019) is: 14.2%    Assessment & Plan:  1. Acute gout, unspecified cause, unspecified site -Doing well. Takes colchicine  0.6 mg as needed -Reports no gout flare ups in about 3 years - colchicine  0.6 MG tablet; Take 1 tablet (0.6 mg total) by mouth daily.  Dispense: 30 tablet; Refill: 1  2. NSVT (nonsustained ventricular tachycardia) (HCC) -Patient doing well on current regimen metoprolol  succinate 25 mg and losartan  25 mg daily -Refills sent to the pharmacy - metoprolol  succinate (TOPROL  XL) 25 MG 24 hr tablet; Take 0.5 tablets (12.5 mg total) by mouth daily.  Dispense: 45 tablet; Refill: 2    Return in about 6 months (around 09/09/2024).    Derrek JINNY Freund, NP Student

## 2024-03-11 NOTE — Progress Notes (Signed)
 Hypertension well-managed on current losartan  and Toprol -XL doses.  Recheck of blood pressure at 122/69, at goal.  Taking Voltaren  75 mg twice daily as needed however reports very sparing use and only when he has significantly increased activity.  Tolerates well and feels the medication works well for him.  Plan to continue as needed use.  Medical screening examination/treatment was performed by qualified clinical staff member and as supervising provider I was immediately available for consultation/collaboration. I have reviewed documentation and agree with assessment and plan.  Zada FREDRIK Palin, DNP, APRN, FNP-BC [facility]

## 2024-05-23 ENCOUNTER — Encounter: Payer: Self-pay | Admitting: Medical-Surgical

## 2024-05-27 ENCOUNTER — Ambulatory Visit: Admitting: Medical-Surgical

## 2024-05-27 ENCOUNTER — Encounter: Payer: Self-pay | Admitting: Medical-Surgical

## 2024-05-27 VITALS — BP 111/66 | HR 67 | Temp 99.3°F | Resp 16 | Ht 71.0 in | Wt 221.0 lb

## 2024-05-27 DIAGNOSIS — R21 Rash and other nonspecific skin eruption: Secondary | ICD-10-CM | POA: Diagnosis not present

## 2024-05-27 DIAGNOSIS — L308 Other specified dermatitis: Secondary | ICD-10-CM | POA: Diagnosis not present

## 2024-05-27 MED ORDER — CLOTRIMAZOLE-BETAMETHASONE 1-0.05 % EX CREA: 1.0000 | TOPICAL_CREAM | Freq: Two times a day (BID) | CUTANEOUS | 0 refills | Status: AC

## 2024-05-27 MED ORDER — PREDNISONE 50 MG PO TABS: 50.0000 mg | ORAL_TABLET | Freq: Every day | ORAL | 0 refills | Status: AC

## 2024-05-27 NOTE — Progress Notes (Signed)
" ° °       Established patient visit   History of Present Illness   Discussed the use of AI scribe software for clinical note transcription with the patient, who gave verbal consent to proceed.  History of Present Illness   Marc Krueger is a 69 year old male who presents with chronic itchy skin.  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 - Episodic itchy spots, such as one on the arm, which resolved after approximately one and a half weeks - No involvement of the legs  Cutaneous findings - Hyperpigmented macules observed on the back by his wife - Dermatology previously performed a biopsy of a papule - No recent changes in laundry detergents, soaps, or clothing  Prior treatments and interventions - Triamcinolone  cream prescribed by dermatology, used with unclear benefit - Use of lotion and a back scratcher for symptomatic relief - Recently discontinued use of an electric blanket due to concern for skin dryness  Allergy and supplement use - Allergy pills available at home but not taken regularly - No associated nasal or ocular symptoms - Initiated beetroot supplements approximately three weeks ago for blood pressure management     Physical Exam   Physical Exam Vitals and nursing note reviewed.  Constitutional:      General: He is not in acute distress.    Appearance: Normal appearance. He is not ill-appearing.  HENT:     Head: Normocephalic and atraumatic.  Cardiovascular:     Rate and Rhythm: Normal rate and regular rhythm.     Pulses: Normal pulses.     Heart sounds: Normal heart sounds. No murmur heard.    No friction rub. No gallop.  Pulmonary:     Effort: Pulmonary effort is normal. No respiratory distress.     Breath sounds: Normal breath sounds.  Skin:     General: Skin is warm and dry.     Findings: Rash present.  Neurological:     Mental Status: He is alert and oriented to person, place, and time.  Psychiatric:        Mood and Affect: Mood normal.        Behavior: Behavior normal.        Thought Content: Thought content normal.        Judgment: Judgment normal.         Assessment & Plan   Problem List Items Addressed This Visit   None Visit Diagnoses       Pruritic dermatitis    -  Primary     Rash          Assessment and Plan    Pruritic dermatitis Chronic dermatitis with recent exacerbation. Differential includes eczema and allergic reaction. No fungal infection on back/torso. Possible jock itch considered. Potential triggers: heat, clothing, allergies. - Prescribed Lotrisone  cream for affected areas in the groin/thigh. - Prescribed oral prednisone  50mg  daily for 5 days. - Advised use of moisturizers like Eucerin or Aquaphor post-shower. - Recommended trial of OTC allergy medication such as Claritin . - Continue follow-up with dermatology on January 20th.     Follow up   Return if symptoms worsen or fail to improve. __________________________________ Zada FREDRIK Palin, DNP, APRN, FNP-BC [facility] [place] "

## 2024-09-09 ENCOUNTER — Ambulatory Visit: Admitting: Medical-Surgical
